# Patient Record
Sex: Female | Born: 1945 | Race: White | Hispanic: No | Smoking: Former smoker
Health system: Southern US, Community
[De-identification: ages and names within clinical notes are randomized; demographics above are authoritative.]

## PROBLEM LIST (undated history)

## (undated) DIAGNOSIS — M109 Gout, unspecified: Secondary | ICD-10-CM

## (undated) DIAGNOSIS — Z8489 Family history of other specified conditions: Secondary | ICD-10-CM

## (undated) DIAGNOSIS — K219 Gastro-esophageal reflux disease without esophagitis: Secondary | ICD-10-CM

## (undated) DIAGNOSIS — E119 Type 2 diabetes mellitus without complications: Secondary | ICD-10-CM

## (undated) DIAGNOSIS — C801 Malignant (primary) neoplasm, unspecified: Secondary | ICD-10-CM

## (undated) DIAGNOSIS — N2889 Other specified disorders of kidney and ureter: Secondary | ICD-10-CM

## (undated) DIAGNOSIS — I509 Heart failure, unspecified: Secondary | ICD-10-CM

## (undated) DIAGNOSIS — R1310 Dysphagia, unspecified: Secondary | ICD-10-CM

## (undated) DIAGNOSIS — K7460 Unspecified cirrhosis of liver: Secondary | ICD-10-CM

## (undated) DIAGNOSIS — M1990 Unspecified osteoarthritis, unspecified site: Secondary | ICD-10-CM

## (undated) DIAGNOSIS — I10 Essential (primary) hypertension: Secondary | ICD-10-CM

## (undated) HISTORY — PX: TUBAL LIGATION: SHX77

## (undated) HISTORY — DX: Gastro-esophageal reflux disease without esophagitis: K21.9

## (undated) HISTORY — PX: CARPAL TUNNEL RELEASE: SHX101

## (undated) HISTORY — PX: ABDOMINAL HYSTERECTOMY: SHX81

## (undated) HISTORY — DX: Heart failure, unspecified: I50.9

## (undated) HISTORY — DX: Dysphagia, unspecified: R13.10

## (undated) HISTORY — DX: Unspecified cirrhosis of liver: K74.60

## (undated) HISTORY — PX: WISDOM TOOTH EXTRACTION: SHX21

## (undated) HISTORY — PX: TRIGGER FINGER RELEASE: SHX641

---

## 2013-02-12 DIAGNOSIS — R7309 Other abnormal glucose: Secondary | ICD-10-CM | POA: Diagnosis not present

## 2013-02-12 DIAGNOSIS — K219 Gastro-esophageal reflux disease without esophagitis: Secondary | ICD-10-CM | POA: Diagnosis not present

## 2013-02-12 DIAGNOSIS — R072 Precordial pain: Secondary | ICD-10-CM | POA: Diagnosis not present

## 2013-02-12 DIAGNOSIS — Z833 Family history of diabetes mellitus: Secondary | ICD-10-CM | POA: Diagnosis not present

## 2013-02-12 DIAGNOSIS — E119 Type 2 diabetes mellitus without complications: Secondary | ICD-10-CM | POA: Insufficient documentation

## 2013-02-12 DIAGNOSIS — Z87891 Personal history of nicotine dependence: Secondary | ICD-10-CM | POA: Diagnosis not present

## 2013-02-12 DIAGNOSIS — Z79899 Other long term (current) drug therapy: Secondary | ICD-10-CM | POA: Diagnosis not present

## 2013-02-12 DIAGNOSIS — I77810 Thoracic aortic ectasia: Secondary | ICD-10-CM | POA: Diagnosis not present

## 2013-02-12 DIAGNOSIS — J9819 Other pulmonary collapse: Secondary | ICD-10-CM | POA: Diagnosis not present

## 2013-02-12 DIAGNOSIS — E785 Hyperlipidemia, unspecified: Secondary | ICD-10-CM | POA: Diagnosis present

## 2013-02-12 DIAGNOSIS — I517 Cardiomegaly: Secondary | ICD-10-CM | POA: Diagnosis not present

## 2013-02-12 DIAGNOSIS — R079 Chest pain, unspecified: Secondary | ICD-10-CM | POA: Diagnosis not present

## 2013-02-12 DIAGNOSIS — R109 Unspecified abdominal pain: Secondary | ICD-10-CM | POA: Diagnosis not present

## 2013-02-12 DIAGNOSIS — E876 Hypokalemia: Secondary | ICD-10-CM | POA: Diagnosis not present

## 2013-02-12 DIAGNOSIS — I200 Unstable angina: Secondary | ICD-10-CM | POA: Diagnosis not present

## 2013-02-12 DIAGNOSIS — R0789 Other chest pain: Secondary | ICD-10-CM | POA: Diagnosis not present

## 2013-02-12 DIAGNOSIS — Z888 Allergy status to other drugs, medicaments and biological substances status: Secondary | ICD-10-CM | POA: Diagnosis not present

## 2013-02-12 DIAGNOSIS — M109 Gout, unspecified: Secondary | ICD-10-CM | POA: Diagnosis not present

## 2013-02-12 DIAGNOSIS — I10 Essential (primary) hypertension: Secondary | ICD-10-CM | POA: Diagnosis not present

## 2013-02-12 DIAGNOSIS — Z8249 Family history of ischemic heart disease and other diseases of the circulatory system: Secondary | ICD-10-CM | POA: Diagnosis not present

## 2013-02-13 DIAGNOSIS — Z9889 Other specified postprocedural states: Secondary | ICD-10-CM | POA: Insufficient documentation

## 2013-02-21 DIAGNOSIS — IMO0002 Reserved for concepts with insufficient information to code with codable children: Secondary | ICD-10-CM | POA: Diagnosis not present

## 2013-02-21 DIAGNOSIS — Z79899 Other long term (current) drug therapy: Secondary | ICD-10-CM | POA: Diagnosis not present

## 2013-02-21 DIAGNOSIS — I10 Essential (primary) hypertension: Secondary | ICD-10-CM | POA: Diagnosis not present

## 2013-02-21 DIAGNOSIS — M109 Gout, unspecified: Secondary | ICD-10-CM | POA: Diagnosis not present

## 2013-02-21 DIAGNOSIS — E782 Mixed hyperlipidemia: Secondary | ICD-10-CM | POA: Diagnosis not present

## 2013-02-21 DIAGNOSIS — M129 Arthropathy, unspecified: Secondary | ICD-10-CM | POA: Diagnosis not present

## 2013-02-21 DIAGNOSIS — M79609 Pain in unspecified limb: Secondary | ICD-10-CM | POA: Diagnosis not present

## 2013-02-21 DIAGNOSIS — R7309 Other abnormal glucose: Secondary | ICD-10-CM | POA: Diagnosis not present

## 2013-03-07 ENCOUNTER — Encounter (HOSPITAL_COMMUNITY): Payer: Self-pay | Admitting: Dietician

## 2013-03-07 NOTE — Progress Notes (Signed)
[facility] Diabetes Class Completion  Date:March 07, 2013  Time: 5:30 PM  Pt attended [facility]'s Diabetes [facility] Education Class on March 07, 2013.   Patient was educated on the following topics: survival skills (signs and symptoms of hyperglycemia and hypoglycemia, treatment for hypoglycemia, ideal levels for fasting and postprandial blood sugars, goal Hgb A1c level, foot care basics), recommendations for physical activity, carbohydrate metabolism in relation to diabetes, and meal planning (sources of carbohydrate, carbohydrate counting, meal planning strategies, food label reading, and portion control).   Jenifer A. Kayan, RD, LDN   

## 2013-03-27 DIAGNOSIS — I10 Essential (primary) hypertension: Secondary | ICD-10-CM | POA: Diagnosis not present

## 2013-03-27 DIAGNOSIS — R002 Palpitations: Secondary | ICD-10-CM | POA: Diagnosis not present

## 2013-07-08 DIAGNOSIS — E782 Mixed hyperlipidemia: Secondary | ICD-10-CM | POA: Diagnosis not present

## 2013-07-08 DIAGNOSIS — I10 Essential (primary) hypertension: Secondary | ICD-10-CM | POA: Diagnosis not present

## 2013-07-11 DIAGNOSIS — M109 Gout, unspecified: Secondary | ICD-10-CM | POA: Diagnosis not present

## 2013-07-11 DIAGNOSIS — I10 Essential (primary) hypertension: Secondary | ICD-10-CM | POA: Diagnosis not present

## 2013-07-11 DIAGNOSIS — IMO0002 Reserved for concepts with insufficient information to code with codable children: Secondary | ICD-10-CM | POA: Diagnosis not present

## 2013-07-11 DIAGNOSIS — E782 Mixed hyperlipidemia: Secondary | ICD-10-CM | POA: Diagnosis not present

## 2013-07-11 DIAGNOSIS — M79609 Pain in unspecified limb: Secondary | ICD-10-CM | POA: Diagnosis not present

## 2013-07-11 DIAGNOSIS — E119 Type 2 diabetes mellitus without complications: Secondary | ICD-10-CM | POA: Diagnosis not present

## 2013-07-18 DIAGNOSIS — Z1231 Encounter for screening mammogram for malignant neoplasm of breast: Secondary | ICD-10-CM | POA: Diagnosis not present

## 2013-10-07 DIAGNOSIS — L280 Lichen simplex chronicus: Secondary | ICD-10-CM | POA: Diagnosis not present

## 2013-10-07 DIAGNOSIS — L821 Other seborrheic keratosis: Secondary | ICD-10-CM | POA: Diagnosis not present

## 2013-10-07 DIAGNOSIS — D485 Neoplasm of uncertain behavior of skin: Secondary | ICD-10-CM | POA: Diagnosis not present

## 2013-10-07 DIAGNOSIS — L259 Unspecified contact dermatitis, unspecified cause: Secondary | ICD-10-CM | POA: Diagnosis not present

## 2013-10-07 DIAGNOSIS — L570 Actinic keratosis: Secondary | ICD-10-CM | POA: Diagnosis not present

## 2013-10-18 DIAGNOSIS — Z23 Encounter for immunization: Secondary | ICD-10-CM | POA: Diagnosis not present

## 2013-11-08 DIAGNOSIS — E119 Type 2 diabetes mellitus without complications: Secondary | ICD-10-CM | POA: Diagnosis not present

## 2013-11-08 DIAGNOSIS — IMO0002 Reserved for concepts with insufficient information to code with codable children: Secondary | ICD-10-CM | POA: Diagnosis not present

## 2013-11-08 DIAGNOSIS — E782 Mixed hyperlipidemia: Secondary | ICD-10-CM | POA: Diagnosis not present

## 2013-11-08 DIAGNOSIS — I10 Essential (primary) hypertension: Secondary | ICD-10-CM | POA: Diagnosis not present

## 2013-11-19 DIAGNOSIS — Z23 Encounter for immunization: Secondary | ICD-10-CM | POA: Diagnosis not present

## 2013-11-19 DIAGNOSIS — M109 Gout, unspecified: Secondary | ICD-10-CM | POA: Diagnosis not present

## 2013-11-19 DIAGNOSIS — E782 Mixed hyperlipidemia: Secondary | ICD-10-CM | POA: Diagnosis not present

## 2013-11-19 DIAGNOSIS — I10 Essential (primary) hypertension: Secondary | ICD-10-CM | POA: Diagnosis not present

## 2013-11-19 DIAGNOSIS — IMO0002 Reserved for concepts with insufficient information to code with codable children: Secondary | ICD-10-CM | POA: Diagnosis not present

## 2013-11-19 DIAGNOSIS — E119 Type 2 diabetes mellitus without complications: Secondary | ICD-10-CM | POA: Diagnosis not present

## 2013-11-19 DIAGNOSIS — Z1331 Encounter for screening for depression: Secondary | ICD-10-CM | POA: Diagnosis not present

## 2013-11-19 DIAGNOSIS — M6530 Trigger finger, unspecified finger: Secondary | ICD-10-CM | POA: Diagnosis not present

## 2014-01-18 DIAGNOSIS — R300 Dysuria: Secondary | ICD-10-CM | POA: Diagnosis not present

## 2014-01-18 DIAGNOSIS — N3000 Acute cystitis without hematuria: Secondary | ICD-10-CM | POA: Diagnosis not present

## 2014-01-18 DIAGNOSIS — R109 Unspecified abdominal pain: Secondary | ICD-10-CM | POA: Diagnosis not present

## 2014-02-10 DIAGNOSIS — Z1211 Encounter for screening for malignant neoplasm of colon: Secondary | ICD-10-CM | POA: Diagnosis not present

## 2014-02-20 DIAGNOSIS — Z888 Allergy status to other drugs, medicaments and biological substances status: Secondary | ICD-10-CM | POA: Diagnosis not present

## 2014-02-20 DIAGNOSIS — Z885 Allergy status to narcotic agent status: Secondary | ICD-10-CM | POA: Diagnosis not present

## 2014-02-20 DIAGNOSIS — Z1211 Encounter for screening for malignant neoplasm of colon: Secondary | ICD-10-CM | POA: Diagnosis not present

## 2014-02-20 DIAGNOSIS — Z882 Allergy status to sulfonamides status: Secondary | ICD-10-CM | POA: Diagnosis not present

## 2014-02-20 DIAGNOSIS — K644 Residual hemorrhoidal skin tags: Secondary | ICD-10-CM | POA: Diagnosis not present

## 2014-02-20 DIAGNOSIS — Z79899 Other long term (current) drug therapy: Secondary | ICD-10-CM | POA: Diagnosis not present

## 2014-02-20 DIAGNOSIS — I10 Essential (primary) hypertension: Secondary | ICD-10-CM | POA: Diagnosis not present

## 2014-02-20 DIAGNOSIS — D126 Benign neoplasm of colon, unspecified: Secondary | ICD-10-CM | POA: Diagnosis not present

## 2014-02-20 DIAGNOSIS — Q438 Other specified congenital malformations of intestine: Secondary | ICD-10-CM | POA: Diagnosis not present

## 2014-02-20 DIAGNOSIS — E119 Type 2 diabetes mellitus without complications: Secondary | ICD-10-CM | POA: Diagnosis not present

## 2014-02-20 DIAGNOSIS — M109 Gout, unspecified: Secondary | ICD-10-CM | POA: Diagnosis not present

## 2014-02-20 DIAGNOSIS — K5730 Diverticulosis of large intestine without perforation or abscess without bleeding: Secondary | ICD-10-CM | POA: Diagnosis not present

## 2014-03-25 DIAGNOSIS — Z1331 Encounter for screening for depression: Secondary | ICD-10-CM | POA: Diagnosis not present

## 2014-03-25 DIAGNOSIS — E119 Type 2 diabetes mellitus without complications: Secondary | ICD-10-CM | POA: Diagnosis not present

## 2014-03-25 DIAGNOSIS — IMO0002 Reserved for concepts with insufficient information to code with codable children: Secondary | ICD-10-CM | POA: Diagnosis not present

## 2014-03-25 DIAGNOSIS — I10 Essential (primary) hypertension: Secondary | ICD-10-CM | POA: Diagnosis not present

## 2014-03-25 DIAGNOSIS — M109 Gout, unspecified: Secondary | ICD-10-CM | POA: Diagnosis not present

## 2014-03-25 DIAGNOSIS — Z23 Encounter for immunization: Secondary | ICD-10-CM | POA: Diagnosis not present

## 2014-03-25 DIAGNOSIS — E782 Mixed hyperlipidemia: Secondary | ICD-10-CM | POA: Diagnosis not present

## 2014-03-25 DIAGNOSIS — Z0000 Encounter for general adult medical examination without abnormal findings: Secondary | ICD-10-CM | POA: Diagnosis not present

## 2014-03-25 DIAGNOSIS — M6530 Trigger finger, unspecified finger: Secondary | ICD-10-CM | POA: Diagnosis not present

## 2014-05-31 DIAGNOSIS — R300 Dysuria: Secondary | ICD-10-CM | POA: Diagnosis not present

## 2014-08-01 DIAGNOSIS — Z1231 Encounter for screening mammogram for malignant neoplasm of breast: Secondary | ICD-10-CM | POA: Diagnosis not present

## 2014-08-07 DIAGNOSIS — L259 Unspecified contact dermatitis, unspecified cause: Secondary | ICD-10-CM | POA: Diagnosis not present

## 2014-08-07 DIAGNOSIS — I10 Essential (primary) hypertension: Secondary | ICD-10-CM | POA: Diagnosis not present

## 2014-08-11 DIAGNOSIS — E782 Mixed hyperlipidemia: Secondary | ICD-10-CM | POA: Diagnosis not present

## 2014-08-11 DIAGNOSIS — N3000 Acute cystitis without hematuria: Secondary | ICD-10-CM | POA: Diagnosis not present

## 2014-08-11 DIAGNOSIS — E119 Type 2 diabetes mellitus without complications: Secondary | ICD-10-CM | POA: Diagnosis not present

## 2014-08-11 DIAGNOSIS — I10 Essential (primary) hypertension: Secondary | ICD-10-CM | POA: Diagnosis not present

## 2014-08-19 DIAGNOSIS — E119 Type 2 diabetes mellitus without complications: Secondary | ICD-10-CM | POA: Diagnosis not present

## 2014-08-19 DIAGNOSIS — E782 Mixed hyperlipidemia: Secondary | ICD-10-CM | POA: Diagnosis not present

## 2014-08-19 DIAGNOSIS — Z0000 Encounter for general adult medical examination without abnormal findings: Secondary | ICD-10-CM | POA: Diagnosis not present

## 2014-08-19 DIAGNOSIS — I10 Essential (primary) hypertension: Secondary | ICD-10-CM | POA: Diagnosis not present

## 2014-08-19 DIAGNOSIS — Z1331 Encounter for screening for depression: Secondary | ICD-10-CM | POA: Diagnosis not present

## 2014-08-19 DIAGNOSIS — IMO0002 Reserved for concepts with insufficient information to code with codable children: Secondary | ICD-10-CM | POA: Diagnosis not present

## 2014-08-19 DIAGNOSIS — M109 Gout, unspecified: Secondary | ICD-10-CM | POA: Diagnosis not present

## 2014-09-11 DIAGNOSIS — M899 Disorder of bone, unspecified: Secondary | ICD-10-CM | POA: Diagnosis not present

## 2014-09-11 DIAGNOSIS — M949 Disorder of cartilage, unspecified: Secondary | ICD-10-CM | POA: Diagnosis not present

## 2014-09-11 DIAGNOSIS — Z780 Asymptomatic menopausal state: Secondary | ICD-10-CM | POA: Diagnosis not present

## 2014-10-07 DIAGNOSIS — L280 Lichen simplex chronicus: Secondary | ICD-10-CM | POA: Diagnosis not present

## 2014-10-07 DIAGNOSIS — L570 Actinic keratosis: Secondary | ICD-10-CM | POA: Diagnosis not present

## 2014-12-17 DIAGNOSIS — E782 Mixed hyperlipidemia: Secondary | ICD-10-CM | POA: Diagnosis not present

## 2014-12-17 DIAGNOSIS — I10 Essential (primary) hypertension: Secondary | ICD-10-CM | POA: Diagnosis not present

## 2014-12-17 DIAGNOSIS — N3090 Cystitis, unspecified without hematuria: Secondary | ICD-10-CM | POA: Diagnosis not present

## 2014-12-17 DIAGNOSIS — E119 Type 2 diabetes mellitus without complications: Secondary | ICD-10-CM | POA: Diagnosis not present

## 2014-12-24 DIAGNOSIS — E782 Mixed hyperlipidemia: Secondary | ICD-10-CM | POA: Diagnosis not present

## 2014-12-24 DIAGNOSIS — M1000 Idiopathic gout, unspecified site: Secondary | ICD-10-CM | POA: Diagnosis not present

## 2014-12-24 DIAGNOSIS — Z1389 Encounter for screening for other disorder: Secondary | ICD-10-CM | POA: Diagnosis not present

## 2014-12-24 DIAGNOSIS — G43909 Migraine, unspecified, not intractable, without status migrainosus: Secondary | ICD-10-CM | POA: Diagnosis not present

## 2014-12-24 DIAGNOSIS — Z23 Encounter for immunization: Secondary | ICD-10-CM | POA: Diagnosis not present

## 2014-12-24 DIAGNOSIS — F331 Major depressive disorder, recurrent, moderate: Secondary | ICD-10-CM | POA: Diagnosis not present

## 2014-12-24 DIAGNOSIS — I10 Essential (primary) hypertension: Secondary | ICD-10-CM | POA: Diagnosis not present

## 2014-12-24 DIAGNOSIS — E119 Type 2 diabetes mellitus without complications: Secondary | ICD-10-CM | POA: Diagnosis not present

## 2015-02-06 DIAGNOSIS — J019 Acute sinusitis, unspecified: Secondary | ICD-10-CM | POA: Diagnosis not present

## 2015-04-15 DIAGNOSIS — F331 Major depressive disorder, recurrent, moderate: Secondary | ICD-10-CM | POA: Diagnosis not present

## 2015-04-15 DIAGNOSIS — I10 Essential (primary) hypertension: Secondary | ICD-10-CM | POA: Diagnosis not present

## 2015-04-15 DIAGNOSIS — E782 Mixed hyperlipidemia: Secondary | ICD-10-CM | POA: Diagnosis not present

## 2015-04-15 DIAGNOSIS — E119 Type 2 diabetes mellitus without complications: Secondary | ICD-10-CM | POA: Diagnosis not present

## 2015-04-15 DIAGNOSIS — N3090 Cystitis, unspecified without hematuria: Secondary | ICD-10-CM | POA: Diagnosis not present

## 2015-04-22 DIAGNOSIS — F331 Major depressive disorder, recurrent, moderate: Secondary | ICD-10-CM | POA: Diagnosis not present

## 2015-04-22 DIAGNOSIS — R202 Paresthesia of skin: Secondary | ICD-10-CM | POA: Diagnosis not present

## 2015-04-22 DIAGNOSIS — M1000 Idiopathic gout, unspecified site: Secondary | ICD-10-CM | POA: Diagnosis not present

## 2015-04-22 DIAGNOSIS — E119 Type 2 diabetes mellitus without complications: Secondary | ICD-10-CM | POA: Diagnosis not present

## 2015-04-22 DIAGNOSIS — I10 Essential (primary) hypertension: Secondary | ICD-10-CM | POA: Diagnosis not present

## 2015-04-22 DIAGNOSIS — G43909 Migraine, unspecified, not intractable, without status migrainosus: Secondary | ICD-10-CM | POA: Diagnosis not present

## 2015-04-22 DIAGNOSIS — E782 Mixed hyperlipidemia: Secondary | ICD-10-CM | POA: Diagnosis not present

## 2015-04-28 DIAGNOSIS — E119 Type 2 diabetes mellitus without complications: Secondary | ICD-10-CM | POA: Diagnosis not present

## 2015-04-28 DIAGNOSIS — R202 Paresthesia of skin: Secondary | ICD-10-CM | POA: Diagnosis not present

## 2015-04-28 DIAGNOSIS — Z9851 Tubal ligation status: Secondary | ICD-10-CM | POA: Diagnosis not present

## 2015-04-28 DIAGNOSIS — Z90710 Acquired absence of both cervix and uterus: Secondary | ICD-10-CM | POA: Diagnosis not present

## 2015-04-28 DIAGNOSIS — G9389 Other specified disorders of brain: Secondary | ICD-10-CM | POA: Diagnosis not present

## 2015-04-28 DIAGNOSIS — R200 Anesthesia of skin: Secondary | ICD-10-CM | POA: Diagnosis not present

## 2015-04-28 DIAGNOSIS — J9811 Atelectasis: Secondary | ICD-10-CM | POA: Diagnosis not present

## 2015-04-28 DIAGNOSIS — M109 Gout, unspecified: Secondary | ICD-10-CM | POA: Diagnosis not present

## 2015-04-28 DIAGNOSIS — F419 Anxiety disorder, unspecified: Secondary | ICD-10-CM | POA: Diagnosis not present

## 2015-04-28 DIAGNOSIS — Z79899 Other long term (current) drug therapy: Secondary | ICD-10-CM | POA: Diagnosis not present

## 2015-04-28 DIAGNOSIS — R29810 Facial weakness: Secondary | ICD-10-CM | POA: Diagnosis not present

## 2015-04-28 DIAGNOSIS — I708 Atherosclerosis of other arteries: Secondary | ICD-10-CM | POA: Diagnosis not present

## 2015-04-28 DIAGNOSIS — J986 Disorders of diaphragm: Secondary | ICD-10-CM | POA: Diagnosis not present

## 2015-04-28 DIAGNOSIS — R1310 Dysphagia, unspecified: Secondary | ICD-10-CM | POA: Diagnosis not present

## 2015-04-28 DIAGNOSIS — I10 Essential (primary) hypertension: Secondary | ICD-10-CM | POA: Diagnosis not present

## 2015-04-30 DIAGNOSIS — F331 Major depressive disorder, recurrent, moderate: Secondary | ICD-10-CM | POA: Diagnosis not present

## 2015-05-05 DIAGNOSIS — I6522 Occlusion and stenosis of left carotid artery: Secondary | ICD-10-CM | POA: Diagnosis not present

## 2015-05-05 DIAGNOSIS — E119 Type 2 diabetes mellitus without complications: Secondary | ICD-10-CM | POA: Diagnosis not present

## 2015-05-05 DIAGNOSIS — I10 Essential (primary) hypertension: Secondary | ICD-10-CM | POA: Diagnosis not present

## 2015-05-05 DIAGNOSIS — R200 Anesthesia of skin: Secondary | ICD-10-CM | POA: Diagnosis not present

## 2015-05-13 DIAGNOSIS — I071 Rheumatic tricuspid insufficiency: Secondary | ICD-10-CM | POA: Diagnosis not present

## 2015-05-13 DIAGNOSIS — I361 Nonrheumatic tricuspid (valve) insufficiency: Secondary | ICD-10-CM | POA: Diagnosis not present

## 2015-05-13 DIAGNOSIS — I340 Nonrheumatic mitral (valve) insufficiency: Secondary | ICD-10-CM | POA: Diagnosis not present

## 2015-05-13 DIAGNOSIS — R200 Anesthesia of skin: Secondary | ICD-10-CM | POA: Diagnosis not present

## 2015-05-13 DIAGNOSIS — R931 Abnormal findings on diagnostic imaging of heart and coronary circulation: Secondary | ICD-10-CM | POA: Diagnosis not present

## 2015-05-13 DIAGNOSIS — R51 Headache: Secondary | ICD-10-CM | POA: Diagnosis not present

## 2015-05-13 DIAGNOSIS — I358 Other nonrheumatic aortic valve disorders: Secondary | ICD-10-CM | POA: Diagnosis not present

## 2015-05-20 DIAGNOSIS — I10 Essential (primary) hypertension: Secondary | ICD-10-CM | POA: Diagnosis not present

## 2015-05-20 DIAGNOSIS — G43909 Migraine, unspecified, not intractable, without status migrainosus: Secondary | ICD-10-CM | POA: Diagnosis not present

## 2015-05-20 DIAGNOSIS — F331 Major depressive disorder, recurrent, moderate: Secondary | ICD-10-CM | POA: Diagnosis not present

## 2015-05-20 DIAGNOSIS — E782 Mixed hyperlipidemia: Secondary | ICD-10-CM | POA: Diagnosis not present

## 2015-08-21 DIAGNOSIS — F331 Major depressive disorder, recurrent, moderate: Secondary | ICD-10-CM | POA: Diagnosis not present

## 2015-08-21 DIAGNOSIS — E782 Mixed hyperlipidemia: Secondary | ICD-10-CM | POA: Diagnosis not present

## 2015-08-21 DIAGNOSIS — N3090 Cystitis, unspecified without hematuria: Secondary | ICD-10-CM | POA: Diagnosis not present

## 2015-08-21 DIAGNOSIS — I10 Essential (primary) hypertension: Secondary | ICD-10-CM | POA: Diagnosis not present

## 2015-08-21 DIAGNOSIS — E119 Type 2 diabetes mellitus without complications: Secondary | ICD-10-CM | POA: Diagnosis not present

## 2015-08-26 DIAGNOSIS — Z1389 Encounter for screening for other disorder: Secondary | ICD-10-CM | POA: Diagnosis not present

## 2015-08-26 DIAGNOSIS — E119 Type 2 diabetes mellitus without complications: Secondary | ICD-10-CM | POA: Diagnosis not present

## 2015-08-26 DIAGNOSIS — I10 Essential (primary) hypertension: Secondary | ICD-10-CM | POA: Diagnosis not present

## 2015-08-26 DIAGNOSIS — Z23 Encounter for immunization: Secondary | ICD-10-CM | POA: Diagnosis not present

## 2015-08-26 DIAGNOSIS — Z0001 Encounter for general adult medical examination with abnormal findings: Secondary | ICD-10-CM | POA: Diagnosis not present

## 2015-08-26 DIAGNOSIS — G43909 Migraine, unspecified, not intractable, without status migrainosus: Secondary | ICD-10-CM | POA: Diagnosis not present

## 2015-08-26 DIAGNOSIS — E782 Mixed hyperlipidemia: Secondary | ICD-10-CM | POA: Diagnosis not present

## 2015-08-26 DIAGNOSIS — F331 Major depressive disorder, recurrent, moderate: Secondary | ICD-10-CM | POA: Diagnosis not present

## 2015-09-01 DIAGNOSIS — Z1231 Encounter for screening mammogram for malignant neoplasm of breast: Secondary | ICD-10-CM | POA: Diagnosis not present

## 2015-10-06 DIAGNOSIS — L280 Lichen simplex chronicus: Secondary | ICD-10-CM | POA: Diagnosis not present

## 2015-10-06 DIAGNOSIS — L570 Actinic keratosis: Secondary | ICD-10-CM | POA: Diagnosis not present

## 2015-12-31 DIAGNOSIS — E119 Type 2 diabetes mellitus without complications: Secondary | ICD-10-CM | POA: Diagnosis not present

## 2015-12-31 DIAGNOSIS — I10 Essential (primary) hypertension: Secondary | ICD-10-CM | POA: Diagnosis not present

## 2015-12-31 DIAGNOSIS — E782 Mixed hyperlipidemia: Secondary | ICD-10-CM | POA: Diagnosis not present

## 2016-01-04 DIAGNOSIS — Z9189 Other specified personal risk factors, not elsewhere classified: Secondary | ICD-10-CM | POA: Diagnosis not present

## 2016-01-04 DIAGNOSIS — M1000 Idiopathic gout, unspecified site: Secondary | ICD-10-CM | POA: Diagnosis not present

## 2016-01-04 DIAGNOSIS — E782 Mixed hyperlipidemia: Secondary | ICD-10-CM | POA: Diagnosis not present

## 2016-01-04 DIAGNOSIS — K7581 Nonalcoholic steatohepatitis (NASH): Secondary | ICD-10-CM | POA: Diagnosis not present

## 2016-01-04 DIAGNOSIS — G43909 Migraine, unspecified, not intractable, without status migrainosus: Secondary | ICD-10-CM | POA: Diagnosis not present

## 2016-01-04 DIAGNOSIS — F331 Major depressive disorder, recurrent, moderate: Secondary | ICD-10-CM | POA: Diagnosis not present

## 2016-01-04 DIAGNOSIS — E119 Type 2 diabetes mellitus without complications: Secondary | ICD-10-CM | POA: Diagnosis not present

## 2016-01-04 DIAGNOSIS — Z1389 Encounter for screening for other disorder: Secondary | ICD-10-CM | POA: Diagnosis not present

## 2016-01-04 DIAGNOSIS — I10 Essential (primary) hypertension: Secondary | ICD-10-CM | POA: Diagnosis not present

## 2016-04-25 DIAGNOSIS — R062 Wheezing: Secondary | ICD-10-CM | POA: Diagnosis not present

## 2016-04-25 DIAGNOSIS — R05 Cough: Secondary | ICD-10-CM | POA: Diagnosis not present

## 2016-09-02 DIAGNOSIS — F331 Major depressive disorder, recurrent, moderate: Secondary | ICD-10-CM | POA: Diagnosis not present

## 2016-09-02 DIAGNOSIS — K7581 Nonalcoholic steatohepatitis (NASH): Secondary | ICD-10-CM | POA: Diagnosis not present

## 2016-09-02 DIAGNOSIS — I10 Essential (primary) hypertension: Secondary | ICD-10-CM | POA: Diagnosis not present

## 2016-09-02 DIAGNOSIS — M1000 Idiopathic gout, unspecified site: Secondary | ICD-10-CM | POA: Diagnosis not present

## 2016-09-02 DIAGNOSIS — E119 Type 2 diabetes mellitus without complications: Secondary | ICD-10-CM | POA: Diagnosis not present

## 2016-09-02 DIAGNOSIS — E782 Mixed hyperlipidemia: Secondary | ICD-10-CM | POA: Diagnosis not present

## 2016-09-06 DIAGNOSIS — Z1212 Encounter for screening for malignant neoplasm of rectum: Secondary | ICD-10-CM | POA: Diagnosis not present

## 2016-09-06 DIAGNOSIS — M1000 Idiopathic gout, unspecified site: Secondary | ICD-10-CM | POA: Diagnosis not present

## 2016-09-06 DIAGNOSIS — E782 Mixed hyperlipidemia: Secondary | ICD-10-CM | POA: Diagnosis not present

## 2016-09-06 DIAGNOSIS — Z6828 Body mass index (BMI) 28.0-28.9, adult: Secondary | ICD-10-CM | POA: Diagnosis not present

## 2016-09-06 DIAGNOSIS — F331 Major depressive disorder, recurrent, moderate: Secondary | ICD-10-CM | POA: Diagnosis not present

## 2016-09-06 DIAGNOSIS — Z0001 Encounter for general adult medical examination with abnormal findings: Secondary | ICD-10-CM | POA: Diagnosis not present

## 2016-09-06 DIAGNOSIS — Z23 Encounter for immunization: Secondary | ICD-10-CM | POA: Diagnosis not present

## 2016-09-06 DIAGNOSIS — E119 Type 2 diabetes mellitus without complications: Secondary | ICD-10-CM | POA: Diagnosis not present

## 2016-09-08 DIAGNOSIS — R921 Mammographic calcification found on diagnostic imaging of breast: Secondary | ICD-10-CM | POA: Diagnosis not present

## 2016-09-08 DIAGNOSIS — Z1231 Encounter for screening mammogram for malignant neoplasm of breast: Secondary | ICD-10-CM | POA: Diagnosis not present

## 2016-09-28 DIAGNOSIS — R922 Inconclusive mammogram: Secondary | ICD-10-CM | POA: Diagnosis not present

## 2016-09-28 DIAGNOSIS — R921 Mammographic calcification found on diagnostic imaging of breast: Secondary | ICD-10-CM | POA: Diagnosis not present

## 2016-10-05 DIAGNOSIS — L570 Actinic keratosis: Secondary | ICD-10-CM | POA: Diagnosis not present

## 2016-10-05 DIAGNOSIS — L710 Perioral dermatitis: Secondary | ICD-10-CM | POA: Diagnosis not present

## 2016-10-24 DIAGNOSIS — Z6828 Body mass index (BMI) 28.0-28.9, adult: Secondary | ICD-10-CM | POA: Diagnosis not present

## 2016-10-24 DIAGNOSIS — M7552 Bursitis of left shoulder: Secondary | ICD-10-CM | POA: Diagnosis not present

## 2016-10-26 DIAGNOSIS — E119 Type 2 diabetes mellitus without complications: Secondary | ICD-10-CM | POA: Diagnosis not present

## 2016-10-26 DIAGNOSIS — E7800 Pure hypercholesterolemia, unspecified: Secondary | ICD-10-CM | POA: Diagnosis not present

## 2016-10-26 DIAGNOSIS — M85852 Other specified disorders of bone density and structure, left thigh: Secondary | ICD-10-CM | POA: Diagnosis not present

## 2016-10-26 DIAGNOSIS — Z780 Asymptomatic menopausal state: Secondary | ICD-10-CM | POA: Diagnosis not present

## 2016-10-26 DIAGNOSIS — I10 Essential (primary) hypertension: Secondary | ICD-10-CM | POA: Diagnosis not present

## 2016-10-26 DIAGNOSIS — M109 Gout, unspecified: Secondary | ICD-10-CM | POA: Diagnosis not present

## 2016-10-26 DIAGNOSIS — M85851 Other specified disorders of bone density and structure, right thigh: Secondary | ICD-10-CM | POA: Diagnosis not present

## 2016-10-26 DIAGNOSIS — Z79899 Other long term (current) drug therapy: Secondary | ICD-10-CM | POA: Diagnosis not present

## 2016-10-26 DIAGNOSIS — Z7984 Long term (current) use of oral hypoglycemic drugs: Secondary | ICD-10-CM | POA: Diagnosis not present

## 2016-11-03 DIAGNOSIS — M25512 Pain in left shoulder: Secondary | ICD-10-CM | POA: Diagnosis not present

## 2016-11-07 DIAGNOSIS — M25512 Pain in left shoulder: Secondary | ICD-10-CM | POA: Diagnosis not present

## 2016-11-09 DIAGNOSIS — M25512 Pain in left shoulder: Secondary | ICD-10-CM | POA: Diagnosis not present

## 2016-11-14 DIAGNOSIS — M25512 Pain in left shoulder: Secondary | ICD-10-CM | POA: Diagnosis not present

## 2016-11-16 DIAGNOSIS — M25512 Pain in left shoulder: Secondary | ICD-10-CM | POA: Diagnosis not present

## 2016-11-21 DIAGNOSIS — M25512 Pain in left shoulder: Secondary | ICD-10-CM | POA: Diagnosis not present

## 2016-11-23 DIAGNOSIS — M25512 Pain in left shoulder: Secondary | ICD-10-CM | POA: Diagnosis not present

## 2016-11-25 DIAGNOSIS — M25512 Pain in left shoulder: Secondary | ICD-10-CM | POA: Diagnosis not present

## 2016-11-28 DIAGNOSIS — M25512 Pain in left shoulder: Secondary | ICD-10-CM | POA: Diagnosis not present

## 2016-11-30 DIAGNOSIS — I10 Essential (primary) hypertension: Secondary | ICD-10-CM | POA: Diagnosis not present

## 2016-11-30 DIAGNOSIS — M7552 Bursitis of left shoulder: Secondary | ICD-10-CM | POA: Diagnosis not present

## 2016-11-30 DIAGNOSIS — M25512 Pain in left shoulder: Secondary | ICD-10-CM | POA: Diagnosis not present

## 2016-11-30 DIAGNOSIS — Z6828 Body mass index (BMI) 28.0-28.9, adult: Secondary | ICD-10-CM | POA: Diagnosis not present

## 2016-12-02 DIAGNOSIS — M25512 Pain in left shoulder: Secondary | ICD-10-CM | POA: Diagnosis not present

## 2016-12-07 DIAGNOSIS — M25512 Pain in left shoulder: Secondary | ICD-10-CM | POA: Diagnosis not present

## 2016-12-09 DIAGNOSIS — M25512 Pain in left shoulder: Secondary | ICD-10-CM | POA: Diagnosis not present

## 2016-12-13 DIAGNOSIS — M25512 Pain in left shoulder: Secondary | ICD-10-CM | POA: Diagnosis not present

## 2016-12-16 DIAGNOSIS — M25512 Pain in left shoulder: Secondary | ICD-10-CM | POA: Diagnosis not present

## 2016-12-22 DIAGNOSIS — M25512 Pain in left shoulder: Secondary | ICD-10-CM | POA: Diagnosis not present

## 2016-12-23 DIAGNOSIS — M25512 Pain in left shoulder: Secondary | ICD-10-CM | POA: Diagnosis not present

## 2017-01-03 DIAGNOSIS — E782 Mixed hyperlipidemia: Secondary | ICD-10-CM | POA: Diagnosis not present

## 2017-01-03 DIAGNOSIS — E119 Type 2 diabetes mellitus without complications: Secondary | ICD-10-CM | POA: Diagnosis not present

## 2017-01-03 DIAGNOSIS — I10 Essential (primary) hypertension: Secondary | ICD-10-CM | POA: Diagnosis not present

## 2017-01-06 DIAGNOSIS — E782 Mixed hyperlipidemia: Secondary | ICD-10-CM | POA: Diagnosis not present

## 2017-01-06 DIAGNOSIS — M1000 Idiopathic gout, unspecified site: Secondary | ICD-10-CM | POA: Diagnosis not present

## 2017-01-06 DIAGNOSIS — K7581 Nonalcoholic steatohepatitis (NASH): Secondary | ICD-10-CM | POA: Diagnosis not present

## 2017-01-06 DIAGNOSIS — I10 Essential (primary) hypertension: Secondary | ICD-10-CM | POA: Diagnosis not present

## 2017-01-06 DIAGNOSIS — F331 Major depressive disorder, recurrent, moderate: Secondary | ICD-10-CM | POA: Diagnosis not present

## 2017-01-06 DIAGNOSIS — G43909 Migraine, unspecified, not intractable, without status migrainosus: Secondary | ICD-10-CM | POA: Diagnosis not present

## 2017-01-06 DIAGNOSIS — Z6828 Body mass index (BMI) 28.0-28.9, adult: Secondary | ICD-10-CM | POA: Diagnosis not present

## 2017-01-06 DIAGNOSIS — E119 Type 2 diabetes mellitus without complications: Secondary | ICD-10-CM | POA: Diagnosis not present

## 2017-01-23 DIAGNOSIS — Z6828 Body mass index (BMI) 28.0-28.9, adult: Secondary | ICD-10-CM | POA: Diagnosis not present

## 2017-01-23 DIAGNOSIS — J0101 Acute recurrent maxillary sinusitis: Secondary | ICD-10-CM | POA: Diagnosis not present

## 2017-01-23 DIAGNOSIS — R05 Cough: Secondary | ICD-10-CM | POA: Diagnosis not present

## 2017-03-16 DIAGNOSIS — M7712 Lateral epicondylitis, left elbow: Secondary | ICD-10-CM | POA: Diagnosis not present

## 2017-03-16 DIAGNOSIS — I10 Essential (primary) hypertension: Secondary | ICD-10-CM | POA: Diagnosis not present

## 2017-03-16 DIAGNOSIS — Z6828 Body mass index (BMI) 28.0-28.9, adult: Secondary | ICD-10-CM | POA: Diagnosis not present

## 2017-03-16 DIAGNOSIS — G459 Transient cerebral ischemic attack, unspecified: Secondary | ICD-10-CM | POA: Diagnosis not present

## 2017-03-24 DIAGNOSIS — Z823 Family history of stroke: Secondary | ICD-10-CM | POA: Diagnosis not present

## 2017-03-24 DIAGNOSIS — R200 Anesthesia of skin: Secondary | ICD-10-CM | POA: Diagnosis not present

## 2017-03-24 DIAGNOSIS — R202 Paresthesia of skin: Secondary | ICD-10-CM | POA: Diagnosis not present

## 2017-03-24 DIAGNOSIS — I10 Essential (primary) hypertension: Secondary | ICD-10-CM | POA: Diagnosis not present

## 2017-03-24 DIAGNOSIS — Z7902 Long term (current) use of antithrombotics/antiplatelets: Secondary | ICD-10-CM | POA: Diagnosis not present

## 2017-03-24 DIAGNOSIS — E1165 Type 2 diabetes mellitus with hyperglycemia: Secondary | ICD-10-CM | POA: Diagnosis not present

## 2017-03-24 DIAGNOSIS — Z7984 Long term (current) use of oral hypoglycemic drugs: Secondary | ICD-10-CM | POA: Diagnosis not present

## 2017-03-24 DIAGNOSIS — R51 Headache: Secondary | ICD-10-CM | POA: Diagnosis not present

## 2017-03-24 DIAGNOSIS — Z87891 Personal history of nicotine dependence: Secondary | ICD-10-CM | POA: Diagnosis not present

## 2017-03-24 DIAGNOSIS — Z8249 Family history of ischemic heart disease and other diseases of the circulatory system: Secondary | ICD-10-CM | POA: Diagnosis not present

## 2017-03-24 DIAGNOSIS — Z79899 Other long term (current) drug therapy: Secondary | ICD-10-CM | POA: Diagnosis not present

## 2017-05-08 DIAGNOSIS — Z9189 Other specified personal risk factors, not elsewhere classified: Secondary | ICD-10-CM | POA: Diagnosis not present

## 2017-05-08 DIAGNOSIS — E119 Type 2 diabetes mellitus without complications: Secondary | ICD-10-CM | POA: Diagnosis not present

## 2017-05-08 DIAGNOSIS — R5383 Other fatigue: Secondary | ICD-10-CM | POA: Diagnosis not present

## 2017-05-08 DIAGNOSIS — I10 Essential (primary) hypertension: Secondary | ICD-10-CM | POA: Diagnosis not present

## 2017-05-08 DIAGNOSIS — E782 Mixed hyperlipidemia: Secondary | ICD-10-CM | POA: Diagnosis not present

## 2017-05-11 DIAGNOSIS — G43909 Migraine, unspecified, not intractable, without status migrainosus: Secondary | ICD-10-CM | POA: Diagnosis not present

## 2017-05-11 DIAGNOSIS — K7581 Nonalcoholic steatohepatitis (NASH): Secondary | ICD-10-CM | POA: Diagnosis not present

## 2017-05-11 DIAGNOSIS — M1000 Idiopathic gout, unspecified site: Secondary | ICD-10-CM | POA: Diagnosis not present

## 2017-05-11 DIAGNOSIS — F331 Major depressive disorder, recurrent, moderate: Secondary | ICD-10-CM | POA: Diagnosis not present

## 2017-05-11 DIAGNOSIS — E119 Type 2 diabetes mellitus without complications: Secondary | ICD-10-CM | POA: Diagnosis not present

## 2017-05-11 DIAGNOSIS — I10 Essential (primary) hypertension: Secondary | ICD-10-CM | POA: Diagnosis not present

## 2017-05-11 DIAGNOSIS — Z1389 Encounter for screening for other disorder: Secondary | ICD-10-CM | POA: Diagnosis not present

## 2017-05-11 DIAGNOSIS — E782 Mixed hyperlipidemia: Secondary | ICD-10-CM | POA: Diagnosis not present

## 2017-07-31 DIAGNOSIS — R072 Precordial pain: Secondary | ICD-10-CM | POA: Diagnosis not present

## 2017-07-31 DIAGNOSIS — R0602 Shortness of breath: Secondary | ICD-10-CM | POA: Diagnosis not present

## 2017-07-31 DIAGNOSIS — I10 Essential (primary) hypertension: Secondary | ICD-10-CM | POA: Diagnosis not present

## 2017-07-31 DIAGNOSIS — Z6828 Body mass index (BMI) 28.0-28.9, adult: Secondary | ICD-10-CM | POA: Diagnosis not present

## 2017-08-04 DIAGNOSIS — R079 Chest pain, unspecified: Secondary | ICD-10-CM | POA: Diagnosis not present

## 2017-08-04 DIAGNOSIS — R0602 Shortness of breath: Secondary | ICD-10-CM | POA: Diagnosis not present

## 2017-08-04 DIAGNOSIS — R072 Precordial pain: Secondary | ICD-10-CM | POA: Diagnosis not present

## 2017-09-01 DIAGNOSIS — Z6828 Body mass index (BMI) 28.0-28.9, adult: Secondary | ICD-10-CM | POA: Diagnosis not present

## 2017-09-01 DIAGNOSIS — L03311 Cellulitis of abdominal wall: Secondary | ICD-10-CM | POA: Diagnosis not present

## 2017-09-07 DIAGNOSIS — Z720 Tobacco use: Secondary | ICD-10-CM | POA: Diagnosis not present

## 2017-09-07 DIAGNOSIS — Z6828 Body mass index (BMI) 28.0-28.9, adult: Secondary | ICD-10-CM | POA: Diagnosis not present

## 2017-09-07 DIAGNOSIS — F331 Major depressive disorder, recurrent, moderate: Secondary | ICD-10-CM | POA: Diagnosis not present

## 2017-09-07 DIAGNOSIS — Z9189 Other specified personal risk factors, not elsewhere classified: Secondary | ICD-10-CM | POA: Diagnosis not present

## 2017-09-07 DIAGNOSIS — E119 Type 2 diabetes mellitus without complications: Secondary | ICD-10-CM | POA: Diagnosis not present

## 2017-09-07 DIAGNOSIS — I10 Essential (primary) hypertension: Secondary | ICD-10-CM | POA: Diagnosis not present

## 2017-09-07 DIAGNOSIS — E782 Mixed hyperlipidemia: Secondary | ICD-10-CM | POA: Diagnosis not present

## 2017-09-07 DIAGNOSIS — W57XXXA Bitten or stung by nonvenomous insect and other nonvenomous arthropods, initial encounter: Secondary | ICD-10-CM | POA: Diagnosis not present

## 2017-09-07 DIAGNOSIS — Z0001 Encounter for general adult medical examination with abnormal findings: Secondary | ICD-10-CM | POA: Diagnosis not present

## 2017-09-11 DIAGNOSIS — Z6828 Body mass index (BMI) 28.0-28.9, adult: Secondary | ICD-10-CM | POA: Diagnosis not present

## 2017-09-11 DIAGNOSIS — Z1212 Encounter for screening for malignant neoplasm of rectum: Secondary | ICD-10-CM | POA: Diagnosis not present

## 2017-09-11 DIAGNOSIS — Z23 Encounter for immunization: Secondary | ICD-10-CM | POA: Diagnosis not present

## 2017-09-11 DIAGNOSIS — M1000 Idiopathic gout, unspecified site: Secondary | ICD-10-CM | POA: Diagnosis not present

## 2017-09-11 DIAGNOSIS — E782 Mixed hyperlipidemia: Secondary | ICD-10-CM | POA: Diagnosis not present

## 2017-09-11 DIAGNOSIS — K7581 Nonalcoholic steatohepatitis (NASH): Secondary | ICD-10-CM | POA: Diagnosis not present

## 2017-09-11 DIAGNOSIS — E119 Type 2 diabetes mellitus without complications: Secondary | ICD-10-CM | POA: Diagnosis not present

## 2017-09-11 DIAGNOSIS — I10 Essential (primary) hypertension: Secondary | ICD-10-CM | POA: Diagnosis not present

## 2017-10-21 DIAGNOSIS — N3000 Acute cystitis without hematuria: Secondary | ICD-10-CM | POA: Diagnosis not present

## 2017-10-21 DIAGNOSIS — Z6828 Body mass index (BMI) 28.0-28.9, adult: Secondary | ICD-10-CM | POA: Diagnosis not present

## 2017-11-23 DIAGNOSIS — Z6828 Body mass index (BMI) 28.0-28.9, adult: Secondary | ICD-10-CM | POA: Diagnosis not present

## 2017-11-23 DIAGNOSIS — J069 Acute upper respiratory infection, unspecified: Secondary | ICD-10-CM | POA: Diagnosis not present

## 2017-12-07 DIAGNOSIS — R0602 Shortness of breath: Secondary | ICD-10-CM | POA: Diagnosis not present

## 2017-12-07 DIAGNOSIS — R05 Cough: Secondary | ICD-10-CM | POA: Diagnosis not present

## 2017-12-07 DIAGNOSIS — Z6829 Body mass index (BMI) 29.0-29.9, adult: Secondary | ICD-10-CM | POA: Diagnosis not present

## 2018-01-09 DIAGNOSIS — I10 Essential (primary) hypertension: Secondary | ICD-10-CM | POA: Diagnosis not present

## 2018-01-09 DIAGNOSIS — E119 Type 2 diabetes mellitus without complications: Secondary | ICD-10-CM | POA: Diagnosis not present

## 2018-01-09 DIAGNOSIS — E782 Mixed hyperlipidemia: Secondary | ICD-10-CM | POA: Diagnosis not present

## 2018-01-09 DIAGNOSIS — Z9189 Other specified personal risk factors, not elsewhere classified: Secondary | ICD-10-CM | POA: Diagnosis not present

## 2018-01-09 DIAGNOSIS — F331 Major depressive disorder, recurrent, moderate: Secondary | ICD-10-CM | POA: Diagnosis not present

## 2018-01-10 DIAGNOSIS — Z6829 Body mass index (BMI) 29.0-29.9, adult: Secondary | ICD-10-CM | POA: Diagnosis not present

## 2018-01-10 DIAGNOSIS — I10 Essential (primary) hypertension: Secondary | ICD-10-CM | POA: Diagnosis not present

## 2018-01-10 DIAGNOSIS — E119 Type 2 diabetes mellitus without complications: Secondary | ICD-10-CM | POA: Diagnosis not present

## 2018-01-10 DIAGNOSIS — F331 Major depressive disorder, recurrent, moderate: Secondary | ICD-10-CM | POA: Diagnosis not present

## 2018-01-10 DIAGNOSIS — M1000 Idiopathic gout, unspecified site: Secondary | ICD-10-CM | POA: Diagnosis not present

## 2018-01-10 DIAGNOSIS — K7581 Nonalcoholic steatohepatitis (NASH): Secondary | ICD-10-CM | POA: Diagnosis not present

## 2018-01-10 DIAGNOSIS — E782 Mixed hyperlipidemia: Secondary | ICD-10-CM | POA: Diagnosis not present

## 2018-01-10 DIAGNOSIS — G43909 Migraine, unspecified, not intractable, without status migrainosus: Secondary | ICD-10-CM | POA: Diagnosis not present

## 2018-02-14 DIAGNOSIS — Z6829 Body mass index (BMI) 29.0-29.9, adult: Secondary | ICD-10-CM | POA: Diagnosis not present

## 2018-02-14 DIAGNOSIS — M1711 Unilateral primary osteoarthritis, right knee: Secondary | ICD-10-CM | POA: Diagnosis not present

## 2018-02-19 DIAGNOSIS — M1711 Unilateral primary osteoarthritis, right knee: Secondary | ICD-10-CM | POA: Diagnosis not present

## 2018-02-19 DIAGNOSIS — Z6829 Body mass index (BMI) 29.0-29.9, adult: Secondary | ICD-10-CM | POA: Diagnosis not present

## 2018-03-18 DIAGNOSIS — K760 Fatty (change of) liver, not elsewhere classified: Secondary | ICD-10-CM | POA: Diagnosis not present

## 2018-03-18 DIAGNOSIS — E785 Hyperlipidemia, unspecified: Secondary | ICD-10-CM | POA: Diagnosis not present

## 2018-03-18 DIAGNOSIS — R51 Headache: Secondary | ICD-10-CM | POA: Diagnosis not present

## 2018-03-18 DIAGNOSIS — I160 Hypertensive urgency: Secondary | ICD-10-CM | POA: Diagnosis not present

## 2018-03-18 DIAGNOSIS — Z87891 Personal history of nicotine dependence: Secondary | ICD-10-CM | POA: Diagnosis not present

## 2018-03-18 DIAGNOSIS — R202 Paresthesia of skin: Secondary | ICD-10-CM | POA: Diagnosis not present

## 2018-03-18 DIAGNOSIS — I169 Hypertensive crisis, unspecified: Secondary | ICD-10-CM | POA: Diagnosis not present

## 2018-03-18 DIAGNOSIS — I10 Essential (primary) hypertension: Secondary | ICD-10-CM | POA: Diagnosis not present

## 2018-03-18 DIAGNOSIS — E119 Type 2 diabetes mellitus without complications: Secondary | ICD-10-CM | POA: Diagnosis not present

## 2018-03-19 DIAGNOSIS — Z885 Allergy status to narcotic agent status: Secondary | ICD-10-CM | POA: Diagnosis not present

## 2018-03-19 DIAGNOSIS — E119 Type 2 diabetes mellitus without complications: Secondary | ICD-10-CM | POA: Diagnosis present

## 2018-03-19 DIAGNOSIS — Z882 Allergy status to sulfonamides status: Secondary | ICD-10-CM | POA: Diagnosis not present

## 2018-03-19 DIAGNOSIS — M1A9XX0 Chronic gout, unspecified, without tophus (tophi): Secondary | ICD-10-CM | POA: Diagnosis present

## 2018-03-19 DIAGNOSIS — K760 Fatty (change of) liver, not elsewhere classified: Secondary | ICD-10-CM | POA: Diagnosis present

## 2018-03-19 DIAGNOSIS — Z7982 Long term (current) use of aspirin: Secondary | ICD-10-CM | POA: Diagnosis not present

## 2018-03-19 DIAGNOSIS — Z7984 Long term (current) use of oral hypoglycemic drugs: Secondary | ICD-10-CM | POA: Diagnosis not present

## 2018-03-19 DIAGNOSIS — Z888 Allergy status to other drugs, medicaments and biological substances status: Secondary | ICD-10-CM | POA: Diagnosis not present

## 2018-03-19 DIAGNOSIS — R51 Headache: Secondary | ICD-10-CM | POA: Diagnosis present

## 2018-03-19 DIAGNOSIS — Z886 Allergy status to analgesic agent status: Secondary | ICD-10-CM | POA: Diagnosis not present

## 2018-03-19 DIAGNOSIS — E785 Hyperlipidemia, unspecified: Secondary | ICD-10-CM | POA: Diagnosis present

## 2018-03-19 DIAGNOSIS — Z79899 Other long term (current) drug therapy: Secondary | ICD-10-CM | POA: Diagnosis not present

## 2018-03-19 DIAGNOSIS — I169 Hypertensive crisis, unspecified: Secondary | ICD-10-CM | POA: Diagnosis present

## 2018-03-19 DIAGNOSIS — I10 Essential (primary) hypertension: Secondary | ICD-10-CM | POA: Diagnosis present

## 2018-03-19 DIAGNOSIS — Z87891 Personal history of nicotine dependence: Secondary | ICD-10-CM | POA: Diagnosis not present

## 2018-03-27 DIAGNOSIS — I10 Essential (primary) hypertension: Secondary | ICD-10-CM | POA: Diagnosis not present

## 2018-03-27 DIAGNOSIS — Z6829 Body mass index (BMI) 29.0-29.9, adult: Secondary | ICD-10-CM | POA: Diagnosis not present

## 2018-03-28 DIAGNOSIS — M65331 Trigger finger, right middle finger: Secondary | ICD-10-CM | POA: Diagnosis not present

## 2018-03-28 DIAGNOSIS — Z6829 Body mass index (BMI) 29.0-29.9, adult: Secondary | ICD-10-CM | POA: Diagnosis not present

## 2018-04-19 DIAGNOSIS — I10 Essential (primary) hypertension: Secondary | ICD-10-CM | POA: Diagnosis not present

## 2018-04-29 DIAGNOSIS — Z8249 Family history of ischemic heart disease and other diseases of the circulatory system: Secondary | ICD-10-CM | POA: Diagnosis not present

## 2018-04-29 DIAGNOSIS — Z8639 Personal history of other endocrine, nutritional and metabolic disease: Secondary | ICD-10-CM | POA: Diagnosis not present

## 2018-04-29 DIAGNOSIS — Z833 Family history of diabetes mellitus: Secondary | ICD-10-CM | POA: Diagnosis not present

## 2018-04-29 DIAGNOSIS — F419 Anxiety disorder, unspecified: Secondary | ICD-10-CM | POA: Diagnosis not present

## 2018-04-29 DIAGNOSIS — Z79899 Other long term (current) drug therapy: Secondary | ICD-10-CM | POA: Diagnosis not present

## 2018-04-29 DIAGNOSIS — Z7984 Long term (current) use of oral hypoglycemic drugs: Secondary | ICD-10-CM | POA: Diagnosis not present

## 2018-04-29 DIAGNOSIS — Z87891 Personal history of nicotine dependence: Secondary | ICD-10-CM | POA: Diagnosis not present

## 2018-04-29 DIAGNOSIS — I160 Hypertensive urgency: Secondary | ICD-10-CM | POA: Diagnosis not present

## 2018-04-29 DIAGNOSIS — E119 Type 2 diabetes mellitus without complications: Secondary | ICD-10-CM | POA: Diagnosis not present

## 2018-04-30 DIAGNOSIS — I10 Essential (primary) hypertension: Secondary | ICD-10-CM | POA: Diagnosis not present

## 2018-04-30 DIAGNOSIS — Z6829 Body mass index (BMI) 29.0-29.9, adult: Secondary | ICD-10-CM | POA: Diagnosis not present

## 2018-05-15 DIAGNOSIS — Z1331 Encounter for screening for depression: Secondary | ICD-10-CM | POA: Diagnosis not present

## 2018-05-15 DIAGNOSIS — I10 Essential (primary) hypertension: Secondary | ICD-10-CM | POA: Diagnosis not present

## 2018-05-15 DIAGNOSIS — Z1389 Encounter for screening for other disorder: Secondary | ICD-10-CM | POA: Diagnosis not present

## 2018-05-15 DIAGNOSIS — G43909 Migraine, unspecified, not intractable, without status migrainosus: Secondary | ICD-10-CM | POA: Diagnosis not present

## 2018-05-15 DIAGNOSIS — E119 Type 2 diabetes mellitus without complications: Secondary | ICD-10-CM | POA: Diagnosis not present

## 2018-05-15 DIAGNOSIS — Z6828 Body mass index (BMI) 28.0-28.9, adult: Secondary | ICD-10-CM | POA: Diagnosis not present

## 2018-05-15 DIAGNOSIS — F331 Major depressive disorder, recurrent, moderate: Secondary | ICD-10-CM | POA: Diagnosis not present

## 2018-05-15 DIAGNOSIS — K7581 Nonalcoholic steatohepatitis (NASH): Secondary | ICD-10-CM | POA: Diagnosis not present

## 2018-05-15 DIAGNOSIS — E782 Mixed hyperlipidemia: Secondary | ICD-10-CM | POA: Diagnosis not present

## 2018-05-23 DIAGNOSIS — M6530 Trigger finger, unspecified finger: Secondary | ICD-10-CM | POA: Insufficient documentation

## 2018-07-30 DIAGNOSIS — Z6828 Body mass index (BMI) 28.0-28.9, adult: Secondary | ICD-10-CM | POA: Diagnosis not present

## 2018-07-30 DIAGNOSIS — M79604 Pain in right leg: Secondary | ICD-10-CM | POA: Diagnosis not present

## 2018-08-08 DIAGNOSIS — M79604 Pain in right leg: Secondary | ICD-10-CM | POA: Diagnosis not present

## 2018-08-08 DIAGNOSIS — Z6828 Body mass index (BMI) 28.0-28.9, adult: Secondary | ICD-10-CM | POA: Diagnosis not present

## 2018-08-24 DIAGNOSIS — M5136 Other intervertebral disc degeneration, lumbar region: Secondary | ICD-10-CM | POA: Diagnosis not present

## 2018-08-24 DIAGNOSIS — M79604 Pain in right leg: Secondary | ICD-10-CM | POA: Diagnosis not present

## 2018-08-24 DIAGNOSIS — M48061 Spinal stenosis, lumbar region without neurogenic claudication: Secondary | ICD-10-CM | POA: Diagnosis not present

## 2018-08-24 DIAGNOSIS — M5116 Intervertebral disc disorders with radiculopathy, lumbar region: Secondary | ICD-10-CM | POA: Diagnosis not present

## 2018-08-24 DIAGNOSIS — M47816 Spondylosis without myelopathy or radiculopathy, lumbar region: Secondary | ICD-10-CM | POA: Diagnosis not present

## 2018-09-03 DIAGNOSIS — M79671 Pain in right foot: Secondary | ICD-10-CM | POA: Diagnosis not present

## 2018-09-03 DIAGNOSIS — M79641 Pain in right hand: Secondary | ICD-10-CM | POA: Diagnosis not present

## 2018-09-03 DIAGNOSIS — M79642 Pain in left hand: Secondary | ICD-10-CM | POA: Diagnosis not present

## 2018-09-03 DIAGNOSIS — M5416 Radiculopathy, lumbar region: Secondary | ICD-10-CM | POA: Diagnosis not present

## 2018-09-03 DIAGNOSIS — M5126 Other intervertebral disc displacement, lumbar region: Secondary | ICD-10-CM | POA: Diagnosis not present

## 2018-09-03 DIAGNOSIS — M79672 Pain in left foot: Secondary | ICD-10-CM | POA: Diagnosis not present

## 2018-09-05 DIAGNOSIS — M5416 Radiculopathy, lumbar region: Secondary | ICD-10-CM | POA: Diagnosis not present

## 2018-09-05 DIAGNOSIS — M79672 Pain in left foot: Secondary | ICD-10-CM | POA: Diagnosis not present

## 2018-09-05 DIAGNOSIS — M5126 Other intervertebral disc displacement, lumbar region: Secondary | ICD-10-CM | POA: Diagnosis not present

## 2018-09-05 DIAGNOSIS — M79671 Pain in right foot: Secondary | ICD-10-CM | POA: Diagnosis not present

## 2018-09-05 DIAGNOSIS — M79641 Pain in right hand: Secondary | ICD-10-CM | POA: Diagnosis not present

## 2018-09-05 DIAGNOSIS — M79642 Pain in left hand: Secondary | ICD-10-CM | POA: Diagnosis not present

## 2018-09-11 DIAGNOSIS — M79641 Pain in right hand: Secondary | ICD-10-CM | POA: Diagnosis not present

## 2018-09-11 DIAGNOSIS — M5416 Radiculopathy, lumbar region: Secondary | ICD-10-CM | POA: Diagnosis not present

## 2018-09-11 DIAGNOSIS — M79642 Pain in left hand: Secondary | ICD-10-CM | POA: Diagnosis not present

## 2018-09-11 DIAGNOSIS — M79671 Pain in right foot: Secondary | ICD-10-CM | POA: Diagnosis not present

## 2018-09-11 DIAGNOSIS — M5126 Other intervertebral disc displacement, lumbar region: Secondary | ICD-10-CM | POA: Diagnosis not present

## 2018-09-11 DIAGNOSIS — M79672 Pain in left foot: Secondary | ICD-10-CM | POA: Diagnosis not present

## 2018-09-13 DIAGNOSIS — M79671 Pain in right foot: Secondary | ICD-10-CM | POA: Diagnosis not present

## 2018-09-13 DIAGNOSIS — M5416 Radiculopathy, lumbar region: Secondary | ICD-10-CM | POA: Diagnosis not present

## 2018-09-13 DIAGNOSIS — M79672 Pain in left foot: Secondary | ICD-10-CM | POA: Diagnosis not present

## 2018-09-13 DIAGNOSIS — M5126 Other intervertebral disc displacement, lumbar region: Secondary | ICD-10-CM | POA: Diagnosis not present

## 2018-09-13 DIAGNOSIS — M79641 Pain in right hand: Secondary | ICD-10-CM | POA: Diagnosis not present

## 2018-09-13 DIAGNOSIS — M79642 Pain in left hand: Secondary | ICD-10-CM | POA: Diagnosis not present

## 2018-09-14 DIAGNOSIS — E782 Mixed hyperlipidemia: Secondary | ICD-10-CM | POA: Diagnosis not present

## 2018-09-14 DIAGNOSIS — E119 Type 2 diabetes mellitus without complications: Secondary | ICD-10-CM | POA: Diagnosis not present

## 2018-09-14 DIAGNOSIS — I10 Essential (primary) hypertension: Secondary | ICD-10-CM | POA: Diagnosis not present

## 2018-09-14 DIAGNOSIS — Z9189 Other specified personal risk factors, not elsewhere classified: Secondary | ICD-10-CM | POA: Diagnosis not present

## 2018-09-14 DIAGNOSIS — R5383 Other fatigue: Secondary | ICD-10-CM | POA: Diagnosis not present

## 2018-09-17 DIAGNOSIS — M79642 Pain in left hand: Secondary | ICD-10-CM | POA: Diagnosis not present

## 2018-09-17 DIAGNOSIS — M5416 Radiculopathy, lumbar region: Secondary | ICD-10-CM | POA: Diagnosis not present

## 2018-09-17 DIAGNOSIS — M5126 Other intervertebral disc displacement, lumbar region: Secondary | ICD-10-CM | POA: Diagnosis not present

## 2018-09-17 DIAGNOSIS — M79641 Pain in right hand: Secondary | ICD-10-CM | POA: Diagnosis not present

## 2018-09-17 DIAGNOSIS — M79672 Pain in left foot: Secondary | ICD-10-CM | POA: Diagnosis not present

## 2018-09-17 DIAGNOSIS — M79671 Pain in right foot: Secondary | ICD-10-CM | POA: Diagnosis not present

## 2018-09-18 DIAGNOSIS — Z6828 Body mass index (BMI) 28.0-28.9, adult: Secondary | ICD-10-CM | POA: Diagnosis not present

## 2018-09-18 DIAGNOSIS — Z0001 Encounter for general adult medical examination with abnormal findings: Secondary | ICD-10-CM | POA: Diagnosis not present

## 2018-09-24 DIAGNOSIS — M79642 Pain in left hand: Secondary | ICD-10-CM | POA: Diagnosis not present

## 2018-09-24 DIAGNOSIS — M5126 Other intervertebral disc displacement, lumbar region: Secondary | ICD-10-CM | POA: Diagnosis not present

## 2018-09-24 DIAGNOSIS — M79672 Pain in left foot: Secondary | ICD-10-CM | POA: Diagnosis not present

## 2018-09-24 DIAGNOSIS — M79671 Pain in right foot: Secondary | ICD-10-CM | POA: Diagnosis not present

## 2018-09-24 DIAGNOSIS — M79641 Pain in right hand: Secondary | ICD-10-CM | POA: Diagnosis not present

## 2018-09-24 DIAGNOSIS — M5416 Radiculopathy, lumbar region: Secondary | ICD-10-CM | POA: Diagnosis not present

## 2018-09-27 DIAGNOSIS — M79641 Pain in right hand: Secondary | ICD-10-CM | POA: Diagnosis not present

## 2018-09-27 DIAGNOSIS — M5416 Radiculopathy, lumbar region: Secondary | ICD-10-CM | POA: Diagnosis not present

## 2018-09-27 DIAGNOSIS — M79671 Pain in right foot: Secondary | ICD-10-CM | POA: Diagnosis not present

## 2018-09-27 DIAGNOSIS — M545 Low back pain: Secondary | ICD-10-CM | POA: Diagnosis not present

## 2018-09-27 DIAGNOSIS — M79642 Pain in left hand: Secondary | ICD-10-CM | POA: Diagnosis not present

## 2018-09-27 DIAGNOSIS — M79672 Pain in left foot: Secondary | ICD-10-CM | POA: Diagnosis not present

## 2018-10-02 DIAGNOSIS — M545 Low back pain: Secondary | ICD-10-CM | POA: Diagnosis not present

## 2018-10-02 DIAGNOSIS — M79671 Pain in right foot: Secondary | ICD-10-CM | POA: Diagnosis not present

## 2018-10-02 DIAGNOSIS — M79642 Pain in left hand: Secondary | ICD-10-CM | POA: Diagnosis not present

## 2018-10-02 DIAGNOSIS — M79641 Pain in right hand: Secondary | ICD-10-CM | POA: Diagnosis not present

## 2018-10-02 DIAGNOSIS — M79672 Pain in left foot: Secondary | ICD-10-CM | POA: Diagnosis not present

## 2018-10-02 DIAGNOSIS — M5416 Radiculopathy, lumbar region: Secondary | ICD-10-CM | POA: Diagnosis not present

## 2018-10-04 DIAGNOSIS — M79642 Pain in left hand: Secondary | ICD-10-CM | POA: Diagnosis not present

## 2018-10-04 DIAGNOSIS — M79641 Pain in right hand: Secondary | ICD-10-CM | POA: Diagnosis not present

## 2018-10-04 DIAGNOSIS — M5416 Radiculopathy, lumbar region: Secondary | ICD-10-CM | POA: Diagnosis not present

## 2018-10-04 DIAGNOSIS — M545 Low back pain: Secondary | ICD-10-CM | POA: Diagnosis not present

## 2018-10-04 DIAGNOSIS — M79671 Pain in right foot: Secondary | ICD-10-CM | POA: Diagnosis not present

## 2018-10-04 DIAGNOSIS — M79672 Pain in left foot: Secondary | ICD-10-CM | POA: Diagnosis not present

## 2018-10-08 DIAGNOSIS — M5416 Radiculopathy, lumbar region: Secondary | ICD-10-CM | POA: Diagnosis not present

## 2018-10-08 DIAGNOSIS — Z23 Encounter for immunization: Secondary | ICD-10-CM | POA: Diagnosis not present

## 2018-10-08 DIAGNOSIS — M79672 Pain in left foot: Secondary | ICD-10-CM | POA: Diagnosis not present

## 2018-10-08 DIAGNOSIS — M79641 Pain in right hand: Secondary | ICD-10-CM | POA: Diagnosis not present

## 2018-10-08 DIAGNOSIS — M79671 Pain in right foot: Secondary | ICD-10-CM | POA: Diagnosis not present

## 2018-10-08 DIAGNOSIS — M545 Low back pain: Secondary | ICD-10-CM | POA: Diagnosis not present

## 2018-10-08 DIAGNOSIS — M79642 Pain in left hand: Secondary | ICD-10-CM | POA: Diagnosis not present

## 2018-10-10 DIAGNOSIS — M5416 Radiculopathy, lumbar region: Secondary | ICD-10-CM | POA: Diagnosis not present

## 2018-10-10 DIAGNOSIS — M79672 Pain in left foot: Secondary | ICD-10-CM | POA: Diagnosis not present

## 2018-10-10 DIAGNOSIS — M79642 Pain in left hand: Secondary | ICD-10-CM | POA: Diagnosis not present

## 2018-10-10 DIAGNOSIS — M545 Low back pain: Secondary | ICD-10-CM | POA: Diagnosis not present

## 2018-10-10 DIAGNOSIS — M79641 Pain in right hand: Secondary | ICD-10-CM | POA: Diagnosis not present

## 2018-10-10 DIAGNOSIS — M79671 Pain in right foot: Secondary | ICD-10-CM | POA: Diagnosis not present

## 2018-10-15 DIAGNOSIS — M545 Low back pain: Secondary | ICD-10-CM | POA: Diagnosis not present

## 2018-10-15 DIAGNOSIS — M5416 Radiculopathy, lumbar region: Secondary | ICD-10-CM | POA: Diagnosis not present

## 2018-10-15 DIAGNOSIS — M79641 Pain in right hand: Secondary | ICD-10-CM | POA: Diagnosis not present

## 2018-10-15 DIAGNOSIS — M79672 Pain in left foot: Secondary | ICD-10-CM | POA: Diagnosis not present

## 2018-10-15 DIAGNOSIS — M79671 Pain in right foot: Secondary | ICD-10-CM | POA: Diagnosis not present

## 2018-10-15 DIAGNOSIS — M79642 Pain in left hand: Secondary | ICD-10-CM | POA: Diagnosis not present

## 2018-10-17 DIAGNOSIS — M79671 Pain in right foot: Secondary | ICD-10-CM | POA: Diagnosis not present

## 2018-10-17 DIAGNOSIS — M79642 Pain in left hand: Secondary | ICD-10-CM | POA: Diagnosis not present

## 2018-10-17 DIAGNOSIS — M545 Low back pain: Secondary | ICD-10-CM | POA: Diagnosis not present

## 2018-10-17 DIAGNOSIS — M79672 Pain in left foot: Secondary | ICD-10-CM | POA: Diagnosis not present

## 2018-10-17 DIAGNOSIS — M5416 Radiculopathy, lumbar region: Secondary | ICD-10-CM | POA: Diagnosis not present

## 2018-10-17 DIAGNOSIS — M79641 Pain in right hand: Secondary | ICD-10-CM | POA: Diagnosis not present

## 2018-10-22 DIAGNOSIS — M545 Low back pain: Secondary | ICD-10-CM | POA: Diagnosis not present

## 2018-10-22 DIAGNOSIS — M79672 Pain in left foot: Secondary | ICD-10-CM | POA: Diagnosis not present

## 2018-10-22 DIAGNOSIS — M79642 Pain in left hand: Secondary | ICD-10-CM | POA: Diagnosis not present

## 2018-10-22 DIAGNOSIS — M79641 Pain in right hand: Secondary | ICD-10-CM | POA: Diagnosis not present

## 2018-10-22 DIAGNOSIS — M79671 Pain in right foot: Secondary | ICD-10-CM | POA: Diagnosis not present

## 2018-10-22 DIAGNOSIS — M5416 Radiculopathy, lumbar region: Secondary | ICD-10-CM | POA: Diagnosis not present

## 2018-10-24 DIAGNOSIS — M79641 Pain in right hand: Secondary | ICD-10-CM | POA: Diagnosis not present

## 2018-10-24 DIAGNOSIS — M79672 Pain in left foot: Secondary | ICD-10-CM | POA: Diagnosis not present

## 2018-10-24 DIAGNOSIS — M79642 Pain in left hand: Secondary | ICD-10-CM | POA: Diagnosis not present

## 2018-10-24 DIAGNOSIS — M545 Low back pain: Secondary | ICD-10-CM | POA: Diagnosis not present

## 2018-10-24 DIAGNOSIS — M79671 Pain in right foot: Secondary | ICD-10-CM | POA: Diagnosis not present

## 2018-10-24 DIAGNOSIS — M5416 Radiculopathy, lumbar region: Secondary | ICD-10-CM | POA: Diagnosis not present

## 2018-10-29 DIAGNOSIS — M79641 Pain in right hand: Secondary | ICD-10-CM | POA: Diagnosis not present

## 2018-10-29 DIAGNOSIS — M79672 Pain in left foot: Secondary | ICD-10-CM | POA: Diagnosis not present

## 2018-10-29 DIAGNOSIS — M79642 Pain in left hand: Secondary | ICD-10-CM | POA: Diagnosis not present

## 2018-10-29 DIAGNOSIS — M5416 Radiculopathy, lumbar region: Secondary | ICD-10-CM | POA: Diagnosis not present

## 2018-10-29 DIAGNOSIS — M5126 Other intervertebral disc displacement, lumbar region: Secondary | ICD-10-CM | POA: Diagnosis not present

## 2018-10-29 DIAGNOSIS — M79671 Pain in right foot: Secondary | ICD-10-CM | POA: Diagnosis not present

## 2018-10-31 DIAGNOSIS — M79671 Pain in right foot: Secondary | ICD-10-CM | POA: Diagnosis not present

## 2018-10-31 DIAGNOSIS — M79672 Pain in left foot: Secondary | ICD-10-CM | POA: Diagnosis not present

## 2018-10-31 DIAGNOSIS — M5416 Radiculopathy, lumbar region: Secondary | ICD-10-CM | POA: Diagnosis not present

## 2018-10-31 DIAGNOSIS — M47816 Spondylosis without myelopathy or radiculopathy, lumbar region: Secondary | ICD-10-CM | POA: Diagnosis not present

## 2018-10-31 DIAGNOSIS — M5126 Other intervertebral disc displacement, lumbar region: Secondary | ICD-10-CM | POA: Diagnosis not present

## 2018-10-31 DIAGNOSIS — M79642 Pain in left hand: Secondary | ICD-10-CM | POA: Diagnosis not present

## 2018-10-31 DIAGNOSIS — M79641 Pain in right hand: Secondary | ICD-10-CM | POA: Diagnosis not present

## 2018-11-05 DIAGNOSIS — M5126 Other intervertebral disc displacement, lumbar region: Secondary | ICD-10-CM | POA: Diagnosis not present

## 2018-11-05 DIAGNOSIS — M5416 Radiculopathy, lumbar region: Secondary | ICD-10-CM | POA: Diagnosis not present

## 2018-11-05 DIAGNOSIS — M79642 Pain in left hand: Secondary | ICD-10-CM | POA: Diagnosis not present

## 2018-11-05 DIAGNOSIS — M79671 Pain in right foot: Secondary | ICD-10-CM | POA: Diagnosis not present

## 2018-11-05 DIAGNOSIS — M79672 Pain in left foot: Secondary | ICD-10-CM | POA: Diagnosis not present

## 2018-11-05 DIAGNOSIS — M79641 Pain in right hand: Secondary | ICD-10-CM | POA: Diagnosis not present

## 2018-11-07 DIAGNOSIS — M5416 Radiculopathy, lumbar region: Secondary | ICD-10-CM | POA: Diagnosis not present

## 2018-11-07 DIAGNOSIS — M5126 Other intervertebral disc displacement, lumbar region: Secondary | ICD-10-CM | POA: Diagnosis not present

## 2018-11-07 DIAGNOSIS — M79672 Pain in left foot: Secondary | ICD-10-CM | POA: Diagnosis not present

## 2018-11-07 DIAGNOSIS — M79642 Pain in left hand: Secondary | ICD-10-CM | POA: Diagnosis not present

## 2018-11-07 DIAGNOSIS — M79671 Pain in right foot: Secondary | ICD-10-CM | POA: Diagnosis not present

## 2018-11-07 DIAGNOSIS — M79641 Pain in right hand: Secondary | ICD-10-CM | POA: Diagnosis not present

## 2018-11-13 DIAGNOSIS — M5416 Radiculopathy, lumbar region: Secondary | ICD-10-CM | POA: Diagnosis not present

## 2018-11-13 DIAGNOSIS — M4727 Other spondylosis with radiculopathy, lumbosacral region: Secondary | ICD-10-CM | POA: Diagnosis not present

## 2018-11-27 DIAGNOSIS — M5126 Other intervertebral disc displacement, lumbar region: Secondary | ICD-10-CM | POA: Insufficient documentation

## 2018-11-27 DIAGNOSIS — G5711 Meralgia paresthetica, right lower limb: Secondary | ICD-10-CM | POA: Diagnosis not present

## 2018-11-27 DIAGNOSIS — M47816 Spondylosis without myelopathy or radiculopathy, lumbar region: Secondary | ICD-10-CM | POA: Diagnosis not present

## 2018-12-24 DIAGNOSIS — E1169 Type 2 diabetes mellitus with other specified complication: Secondary | ICD-10-CM | POA: Diagnosis not present

## 2018-12-24 DIAGNOSIS — I10 Essential (primary) hypertension: Secondary | ICD-10-CM | POA: Diagnosis not present

## 2018-12-24 DIAGNOSIS — E782 Mixed hyperlipidemia: Secondary | ICD-10-CM | POA: Diagnosis not present

## 2018-12-25 DIAGNOSIS — M47816 Spondylosis without myelopathy or radiculopathy, lumbar region: Secondary | ICD-10-CM | POA: Diagnosis not present

## 2018-12-25 DIAGNOSIS — M4727 Other spondylosis with radiculopathy, lumbosacral region: Secondary | ICD-10-CM | POA: Diagnosis not present

## 2019-01-07 DIAGNOSIS — E1169 Type 2 diabetes mellitus with other specified complication: Secondary | ICD-10-CM | POA: Diagnosis not present

## 2019-01-07 DIAGNOSIS — I10 Essential (primary) hypertension: Secondary | ICD-10-CM | POA: Diagnosis not present

## 2019-01-07 DIAGNOSIS — E782 Mixed hyperlipidemia: Secondary | ICD-10-CM | POA: Diagnosis not present

## 2019-01-08 DIAGNOSIS — M5126 Other intervertebral disc displacement, lumbar region: Secondary | ICD-10-CM | POA: Diagnosis not present

## 2019-01-08 DIAGNOSIS — M47816 Spondylosis without myelopathy or radiculopathy, lumbar region: Secondary | ICD-10-CM | POA: Diagnosis not present

## 2019-01-10 DIAGNOSIS — E1169 Type 2 diabetes mellitus with other specified complication: Secondary | ICD-10-CM | POA: Diagnosis not present

## 2019-01-10 DIAGNOSIS — Z1331 Encounter for screening for depression: Secondary | ICD-10-CM | POA: Diagnosis not present

## 2019-01-10 DIAGNOSIS — F331 Major depressive disorder, recurrent, moderate: Secondary | ICD-10-CM | POA: Diagnosis not present

## 2019-01-10 DIAGNOSIS — E739 Lactose intolerance, unspecified: Secondary | ICD-10-CM | POA: Diagnosis not present

## 2019-01-10 DIAGNOSIS — K7581 Nonalcoholic steatohepatitis (NASH): Secondary | ICD-10-CM | POA: Diagnosis not present

## 2019-01-10 DIAGNOSIS — Z1389 Encounter for screening for other disorder: Secondary | ICD-10-CM | POA: Diagnosis not present

## 2019-01-10 DIAGNOSIS — E782 Mixed hyperlipidemia: Secondary | ICD-10-CM | POA: Diagnosis not present

## 2019-01-10 DIAGNOSIS — I10 Essential (primary) hypertension: Secondary | ICD-10-CM | POA: Diagnosis not present

## 2019-01-24 DIAGNOSIS — E782 Mixed hyperlipidemia: Secondary | ICD-10-CM | POA: Diagnosis not present

## 2019-01-24 DIAGNOSIS — E1169 Type 2 diabetes mellitus with other specified complication: Secondary | ICD-10-CM | POA: Diagnosis not present

## 2019-01-24 DIAGNOSIS — I10 Essential (primary) hypertension: Secondary | ICD-10-CM | POA: Diagnosis not present

## 2019-02-27 DIAGNOSIS — I10 Essential (primary) hypertension: Secondary | ICD-10-CM | POA: Diagnosis not present

## 2019-02-27 DIAGNOSIS — Z6827 Body mass index (BMI) 27.0-27.9, adult: Secondary | ICD-10-CM | POA: Diagnosis not present

## 2019-03-07 DIAGNOSIS — E119 Type 2 diabetes mellitus without complications: Secondary | ICD-10-CM | POA: Diagnosis not present

## 2019-03-07 DIAGNOSIS — E1165 Type 2 diabetes mellitus with hyperglycemia: Secondary | ICD-10-CM | POA: Diagnosis not present

## 2019-03-07 DIAGNOSIS — Z7984 Long term (current) use of oral hypoglycemic drugs: Secondary | ICD-10-CM | POA: Diagnosis not present

## 2019-03-07 DIAGNOSIS — H25813 Combined forms of age-related cataract, bilateral: Secondary | ICD-10-CM | POA: Diagnosis not present

## 2019-03-19 DIAGNOSIS — Z833 Family history of diabetes mellitus: Secondary | ICD-10-CM | POA: Diagnosis not present

## 2019-03-19 DIAGNOSIS — Z87891 Personal history of nicotine dependence: Secondary | ICD-10-CM | POA: Diagnosis not present

## 2019-03-19 DIAGNOSIS — I10 Essential (primary) hypertension: Secondary | ICD-10-CM | POA: Diagnosis not present

## 2019-03-19 DIAGNOSIS — R202 Paresthesia of skin: Secondary | ICD-10-CM | POA: Diagnosis not present

## 2019-03-19 DIAGNOSIS — Z8639 Personal history of other endocrine, nutritional and metabolic disease: Secondary | ICD-10-CM | POA: Diagnosis not present

## 2019-03-19 DIAGNOSIS — R200 Anesthesia of skin: Secondary | ICD-10-CM | POA: Diagnosis not present

## 2019-03-19 DIAGNOSIS — R531 Weakness: Secondary | ICD-10-CM | POA: Diagnosis not present

## 2019-03-19 DIAGNOSIS — E119 Type 2 diabetes mellitus without complications: Secondary | ICD-10-CM | POA: Diagnosis not present

## 2019-03-19 DIAGNOSIS — F419 Anxiety disorder, unspecified: Secondary | ICD-10-CM | POA: Diagnosis not present

## 2019-03-19 DIAGNOSIS — Z7984 Long term (current) use of oral hypoglycemic drugs: Secondary | ICD-10-CM | POA: Diagnosis not present

## 2019-03-19 DIAGNOSIS — Z8249 Family history of ischemic heart disease and other diseases of the circulatory system: Secondary | ICD-10-CM | POA: Diagnosis not present

## 2019-03-19 DIAGNOSIS — Z888 Allergy status to other drugs, medicaments and biological substances status: Secondary | ICD-10-CM | POA: Diagnosis not present

## 2019-03-19 DIAGNOSIS — Z882 Allergy status to sulfonamides status: Secondary | ICD-10-CM | POA: Diagnosis not present

## 2019-03-25 DIAGNOSIS — E782 Mixed hyperlipidemia: Secondary | ICD-10-CM | POA: Diagnosis not present

## 2019-03-25 DIAGNOSIS — F331 Major depressive disorder, recurrent, moderate: Secondary | ICD-10-CM | POA: Diagnosis not present

## 2019-03-25 DIAGNOSIS — E1169 Type 2 diabetes mellitus with other specified complication: Secondary | ICD-10-CM | POA: Diagnosis not present

## 2019-03-25 DIAGNOSIS — K7581 Nonalcoholic steatohepatitis (NASH): Secondary | ICD-10-CM | POA: Diagnosis not present

## 2019-03-25 DIAGNOSIS — E739 Lactose intolerance, unspecified: Secondary | ICD-10-CM | POA: Diagnosis not present

## 2019-03-25 DIAGNOSIS — G43909 Migraine, unspecified, not intractable, without status migrainosus: Secondary | ICD-10-CM | POA: Diagnosis not present

## 2019-03-25 DIAGNOSIS — I10 Essential (primary) hypertension: Secondary | ICD-10-CM | POA: Diagnosis not present

## 2019-03-25 DIAGNOSIS — M1000 Idiopathic gout, unspecified site: Secondary | ICD-10-CM | POA: Diagnosis not present

## 2019-03-28 DIAGNOSIS — Z6827 Body mass index (BMI) 27.0-27.9, adult: Secondary | ICD-10-CM | POA: Diagnosis not present

## 2019-03-28 DIAGNOSIS — I10 Essential (primary) hypertension: Secondary | ICD-10-CM | POA: Diagnosis not present

## 2019-04-04 DIAGNOSIS — I10 Essential (primary) hypertension: Secondary | ICD-10-CM | POA: Diagnosis not present

## 2019-04-04 DIAGNOSIS — K7581 Nonalcoholic steatohepatitis (NASH): Secondary | ICD-10-CM | POA: Diagnosis not present

## 2019-05-02 DIAGNOSIS — E782 Mixed hyperlipidemia: Secondary | ICD-10-CM | POA: Diagnosis not present

## 2019-05-02 DIAGNOSIS — I10 Essential (primary) hypertension: Secondary | ICD-10-CM | POA: Diagnosis not present

## 2019-05-02 DIAGNOSIS — R5383 Other fatigue: Secondary | ICD-10-CM | POA: Diagnosis not present

## 2019-05-02 DIAGNOSIS — E1169 Type 2 diabetes mellitus with other specified complication: Secondary | ICD-10-CM | POA: Diagnosis not present

## 2019-05-06 DIAGNOSIS — G43909 Migraine, unspecified, not intractable, without status migrainosus: Secondary | ICD-10-CM | POA: Diagnosis not present

## 2019-05-06 DIAGNOSIS — K7581 Nonalcoholic steatohepatitis (NASH): Secondary | ICD-10-CM | POA: Diagnosis not present

## 2019-05-06 DIAGNOSIS — I10 Essential (primary) hypertension: Secondary | ICD-10-CM | POA: Diagnosis not present

## 2019-05-06 DIAGNOSIS — M1000 Idiopathic gout, unspecified site: Secondary | ICD-10-CM | POA: Diagnosis not present

## 2019-05-06 DIAGNOSIS — E739 Lactose intolerance, unspecified: Secondary | ICD-10-CM | POA: Diagnosis not present

## 2019-05-06 DIAGNOSIS — E782 Mixed hyperlipidemia: Secondary | ICD-10-CM | POA: Diagnosis not present

## 2019-05-06 DIAGNOSIS — E1122 Type 2 diabetes mellitus with diabetic chronic kidney disease: Secondary | ICD-10-CM | POA: Diagnosis not present

## 2019-05-06 DIAGNOSIS — E1169 Type 2 diabetes mellitus with other specified complication: Secondary | ICD-10-CM | POA: Diagnosis not present

## 2019-06-11 DIAGNOSIS — R202 Paresthesia of skin: Secondary | ICD-10-CM | POA: Diagnosis not present

## 2019-06-11 DIAGNOSIS — E782 Mixed hyperlipidemia: Secondary | ICD-10-CM | POA: Diagnosis not present

## 2019-06-11 DIAGNOSIS — Z6828 Body mass index (BMI) 28.0-28.9, adult: Secondary | ICD-10-CM | POA: Diagnosis not present

## 2019-06-11 DIAGNOSIS — I10 Essential (primary) hypertension: Secondary | ICD-10-CM | POA: Diagnosis not present

## 2019-06-18 ENCOUNTER — Encounter: Payer: Self-pay | Admitting: Neurology

## 2019-07-04 DIAGNOSIS — E119 Type 2 diabetes mellitus without complications: Secondary | ICD-10-CM | POA: Diagnosis not present

## 2019-07-04 DIAGNOSIS — Z888 Allergy status to other drugs, medicaments and biological substances status: Secondary | ICD-10-CM | POA: Diagnosis not present

## 2019-07-04 DIAGNOSIS — R6884 Jaw pain: Secondary | ICD-10-CM | POA: Diagnosis not present

## 2019-07-04 DIAGNOSIS — Z87891 Personal history of nicotine dependence: Secondary | ICD-10-CM | POA: Diagnosis not present

## 2019-07-04 DIAGNOSIS — Z7982 Long term (current) use of aspirin: Secondary | ICD-10-CM | POA: Diagnosis not present

## 2019-07-04 DIAGNOSIS — R202 Paresthesia of skin: Secondary | ICD-10-CM | POA: Diagnosis not present

## 2019-07-04 DIAGNOSIS — Z9851 Tubal ligation status: Secondary | ICD-10-CM | POA: Diagnosis not present

## 2019-07-04 DIAGNOSIS — E785 Hyperlipidemia, unspecified: Secondary | ICD-10-CM | POA: Diagnosis not present

## 2019-07-04 DIAGNOSIS — I700 Atherosclerosis of aorta: Secondary | ICD-10-CM | POA: Diagnosis not present

## 2019-07-04 DIAGNOSIS — I10 Essential (primary) hypertension: Secondary | ICD-10-CM | POA: Diagnosis not present

## 2019-07-04 DIAGNOSIS — Z79899 Other long term (current) drug therapy: Secondary | ICD-10-CM | POA: Diagnosis not present

## 2019-07-04 DIAGNOSIS — R0789 Other chest pain: Secondary | ICD-10-CM | POA: Diagnosis not present

## 2019-07-04 DIAGNOSIS — Z882 Allergy status to sulfonamides status: Secondary | ICD-10-CM | POA: Diagnosis not present

## 2019-07-04 DIAGNOSIS — Z9049 Acquired absence of other specified parts of digestive tract: Secondary | ICD-10-CM | POA: Diagnosis not present

## 2019-07-04 DIAGNOSIS — R079 Chest pain, unspecified: Secondary | ICD-10-CM | POA: Diagnosis not present

## 2019-07-04 DIAGNOSIS — Z7984 Long term (current) use of oral hypoglycemic drugs: Secondary | ICD-10-CM | POA: Diagnosis not present

## 2019-07-04 DIAGNOSIS — M1A9XX0 Chronic gout, unspecified, without tophus (tophi): Secondary | ICD-10-CM | POA: Diagnosis not present

## 2019-07-04 DIAGNOSIS — Z886 Allergy status to analgesic agent status: Secondary | ICD-10-CM | POA: Diagnosis not present

## 2019-07-04 DIAGNOSIS — R200 Anesthesia of skin: Secondary | ICD-10-CM | POA: Diagnosis not present

## 2019-07-04 DIAGNOSIS — M542 Cervicalgia: Secondary | ICD-10-CM | POA: Diagnosis not present

## 2019-07-04 DIAGNOSIS — I25119 Atherosclerotic heart disease of native coronary artery with unspecified angina pectoris: Secondary | ICD-10-CM | POA: Diagnosis not present

## 2019-07-04 DIAGNOSIS — Z90710 Acquired absence of both cervix and uterus: Secondary | ICD-10-CM | POA: Diagnosis not present

## 2019-07-05 DIAGNOSIS — I10 Essential (primary) hypertension: Secondary | ICD-10-CM | POA: Diagnosis not present

## 2019-07-05 DIAGNOSIS — I348 Other nonrheumatic mitral valve disorders: Secondary | ICD-10-CM | POA: Diagnosis not present

## 2019-07-05 DIAGNOSIS — R079 Chest pain, unspecified: Secondary | ICD-10-CM | POA: Diagnosis not present

## 2019-07-05 DIAGNOSIS — I358 Other nonrheumatic aortic valve disorders: Secondary | ICD-10-CM | POA: Diagnosis not present

## 2019-07-05 DIAGNOSIS — Z87891 Personal history of nicotine dependence: Secondary | ICD-10-CM | POA: Diagnosis not present

## 2019-07-05 DIAGNOSIS — E785 Hyperlipidemia, unspecified: Secondary | ICD-10-CM | POA: Diagnosis not present

## 2019-07-05 DIAGNOSIS — E119 Type 2 diabetes mellitus without complications: Secondary | ICD-10-CM | POA: Diagnosis not present

## 2019-07-15 DIAGNOSIS — E119 Type 2 diabetes mellitus without complications: Secondary | ICD-10-CM | POA: Diagnosis not present

## 2019-07-15 DIAGNOSIS — H25813 Combined forms of age-related cataract, bilateral: Secondary | ICD-10-CM | POA: Diagnosis not present

## 2019-07-25 DIAGNOSIS — E782 Mixed hyperlipidemia: Secondary | ICD-10-CM | POA: Diagnosis not present

## 2019-07-25 DIAGNOSIS — R001 Bradycardia, unspecified: Secondary | ICD-10-CM | POA: Diagnosis not present

## 2019-07-25 DIAGNOSIS — E119 Type 2 diabetes mellitus without complications: Secondary | ICD-10-CM | POA: Diagnosis not present

## 2019-07-25 DIAGNOSIS — R1310 Dysphagia, unspecified: Secondary | ICD-10-CM | POA: Diagnosis not present

## 2019-07-25 DIAGNOSIS — R079 Chest pain, unspecified: Secondary | ICD-10-CM | POA: Diagnosis not present

## 2019-07-25 DIAGNOSIS — I10 Essential (primary) hypertension: Secondary | ICD-10-CM | POA: Diagnosis not present

## 2019-07-29 ENCOUNTER — Ambulatory Visit: Payer: Self-pay | Admitting: Neurology

## 2019-08-04 DIAGNOSIS — R079 Chest pain, unspecified: Secondary | ICD-10-CM | POA: Diagnosis not present

## 2019-08-04 DIAGNOSIS — I10 Essential (primary) hypertension: Secondary | ICD-10-CM | POA: Diagnosis not present

## 2019-08-29 NOTE — H&P (Signed)
Surgical History & Physical  Patient Name: Debra Vasquez DOB: 29-Feb-1946  Surgery: Cataract extraction with intraocular lens implant phacoemulsification; Right Eye  Surgeon: Baruch Goldmann MD Surgery Date:  09/13/2019 Pre-Op Date:  08/28/2019  HPI: A 56 Yr. old female patient 1. 1. The patient complains of difficulty when viewing TV, reading closed caption, news scrolls on TV, which began 6 months ago. Both eyes are affected. The episode is gradual. The condition's severity increased since last visit. Symptoms occur when the patient is driving, inside and reading. The complaint is associated with glare. This is negatively affecting the patient's quality of life. HPI was performed by Baruch Goldmann .  Medical History: Cataracts Diabetes High Blood Pressure  Review of Systems Negative Allergic/Immunologic Negative Cardiovascular Negative Constitutional Negative Ear, Nose, Mouth & Throat Negative Endocrine Negative Eyes Negative Gastrointestinal Negative Genitourinary Negative Hemotologic/Lymphatic Negative Integumentary Negative Musculoskeletal Negative Neurological Negative Psychiatry Negative Respiratory  Social   Former smoker   Medication Allopurinol, Spironolactone, Glipizide, Spironolactone, Metoprolol, Rosuvastatin, Metformin, Valsartan, Clonidine,   Sx/Procedures Carpal Tunnel, Hysterectomy, Tubal Ligation, Gallbladder Removal,   Drug Allergies  Lipitor, Gabapentin,   History & Physical: Heent:  Cataract, Right eye NECK: supple without bruits LUNGS: lungs clear to auscultation CV: regular rate and rhythm Abdomen: soft and non-tender  Impression & Plan: Assessment: 1.  COMBINED FORMS AGE RELATED CATARACT; Both Eyes (H25.813) 2.  Diabetes Type 2 No retinopathy (E11.9)  Plan: 1.  Cataract accounts for the patient's decreased vision. This visual impairment is not correctable with a tolerable change in glasses or contact lenses. Cataract surgery with an  implantation of a new lens should significantly improve the visual and functional status of the patient. Discussed all risks, benefits, alternatives, and potential complications. Discussed the procedures and recovery. Patient desires to have surgery. A-scan ordered and performed today for intra-ocular lens calculations. The surgery will be performed in order to improve vision for driving, reading, and for eye examinations. Recommend phacoemulsification with intra-ocular lens. Right Eye worse - first. Dilates well - shugarcaine by protocol. 2.  Stressed importance of blood sugar and blood pressure control, and also yearly eye examinations. Discussed the need for ongoing proactive ocular exams and treatment, hopefully before visual symptoms develop.

## 2019-09-06 DIAGNOSIS — H25811 Combined forms of age-related cataract, right eye: Secondary | ICD-10-CM | POA: Diagnosis not present

## 2019-09-09 ENCOUNTER — Encounter (HOSPITAL_COMMUNITY): Payer: Self-pay

## 2019-09-09 NOTE — Patient Instructions (Addendum)
Your procedure is scheduled on:  09/13/2019               Report to [facility] at   7:45  AM.  Call this number if you have problems the morning of surgery: [phone]   Remember:   Do not eat or drink :After Midnight.    Take these medicines the morning of surgery with A SIP OF WATER:  Metoprolol, allopurinol, clonidine.          Do not wear jewelry, make-up or nail polish.  Do not wear lotions, powders, or perfumes. You may wear deodorant and brush your teeth.  Do not bring valuables to the hospital.  Contacts, dentures or bridgework may not be worn into surgery.  Patients discharged the day of surgery will not be allowed to drive home.  Name and phone number of your driver.                                                                                                                                       Cataract Surgery  A cataract is a clouding of the lens of the eye. When a lens becomes cloudy, vision is reduced based on the degree and nature of the clouding. Surgery may be needed to improve vision. Surgery removes the cloudy lens and usually replaces it with a substitute lens (intraocular lens, IOL). LET YOUR EYE DOCTOR KNOW ABOUT:  Allergies to food or medicine.   Medicines taken including herbs, eyedrops, over-the-counter medicines, and creams.   Use of steroids (by mouth or creams).   Previous problems with anesthetics or numbing medicine.   History of bleeding problems or blood clots.   Previous surgery.   Other health problems, including diabetes and kidney problems.   Possibility of pregnancy, if this applies.  RISKS AND COMPLICATIONS  Infection.   Inflammation of the eyeball (endophthalmitis) that can spread to both eyes (sympathetic ophthalmia).   Poor wound healing.   If an IOL is inserted, it can later fall out of proper position. This is very uncommon.   Clouding of the part of your eye that holds an IOL in place. This is called an "after-cataract."  These are uncommon, but easily treated.  BEFORE THE PROCEDURE  Do not eat or drink anything except small amounts of water for 8 to 12 before your surgery, or as directed by your caregiver.    Unless you are told otherwise, continue any eyedrops you have been prescribed.   Talk to your primary caregiver about all other medicines that you take (both prescription and non-prescription). In some cases, you may need to stop or change medicines near the time of your surgery. This is most important if you are taking blood-thinning medicine. Do not stop medicines unless you are told to do so.   Arrange for someone to drive you to and from the procedure.   Do not put  contact lenses in either eye on the day of your surgery.  PROCEDURE There is more than one method for safely removing a cataract. Your doctor can explain the differences and help determine which is best for you. Phacoemulsification surgery is the most common form of cataract surgery.  An injection is given behind the eye or eyedrops are given to make this a painless procedure.   A small cut (incision) is made on the edge of the clear, dome-shaped surface that covers the front of the eye (cornea).   A tiny probe is painlessly inserted into the eye. This device gives off ultrasound waves that soften and break up the cloudy center of the lens. This makes it easier for the cloudy lens to be removed by suction.   An IOL may be implanted.   The normal lens of the eye is covered by a clear capsule. Part of that capsule is intentionally left in the eye to support the IOL.   Your surgeon may or may not use stitches to close the incision.  There are other forms of cataract surgery that require a larger incision and stiches to close the eye. This approach is taken in cases where the doctor feels that the cataract cannot be easily removed using phacoemulsification. AFTER THE PROCEDURE  When an IOL is implanted, it does not need care. It becomes  a permanent part of your eye and cannot be seen or felt.   Your doctor will schedule follow-up exams to check on your progress.   Review your other medicines with your doctor to see which can be resumed after surgery.   Use eyedrops or take medicine as prescribed by your doctor.  Document Released: 12/01/2011 Document Reviewed: 11/28/2011 [facility] Patient Information 2012 [place].  .Cataract Surgery Care After Refer to this sheet in the next few weeks. These instructions provide you with information on caring for yourself after your procedure. Your caregiver may also give you more specific instructions. Your treatment has been planned according to current medical practices, but problems sometimes occur. Call your caregiver if you have any problems or questions after your procedure.  HOME CARE INSTRUCTIONS   Avoid strenuous activities as directed by your caregiver.   Ask your caregiver when you can resume driving.   Use eyedrops or other medicines to help healing and control pressure inside your eye as directed by your caregiver.   Only take over-the-counter or prescription medicines for pain, discomfort, or fever as directed by your caregiver.   Do not to touch or rub your eyes.   You may be instructed to use a protective shield during the first few days and nights after surgery. If not, wear sunglasses to protect your eyes. This is to protect the eye from pressure or from being accidentally bumped.   Keep the area around your eye clean and dry. Avoid swimming or allowing water to hit you directly in the face while showering. Keep soap and shampoo out of your eyes.   Do not bend or lift heavy objects. Bending increases pressure in the eye. You can walk, climb stairs, and do light household chores.   Do not put a contact lens into the eye that had surgery until your caregiver says it is okay to do so.   Ask your doctor when you can return to work. This will depend on the kind  of work that you do. If you work in a dusty environment, you may be advised to wear protective eyewear for  a period of time.   Ask your caregiver when it will be safe to engage in sexual activity.   Continue with your regular eye exams as directed by your caregiver.  What to expect:  It is normal to feel itching and mild discomfort for a few days after cataract surgery. Some fluid discharge is also common, and your eye may be sensitive to light and touch.   After 1 to 2 days, even moderate discomfort should disappear. In most cases, healing will take about 6 weeks.   If you received an intraocular lens (IOL), you may notice that colors are very bright or have a blue tinge. Also, if you have been in bright sunlight, everything may appear reddish for a few hours. If you see these color tinges, it is because your lens is clear and no longer cloudy. Within a few months after receiving an IOL, these extra colors should go away. When you have healed, you will probably need new glasses.  SEEK MEDICAL CARE IF:   You have increased bruising around your eye.   You have discomfort not helped by medicine.  SEEK IMMEDIATE MEDICAL CARE IF:   You have a  fever.   You have a worsening or sudden vision loss.   You have redness, swelling, or increasing pain in the eye.   You have a thick discharge from the eye that had surgery.  MAKE SURE YOU:  Understand these instructions.   Will watch your condition.   Will get help right away if you are not doing well or get worse.  Document Released: 07/01/2005 Document Revised: 12/01/2011 Document Reviewed: 08/05/2011 [facility] Patient Information 2012 [place].    Monitored Anesthesia Care  Monitored anesthesia care is an anesthesia service for a medical procedure. Anesthesia is the loss of the ability to feel pain. It is produced by medications called anesthetics. It may affect a small area of your body (local anesthesia), a large area of your body  (regional anesthesia), or your entire body (general anesthesia). The need for monitored anesthesia care depends your procedure, your condition, and the potential need for regional or general anesthesia. It is often provided during procedures where:   General anesthesia may be needed if there are complications. This is because you need special care when you are under general anesthesia.    You will be under local or regional anesthesia. This is so that you are able to have higher levels of anesthesia if needed.    You will receive calming medications (sedatives). This is especially the case if sedatives are given to put you in a semi-conscious state of relaxation (deep sedation). This is because the amount of sedative needed to produce this state can be hard to predict. Too much of a sedative can produce general anesthesia. Monitored anesthesia care is performed by one or more caregivers who have special training in all types of anesthesia. You will need to meet with these caregivers before your procedure. During this meeting, they will ask you about your medical history. They will also give you instructions to follow. (For example, you will need to stop eating and drinking before your procedure. You may also need to stop or change medications you are taking.) During your procedure, your caregivers will stay with you. They will:   Watch your condition. This includes watching you blood pressure, breathing, and level of pain.    Diagnose and treat problems that occur.    Give medications if they are needed. These may  include calming medications (sedatives) and anesthetics.    Make sure you are comfortable.   Having monitored anesthesia care does not necessarily mean that you will be under anesthesia. It does mean that your caregivers will be able to manage anesthesia if you need it or if it occurs. It also means that you will be able to have a different type of anesthesia than you are having if you need  it. When your procedure is complete, your caregivers will continue to watch your condition. They will make sure any medications wear off before you are allowed to go home.  Document Released: 09/07/2005 Document Revised: 04/08/2013 Document Reviewed: 01/23/2013 [facility] Patient Information 2014 [place], [place].    Cataract Surgery, Care After This sheet gives you information about how to care for yourself after your procedure. Your health care provider may also give you more specific instructions. If you have problems or questions, contact your health care provider. What can I expect after the procedure? After the procedure, it is common to have:  Itching.  Discomfort.  Fluid discharge.  Sensitivity to light and to touch.  Bruising in or around the eye.  Mild blurred vision. Follow these instructions at home: Eye care   Do not touch or rub your eyes.  Protect your eyes as told by your health care provider. You may be told to wear a protective eye shield or sunglasses.  Do not put a contact lens into the affected eye or eyes until your health care provider approves.  Keep the area around your eye clean and dry: ? Avoid swimming. ? Do not allow water to hit you directly in the face while showering. ? Keep soap and shampoo out of your eyes.  Check your eye every day for signs of infection. Watch for: ? Redness, swelling, or pain. ? Fluid, blood, or pus. ? Warmth. ? A bad smell. ? Vision that is getting worse. ? Sensitivity that is getting worse. Activity  Do not drive for 24 hours if you were given a sedative during your procedure.  Avoid strenuous activities, such as playing contact sports, for as long as told by your health care provider.  Do not drive or use heavy machinery until your health care provider approves.  Do not bend or lift heavy objects. Bending increases pressure in the eye. You can walk, climb stairs, and do light household chores.  Ask your health  care provider when you can return to work. If you work in a dusty environment, you may be advised to wear protective eyewear for a period of time. General instructions  Take or apply over-the-counter and prescription medicines only as told by your health care provider. This includes eye drops.  Keep all follow-up visits as told by your health care provider. This is important. Contact a health care provider if:  You have increased bruising around your eye.  You have pain that is not helped with medicine.  You have a fever.  You have redness, swelling, or pain in your eye.  You have fluid, blood, or pus coming from your incision.  Your vision gets worse.  Your sensitivity to light gets worse. Get help right away if:  You have sudden loss of vision.  You see flashes of light or spots (floaters).  You have severe eye pain.  You develop nausea or vomiting. Summary  After your procedure, it is common to have itching, discomfort, bruising, fluid discharge, or sensitivity to light.  Follow instructions from your health care  provider about caring for your eye after the procedure.  Do not rub your eye after the procedure. You may need to wear eye protection or sunglasses. Do not wear contact lenses. Keep the area around your eye clean and dry.  Avoid activities that require a lot of effort. These include playing sports and lifting heavy objects.  Contact a health care provider if you have increased bruising, pain that does not go away, or a fever. Get help right away if you suddenly lose your vision, see flashes of light or spots, or have severe pain in the eye. This information is not intended to replace advice given to you by your health care provider. Make sure you discuss any questions you have with your health care provider. Document Released: 07/01/2005 Document Revised: 06/11/2018 Document Reviewed: 06/11/2018 Elsevier Patient Education  2020 [employer].

## 2019-09-10 ENCOUNTER — Encounter (HOSPITAL_COMMUNITY): Payer: Self-pay

## 2019-09-10 ENCOUNTER — Other Ambulatory Visit (HOSPITAL_COMMUNITY)
Admission: RE | Admit: 2019-09-10 | Discharge: 2019-09-10 | Disposition: A | Discharge status: Home / Self Care | Payer: Medicare Other | Source: Ambulatory Visit | Attending: Ophthalmology | Admitting: Ophthalmology

## 2019-09-10 ENCOUNTER — Encounter (HOSPITAL_COMMUNITY)
Admission: RE | Admit: 2019-09-10 | Discharge: 2019-09-10 | Disposition: A | Discharge status: Home / Self Care | Payer: Medicare Other | Source: Ambulatory Visit | Attending: Ophthalmology | Admitting: Ophthalmology

## 2019-09-10 ENCOUNTER — Other Ambulatory Visit: Payer: Self-pay

## 2019-09-10 DIAGNOSIS — Z01812 Encounter for preprocedural laboratory examination: Secondary | ICD-10-CM | POA: Diagnosis not present

## 2019-09-10 HISTORY — DX: Gout, unspecified: M10.9

## 2019-09-10 HISTORY — DX: Essential (primary) hypertension: I10

## 2019-09-10 HISTORY — DX: Type 2 diabetes mellitus without complications: E11.9

## 2019-09-10 LAB — GLUCOSE, CAPILLARY: Glucose-Capillary: 159 mg/dL — ABNORMAL HIGH (ref 70–99)

## 2019-09-10 LAB — HEMOGLOBIN A1C
Hgb A1c MFr Bld: 7.5 % — ABNORMAL HIGH (ref 4.8–5.6)
Mean Plasma Glucose: 169 mg/dL

## 2019-09-10 LAB — BASIC METABOLIC PANEL
Anion gap: 10 (ref 5–15)
BUN: 27 mg/dL — ABNORMAL HIGH (ref 8–23)
CO2: 23 mmol/L (ref 22–32)
Calcium: 9.4 mg/dL (ref 8.9–10.3)
Chloride: 104 mmol/L (ref 98–111)
Creatinine, Ser: 1.12 mg/dL — ABNORMAL HIGH (ref 0.44–1.00)
GFR calc Af Amer: 57 mL/min — ABNORMAL LOW (ref >60)
GFR calc non Af Amer: 49 mL/min — ABNORMAL LOW (ref >60)
Glucose, Bld: 147 mg/dL — ABNORMAL HIGH (ref 70–99)
Potassium: 4.3 mmol/L (ref 3.5–5.1)
Sodium: 137 mmol/L (ref 135–145)

## 2019-09-10 LAB — SARS CORONAVIRUS 2 (TAT 6-24 HRS): SARS Coronavirus 2: NEGATIVE

## 2019-09-11 NOTE — Pre-Procedure Instructions (Signed)
HgbA1C routed to PCP. 

## 2019-09-13 ENCOUNTER — Ambulatory Visit (HOSPITAL_COMMUNITY)
Admission: RE | Admit: 2019-09-13 | Discharge: 2019-09-13 | Disposition: A | Discharge status: Home / Self Care | Payer: Medicare Other | Attending: Ophthalmology | Admitting: Ophthalmology

## 2019-09-13 ENCOUNTER — Ambulatory Visit (HOSPITAL_COMMUNITY): Payer: Medicare Other | Admitting: Anesthesiology

## 2019-09-13 ENCOUNTER — Encounter (HOSPITAL_COMMUNITY): Payer: Self-pay | Admitting: *Deleted

## 2019-09-13 ENCOUNTER — Encounter (HOSPITAL_COMMUNITY)
Admission: RE | Disposition: A | Discharge status: Home / Self Care | Payer: Self-pay | Source: Home / Self Care | Attending: Ophthalmology

## 2019-09-13 DIAGNOSIS — I10 Essential (primary) hypertension: Secondary | ICD-10-CM | POA: Diagnosis not present

## 2019-09-13 DIAGNOSIS — E1136 Type 2 diabetes mellitus with diabetic cataract: Secondary | ICD-10-CM | POA: Insufficient documentation

## 2019-09-13 DIAGNOSIS — H25811 Combined forms of age-related cataract, right eye: Secondary | ICD-10-CM | POA: Diagnosis not present

## 2019-09-13 DIAGNOSIS — H25813 Combined forms of age-related cataract, bilateral: Secondary | ICD-10-CM | POA: Insufficient documentation

## 2019-09-13 DIAGNOSIS — Z87891 Personal history of nicotine dependence: Secondary | ICD-10-CM | POA: Diagnosis not present

## 2019-09-13 HISTORY — PX: CATARACT EXTRACTION W/PHACO: SHX586

## 2019-09-13 LAB — GLUCOSE, CAPILLARY: Glucose-Capillary: 163 mg/dL — ABNORMAL HIGH (ref 70–99)

## 2019-09-13 SURGERY — PHACOEMULSIFICATION, CATARACT, WITH IOL INSERTION
Anesthesia: Monitor Anesthesia Care | Site: Eye | Laterality: Right

## 2019-09-13 MED ORDER — CYCLOPENTOLATE-PHENYLEPHRINE 0.2-1 % OP SOLN
1.0000 [drp] | OPHTHALMIC | Status: AC | PRN
  Administered 2019-09-13 (×3): 1 [drp] via OPHTHALMIC

## 2019-09-13 MED ORDER — LIDOCAINE HCL (PF) 1 % IJ SOLN
INTRAOCULAR | Status: DC | PRN
  Administered 2019-09-13: 10:00:00 1 mL via OPHTHALMIC

## 2019-09-13 MED ORDER — PHENYLEPHRINE HCL 2.5 % OP SOLN
1.0000 [drp] | OPHTHALMIC | Status: AC | PRN
  Administered 2019-09-13 (×3): 1 [drp] via OPHTHALMIC

## 2019-09-13 MED ORDER — NEOMYCIN-POLYMYXIN-DEXAMETH 3.5-10000-0.1 OP SUSP
OPHTHALMIC | Status: DC | PRN
  Administered 2019-09-13: 1 [drp] via OPHTHALMIC

## 2019-09-13 MED ORDER — TETRACAINE HCL 0.5 % OP SOLN
1.0000 [drp] | OPHTHALMIC | Status: AC | PRN
  Administered 2019-09-13 (×3): 1 [drp] via OPHTHALMIC

## 2019-09-13 MED ORDER — LIDOCAINE HCL 3.5 % OP GEL
1.0000 "application " | Freq: Once | OPHTHALMIC | Status: AC
  Administered 2019-09-13: 1 via OPHTHALMIC

## 2019-09-13 MED ORDER — POVIDONE-IODINE 5 % OP SOLN
OPHTHALMIC | Status: DC | PRN
  Administered 2019-09-13: 1 via OPHTHALMIC

## 2019-09-13 MED ORDER — BSS IO SOLN
INTRAOCULAR | Status: DC | PRN
  Administered 2019-09-13: 15 mL

## 2019-09-13 MED ORDER — EPINEPHRINE PF 1 MG/ML IJ SOLN
INTRAOCULAR | Status: DC | PRN
  Administered 2019-09-13: 10:00:00 500 mL

## 2019-09-13 MED ORDER — SODIUM HYALURONATE 23 MG/ML IO SOLN
INTRAOCULAR | Status: DC | PRN
  Administered 2019-09-13: 0.6 mL via INTRAOCULAR

## 2019-09-13 MED ORDER — PROVISC 10 MG/ML IO SOLN
INTRAOCULAR | Status: DC | PRN
  Administered 2019-09-13: 0.85 mL via INTRAOCULAR

## 2019-09-13 SURGICAL SUPPLY — 12 items

## 2019-09-13 NOTE — Op Note (Signed)
Date of procedure: 09/13/19  Pre-operative diagnosis:  Visually significant combined form age-related cataract, Right Eye (H25.811)  Post-operative diagnosis:  Visually significant combined form age-related cataract, Right Eye (H25.811)  Procedure: Removal of cataract via phacoemulsification and insertion of intra-ocular lens [employer] PCB00  +20.0D into the capsular bag of the Right Eye  Attending surgeon: Gerda Diss. Tysheem Accardo, MD, MA  Anesthesia: MAC, Topical Akten  Complications: None  Estimated Blood Loss: <3m (minimal)  Specimens: None  Implants: As above  Indications:  Visually significant age-related cataract, Right Eye  Procedure:  The patient was seen and identified in the pre-operative area. The operative eye was identified and dilated.  The operative eye was marked.  Topical anesthesia was administered to the operative eye.     The patient was then to the operative suite and placed in the supine position.  A timeout was performed confirming the patient, procedure to be performed, and all other relevant information.   The patient's face was prepped and draped in the usual fashion for intra-ocular surgery.  A lid speculum was placed into the operative eye and the surgical microscope moved into place and focused.  A superotemporal paracentesis was created using a 20 gauge paracentesis blade.  Shugarcaine was injected into the anterior chamber.  Viscoelastic was injected into the anterior chamber.  A temporal clear-corneal main wound incision was created using a 2.490mmicrokeratome.  A continuous curvilinear capsulorrhexis was initiated using an irrigating cystitome and completed using capsulorrhexis forceps.  Hydrodissection and hydrodeliniation were performed.  Viscoelastic was injected into the anterior chamber.  A phacoemulsification handpiece and a chopper as a second instrument were used to remove the nucleus and epinucleus. The irrigation/aspiration handpiece was  used to remove any remaining cortical material.   The capsular bag was reinflated with viscoelastic, checked, and found to be intact.  The intraocular lens was inserted into the capsular bag.  The irrigation/aspiration handpiece was used to remove any remaining viscoelastic.  The clear corneal wound and paracentesis wounds were then hydrated and checked with Weck-Cels to be watertight.  The lid-speculum and drape was removed, and the patient's face was cleaned with a wet and dry 4x4.  Maxitrol was instilled in the eye before a clear shield was taped over the eye. The patient was taken to the post-operative care unit in good condition, having tolerated the procedure well.  Post-Op Instructions: [facility] and will receive all other orders and instructions.

## 2019-09-13 NOTE — Anesthesia Postprocedure Evaluation (Signed)
Anesthesia Post Note  Patient: Debra Vasquez  Procedure(s) Performed: CATARACT EXTRACTION PHACO AND INTRAOCULAR LENS PLACEMENT RIGHT EYE (Right Eye)  Patient location during evaluation: Short Stay Anesthesia Type: MAC Level of consciousness: awake and alert Pain management: pain level controlled Vital Signs Assessment: post-procedure vital signs reviewed and stable Respiratory status: spontaneous breathing Cardiovascular status: stable Postop Assessment: no apparent nausea or vomiting Anesthetic complications: no     Last Vitals:  Vitals:   09/13/19 0835  BP: (!) 165/86  Pulse: (!) 59  Resp: 20  Temp: 36.7 C  SpO2: 100%    Last Pain:  Vitals:   09/13/19 0835  TempSrc: Oral  PainSc: 0-No pain                 Shaylah Mcghie

## 2019-09-13 NOTE — Anesthesia Preprocedure Evaluation (Signed)
Anesthesia Evaluation  Patient identified by MRN, date of birth, ID band Patient awake    Reviewed: Allergy & Precautions, NPO status , Patient's Chart, lab work & pertinent test results, reviewed documented beta blocker date and time   Airway Mallampati: III  TM Distance: >3 FB Neck ROM: Full    Dental  (+) Upper Dentures, Partial Lower   Pulmonary former smoker,    Pulmonary exam normal breath sounds clear to auscultation       Cardiovascular Exercise Tolerance: Good METS: 3 - Mets hypertension (took metoprolol this morning), Pt. on medications and Pt. on home beta blockers Normal cardiovascular exam Rhythm:Regular Rate:Normal     Neuro/Psych negative psych ROS   GI/Hepatic negative GI ROS, Neg liver ROS,   Endo/Other  diabetes, Well Controlled, Type 2  Renal/GU negative Renal ROS     Musculoskeletal negative musculoskeletal ROS (+)   Abdominal   Peds  Hematology negative hematology ROS (+)   Anesthesia Other Findings   Reproductive/Obstetrics negative OB ROS                            Anesthesia Physical Anesthesia Plan  ASA: II  Anesthesia Plan: MAC   Post-op Pain Management:    Induction:   PONV Risk Score and Plan:   Airway Management Planned:   Additional Equipment:   Intra-op Plan:   Post-operative Plan:   Informed Consent: I have reviewed the patients History and Physical, chart, labs and discussed the procedure including the risks, benefits and alternatives for the proposed anesthesia with the patient or authorized representative who has indicated his/her understanding and acceptance.     Dental advisory given  Plan Discussed with: CRNA  Anesthesia Plan Comments:         Anesthesia Quick Evaluation

## 2019-09-13 NOTE — Discharge Instructions (Signed)
Please discharge patient when stable, will follow up today with Dr. Owyn Raulston at the [facility] office immediately following discharge.  Leave shield in place until visit.  All paperwork with discharge instructions will be given at the office. ° °

## 2019-09-13 NOTE — Transfer of Care (Signed)
Immediate Anesthesia Transfer of Care Note  Patient: Debra Vasquez  Procedure(s) Performed: CATARACT EXTRACTION PHACO AND INTRAOCULAR LENS PLACEMENT RIGHT EYE (Right Eye)  Patient Location: Short Stay  Anesthesia Type:MAC  Level of Consciousness: awake, alert  and patient cooperative  Airway & Oxygen Therapy: Patient Spontanous Breathing  Post-op Assessment: Report given to RN and Post -op Vital signs reviewed and stable  Post vital signs: Reviewed and stable  Last Vitals:  Vitals Value Taken Time  BP    Temp    Pulse    Resp    SpO2      Last Pain:  Vitals:   09/13/19 0835  TempSrc: Oral  PainSc: 0-No pain      Patients Stated Pain Goal: 7 (11/[id number]/56 [id number])  Complications: No apparent anesthesia complications

## 2019-09-13 NOTE — Anesthesia Procedure Notes (Signed)
Procedure Name: MAC Date/Time: 09/13/2019 9:25 AM Performed by: Vista Deck, CRNA Pre-anesthesia Checklist: Patient identified, Emergency Drugs available, Suction available, Timeout performed and Patient being monitored Patient Re-evaluated:Patient Re-evaluated prior to induction Oxygen Delivery Method: Nasal Cannula

## 2019-09-13 NOTE — Interval H&P Note (Signed)
History and Physical Interval Note: The H and P was reviewed and updated. The patient was examined.  No changes were found after exam.  The surgical eye was marked.  09/13/2019 9:22 AM  [place]  has presented today for surgery, with the diagnosis of nuclear cataract right eye.  The various methods of treatment have been discussed with the patient and family. After consideration of risks, benefits and other options for treatment, the patient has consented to  Procedure(s) with comments: CATARACT EXTRACTION PHACO AND INTRAOCULAR LENS PLACEMENT RIGHT EYE (Right) - right as a surgical intervention.  The patient's history has been reviewed, patient examined, no change in status, stable for surgery.  I have reviewed the patient's chart and labs.  Questions were answered to the patient's satisfaction.     Baruch Goldmann

## 2019-09-16 ENCOUNTER — Encounter (HOSPITAL_COMMUNITY): Payer: Self-pay | Admitting: Ophthalmology

## 2019-09-19 DIAGNOSIS — H25812 Combined forms of age-related cataract, left eye: Secondary | ICD-10-CM | POA: Diagnosis not present

## 2019-09-20 NOTE — H&P (Signed)
Surgical History & Physical  Patient Name: Debra Vasquez DOB: [birth year]  Surgery: Cataract extraction with intraocular lens implant phacoemulsification; Left Eye  Surgeon: Baruch Goldmann MD Surgery Date:  09/27/2019 Pre-Op Date:  09/19/2019  HPI: A 65 Yr. old female patient 1. 1. The patient is returning after cataract surgery. The right eye is affected. Status post cataract surgery, which began 1 week ago: The patient's vision is improved. Vision is very blurry OS. Pt states it would be hard to see the writing on TV with OS. This is negatively affecting the patient's quality of life. HPI was performed by Baruch Goldmann .  Medical History: Cataracts Diabetes High Blood Pressure  Review of Systems Negative Allergic/Immunologic Negative Cardiovascular Negative Constitutional Negative Ear, Nose, Mouth & Throat Negative Endocrine Negative Eyes Negative Gastrointestinal Negative Genitourinary Negative Hemotologic/Lymphatic Negative Integumentary Negative Musculoskeletal Negative Neurological Negative Psychiatry Negative Respiratory  Social   Former smoker   Medication Prednisolon-gatiflox-bromfenac,  Allopurinol, Spironolactone, Glipizide, Spironolactone, Metoprolol, Rosuvastatin, Metformin, Valsartan, Clonidine,   Sx/Procedures Phaco c IOL,  Carpal Tunnel, Hysterectomy, Tubal Ligation, Gallbladder Removal,   Drug Allergies  Lipitor, Gabapentin,   History & Physical: Heent:  Cataract, Left eye NECK: supple without bruits LUNGS: lungs clear to auscultation CV: regular rate and rhythm Abdomen: soft and non-tender  Impression & Plan: Assessment: 1.  CATARACT EXTRACTION STATUS; Right Eye (Z98.41) 2.  COMBINED FORMS AGE RELATED CATARACT; Left Eye (H25.812)  Plan: 1.  1 week after cataract surgery. Doing well with improved vision and normal eye pressure. Call with any problems or concerns. Continue Gati-Brom-Pred 2x/day for 3 more weeks. 2.  Cataract accounts for  the patient's decreased vision. This visual impairment is not correctable with a tolerable change in glasses or contact lenses. Cataract surgery with an implantation of a new lens should significantly improve the visual and functional status of the patient. Discussed all risks, benefits, alternatives, and potential complications. Discussed the procedures and recovery. Patient desires to have surgery. A-scan ordered and performed today for intra-ocular lens calculations. The surgery will be performed in order to improve vision for driving, reading, and for eye examinations. Recommend phacoemulsification with intra-ocular lens. Left Eye. Surgery required to correct imbalance of vision. Dilates well - shugarcaine by protocol.

## 2019-09-24 ENCOUNTER — Encounter (HOSPITAL_COMMUNITY): Payer: Self-pay

## 2019-09-24 ENCOUNTER — Other Ambulatory Visit: Payer: Self-pay

## 2019-09-25 ENCOUNTER — Encounter (HOSPITAL_COMMUNITY)
Admission: RE | Admit: 2019-09-25 | Discharge: 2019-09-25 | Disposition: A | Discharge status: Home / Self Care | Payer: Medicare Other | Source: Ambulatory Visit | Attending: Ophthalmology | Admitting: Ophthalmology

## 2019-09-25 ENCOUNTER — Other Ambulatory Visit (HOSPITAL_COMMUNITY)
Admission: RE | Admit: 2019-09-25 | Discharge: 2019-09-25 | Disposition: A | Discharge status: Home / Self Care | Payer: Medicare Other | Source: Ambulatory Visit | Attending: Ophthalmology | Admitting: Ophthalmology

## 2019-09-25 DIAGNOSIS — Z01812 Encounter for preprocedural laboratory examination: Secondary | ICD-10-CM | POA: Diagnosis not present

## 2019-09-25 DIAGNOSIS — Z20828 Contact with and (suspected) exposure to other viral communicable diseases: Secondary | ICD-10-CM | POA: Insufficient documentation

## 2019-09-26 LAB — SARS CORONAVIRUS 2 (TAT 6-24 HRS): SARS Coronavirus 2: NEGATIVE

## 2019-09-27 ENCOUNTER — Ambulatory Visit (HOSPITAL_COMMUNITY): Payer: Medicare Other | Admitting: Anesthesiology

## 2019-09-27 ENCOUNTER — Encounter (HOSPITAL_COMMUNITY)
Admission: RE | Disposition: A | Discharge status: Home / Self Care | Payer: Self-pay | Source: Home / Self Care | Attending: Ophthalmology

## 2019-09-27 ENCOUNTER — Encounter (HOSPITAL_COMMUNITY): Payer: Self-pay | Admitting: *Deleted

## 2019-09-27 ENCOUNTER — Ambulatory Visit (HOSPITAL_COMMUNITY)
Admission: RE | Admit: 2019-09-27 | Discharge: 2019-09-27 | Disposition: A | Discharge status: Home / Self Care | Payer: Medicare Other | Attending: Ophthalmology | Admitting: Ophthalmology

## 2019-09-27 DIAGNOSIS — Z888 Allergy status to other drugs, medicaments and biological substances status: Secondary | ICD-10-CM | POA: Insufficient documentation

## 2019-09-27 DIAGNOSIS — H25812 Combined forms of age-related cataract, left eye: Secondary | ICD-10-CM | POA: Diagnosis not present

## 2019-09-27 DIAGNOSIS — E1136 Type 2 diabetes mellitus with diabetic cataract: Secondary | ICD-10-CM | POA: Insufficient documentation

## 2019-09-27 DIAGNOSIS — Z87891 Personal history of nicotine dependence: Secondary | ICD-10-CM | POA: Insufficient documentation

## 2019-09-27 DIAGNOSIS — Z7984 Long term (current) use of oral hypoglycemic drugs: Secondary | ICD-10-CM | POA: Insufficient documentation

## 2019-09-27 DIAGNOSIS — Z79899 Other long term (current) drug therapy: Secondary | ICD-10-CM | POA: Insufficient documentation

## 2019-09-27 DIAGNOSIS — I10 Essential (primary) hypertension: Secondary | ICD-10-CM | POA: Insufficient documentation

## 2019-09-27 DIAGNOSIS — Z961 Presence of intraocular lens: Secondary | ICD-10-CM | POA: Diagnosis not present

## 2019-09-27 DIAGNOSIS — Z9841 Cataract extraction status, right eye: Secondary | ICD-10-CM | POA: Insufficient documentation

## 2019-09-27 HISTORY — PX: CATARACT EXTRACTION W/PHACO: SHX586

## 2019-09-27 LAB — GLUCOSE, CAPILLARY: Glucose-Capillary: 130 mg/dL — ABNORMAL HIGH (ref 70–99)

## 2019-09-27 SURGERY — PHACOEMULSIFICATION, CATARACT, WITH IOL INSERTION
Anesthesia: Monitor Anesthesia Care | Site: Eye | Laterality: Left

## 2019-09-27 MED ORDER — POVIDONE-IODINE 5 % OP SOLN
OPHTHALMIC | Status: DC | PRN
  Administered 2019-09-27: 1 via OPHTHALMIC

## 2019-09-27 MED ORDER — PHENYLEPHRINE HCL 2.5 % OP SOLN
1.0000 [drp] | OPHTHALMIC | Status: AC | PRN
  Administered 2019-09-27 (×3): 1 [drp] via OPHTHALMIC

## 2019-09-27 MED ORDER — SODIUM HYALURONATE 23 MG/ML IO SOLN
INTRAOCULAR | Status: DC | PRN
  Administered 2019-09-27: 0.6 mL via INTRAOCULAR

## 2019-09-27 MED ORDER — NEOMYCIN-POLYMYXIN-DEXAMETH 3.5-10000-0.1 OP SUSP
OPHTHALMIC | Status: DC | PRN
  Administered 2019-09-27: 1 [drp] via OPHTHALMIC

## 2019-09-27 MED ORDER — CYCLOPENTOLATE-PHENYLEPHRINE 0.2-1 % OP SOLN
1.0000 [drp] | OPHTHALMIC | Status: AC | PRN
  Administered 2019-09-27 (×3): 1 [drp] via OPHTHALMIC

## 2019-09-27 MED ORDER — PROVISC 10 MG/ML IO SOLN
INTRAOCULAR | Status: DC | PRN
  Administered 2019-09-27: 0.85 mL via INTRAOCULAR

## 2019-09-27 MED ORDER — BSS IO SOLN
INTRAOCULAR | Status: DC | PRN
  Administered 2019-09-27: 15 mL via INTRAOCULAR

## 2019-09-27 MED ORDER — LIDOCAINE HCL 3.5 % OP GEL
1.0000 "application " | Freq: Once | OPHTHALMIC | Status: AC
  Administered 2019-09-27: 1 via OPHTHALMIC

## 2019-09-27 MED ORDER — LIDOCAINE HCL (PF) 1 % IJ SOLN
INTRAOCULAR | Status: DC | PRN
  Administered 2019-09-27: 1 mL via OPHTHALMIC

## 2019-09-27 MED ORDER — EPINEPHRINE PF 1 MG/ML IJ SOLN
INTRAOCULAR | Status: DC | PRN
  Administered 2019-09-27: 13:00:00 500 mL

## 2019-09-27 MED ORDER — TETRACAINE HCL 0.5 % OP SOLN
1.0000 [drp] | OPHTHALMIC | Status: AC | PRN
  Administered 2019-09-27 (×3): 1 [drp] via OPHTHALMIC

## 2019-09-27 SURGICAL SUPPLY — 13 items

## 2019-09-27 NOTE — Anesthesia Preprocedure Evaluation (Signed)
Anesthesia Evaluation  Patient identified by MRN, date of birth, ID band Patient awake    Reviewed: Allergy & Precautions, NPO status , Patient's Chart, lab work & pertinent test results, reviewed documented beta blocker date and time   Airway Mallampati: II  TM Distance: >3 FB Neck ROM: Full    Dental no notable dental hx. (+) Teeth Intact   Pulmonary neg pulmonary ROS, former smoker,    Pulmonary exam normal breath sounds clear to auscultation       Cardiovascular Exercise Tolerance: Good hypertension, Pt. on medications and Pt. on home beta blockers negative cardio ROS Normal cardiovascular examI Rhythm:Regular Rate:Normal     Neuro/Psych negative neurological ROS  negative psych ROS   GI/Hepatic negative GI ROS, Neg liver ROS,   Endo/Other  negative endocrine ROSdiabetes, Type 2, Oral Hypoglycemic Agents  Renal/GU negative Renal ROS  negative genitourinary   Musculoskeletal negative musculoskeletal ROS (+)   Abdominal   Peds negative pediatric ROS (+)  Hematology negative hematology ROS (+)   Anesthesia Other Findings   Reproductive/Obstetrics negative OB ROS                             Anesthesia Physical Anesthesia Plan  ASA: II  Anesthesia Plan: MAC   Post-op Pain Management:    Induction: Intravenous  PONV Risk Score and Plan: 2 and TIVA, Treatment may vary due to age or medical condition and Ondansetron  Airway Management Planned:   Additional Equipment:   Intra-op Plan:   Post-operative Plan:   Informed Consent: I have reviewed the patients History and Physical, chart, labs and discussed the procedure including the risks, benefits and alternatives for the proposed anesthesia with the patient or authorized representative who has indicated his/her understanding and acceptance.     Dental advisory given  Plan Discussed with: CRNA  Anesthesia Plan Comments:  (Plan Full PPE use  Plan MAC d/w pt -WTP with same   Had other eye done 2 weeks ago without need for meds, wants to potentially take nothing again )        Anesthesia Quick Evaluation

## 2019-09-27 NOTE — Interval H&P Note (Signed)
History and Physical Interval Note: The H and P was reviewed and updated. The patient was examined.  No changes were found after exam.  The surgical eye was marked.  09/27/2019 12:57 PM  Debra Vasquez  has presented today for surgery, with the diagnosis of Nuclear sclerotic cataract - Left eye.  The various methods of treatment have been discussed with the patient and family. After consideration of risks, benefits and other options for treatment, the patient has consented to  Procedure(s) with comments: CATARACT EXTRACTION PHACO AND INTRAOCULAR LENS PLACEMENT LEFT EYE (Left) - left as a surgical intervention.  The patient's history has been reviewed, patient examined, no change in status, stable for surgery.  I have reviewed the patient's chart and labs.  Questions were answered to the patient's satisfaction.     Baruch Goldmann

## 2019-09-27 NOTE — Op Note (Signed)
Date of procedure: 09/27/19  Pre-operative diagnosis: Visually significant age-related combined cataract, Left Eye (H25.812)  Post-operative diagnosis: Visually significant age-related combined cataract, Left Eye (H25.812)  Procedure: Removal of cataract via phacoemulsification and insertion of intra-ocular lens [employer] PCB00  +21.0D into the capsular bag of the Left Eye  Attending surgeon: Gerda Diss. Hannan Hutmacher, MD, MA  Anesthesia: MAC, Topical Ak[place]  Complications: None  Estimated Blood Loss: <45m (minimal)  Specimens: None  Implants: As above  Indications:  Visually significant age-related cataract, Left Eye  Procedure:  The patient was seen and identified in the pre-operative area. The operative eye was identified and dilated.  The operative eye was marked.  Topical anesthesia was administered to the operative eye.     The patient was then to the operative suite and placed in the supine position.  A timeout was performed confirming the patient, procedure to be performed, and all other relevant information.   The patient's face was prepped and draped in the usual fashion for intra-ocular surgery.  A lid speculum was placed into the operative eye and the surgical microscope moved into place and focused.  An inferotemporal paracentesis was created using a 20 gauge paracentesis blade.  Shugarcaine was injected into the anterior chamber.  Viscoelastic was injected into the anterior chamber.  A temporal clear-corneal main wound incision was created using a 2.428mmicrokeratome.  A continuous curvilinear capsulorrhexis was initiated using an irrigating cystitome and completed using capsulorrhexis forceps.  Hydrodissection and hydrodeliniation were performed.  Viscoelastic was injected into the anterior chamber.  A phacoemulsification handpiece and a chopper as a second instrument were used to remove the nucleus and epinucleus. The irrigation/aspiration handpiece was used to remove  any remaining cortical material.   The capsular bag was reinflated with viscoelastic, checked, and found to be intact.  The intraocular lens was inserted into the capsular bag.  The irrigation/aspiration handpiece was used to remove any remaining viscoelastic.  The clear corneal wound and paracentesis wounds were then hydrated and checked with Weck-Cels to be watertight.  The lid-speculum and drape was removed, and the patient's face was cleaned with a wet and dry 4x4.  Maxitrol was instilled in the eye before a clear shield was taped over the eye. The patient was taken to the post-operative care unit in good condition, having tolerated the procedure well.  Post-Op Instructions: [facility] and will receive all other orders and instructions.

## 2019-09-27 NOTE — Discharge Instructions (Signed)
Please discharge patient when stable, will follow up today with Dr. Anarely Nicholls at the [facility] office immediately following discharge.  Leave shield in place until visit.  All paperwork with discharge instructions will be given at the office. ° °

## 2019-09-27 NOTE — Transfer of Care (Signed)
Immediate Anesthesia Transfer of Care Note  Patient: Debra Vasquez  Procedure(s) Performed: CATARACT EXTRACTION PHACO AND INTRAOCULAR LENS PLACEMENT LEFT EYE  (CDE: 8.04) (Left Eye)  Patient Location: Short Stay  Anesthesia Type:MAC  Level of Consciousness: awake, alert , oriented and patient cooperative  Airway & Oxygen Therapy: Patient Spontanous Breathing  Post-op Assessment: Report given to RN and Post -op Vital signs reviewed and stable  Post vital signs: Reviewed and stable  Last Vitals:  Vitals Value Taken Time  BP    Temp    Pulse    Resp    SpO2      Last Pain:  Vitals:   09/27/19 1140  TempSrc: Oral  PainSc: 0-No pain         Complications: No apparent anesthesia complications

## 2019-09-27 NOTE — Anesthesia Postprocedure Evaluation (Signed)
Anesthesia Post Note  Patient: Debra Vasquez  Procedure(s) Performed: CATARACT EXTRACTION PHACO AND INTRAOCULAR LENS PLACEMENT LEFT EYE  (CDE: 8.04) (Left Eye)  Patient location during evaluation: Short Stay Anesthesia Type: MAC Level of consciousness: awake and alert Pain management: pain level controlled Vital Signs Assessment: post-procedure vital signs reviewed and stable Respiratory status: spontaneous breathing Cardiovascular status: stable Postop Assessment: no apparent nausea or vomiting Anesthetic complications: no     Last Vitals:  Vitals:   09/27/19 1140  BP: 125/62  Pulse: (!) 55  Resp: (!) 21  Temp: 36.8 C  SpO2: 96%    Last Pain:  Vitals:   09/27/19 1140  TempSrc: Oral  PainSc: 0-No pain                 Everette Rank

## 2019-09-30 ENCOUNTER — Encounter (HOSPITAL_COMMUNITY): Payer: Self-pay | Admitting: Ophthalmology

## 2019-10-09 DIAGNOSIS — J209 Acute bronchitis, unspecified: Secondary | ICD-10-CM | POA: Diagnosis not present

## 2019-11-05 DIAGNOSIS — E782 Mixed hyperlipidemia: Secondary | ICD-10-CM | POA: Diagnosis not present

## 2019-11-05 DIAGNOSIS — K7581 Nonalcoholic steatohepatitis (NASH): Secondary | ICD-10-CM | POA: Diagnosis not present

## 2019-11-05 DIAGNOSIS — E1122 Type 2 diabetes mellitus with diabetic chronic kidney disease: Secondary | ICD-10-CM | POA: Diagnosis not present

## 2019-11-05 DIAGNOSIS — I10 Essential (primary) hypertension: Secondary | ICD-10-CM | POA: Diagnosis not present

## 2019-11-05 DIAGNOSIS — E1169 Type 2 diabetes mellitus with other specified complication: Secondary | ICD-10-CM | POA: Diagnosis not present

## 2019-11-07 DIAGNOSIS — Z0001 Encounter for general adult medical examination with abnormal findings: Secondary | ICD-10-CM | POA: Diagnosis not present

## 2019-11-07 DIAGNOSIS — M1000 Idiopathic gout, unspecified site: Secondary | ICD-10-CM | POA: Diagnosis not present

## 2019-11-07 DIAGNOSIS — Z23 Encounter for immunization: Secondary | ICD-10-CM | POA: Diagnosis not present

## 2019-11-07 DIAGNOSIS — E1169 Type 2 diabetes mellitus with other specified complication: Secondary | ICD-10-CM | POA: Diagnosis not present

## 2019-11-07 DIAGNOSIS — K7581 Nonalcoholic steatohepatitis (NASH): Secondary | ICD-10-CM | POA: Diagnosis not present

## 2019-11-07 DIAGNOSIS — E739 Lactose intolerance, unspecified: Secondary | ICD-10-CM | POA: Diagnosis not present

## 2019-11-07 DIAGNOSIS — E1122 Type 2 diabetes mellitus with diabetic chronic kidney disease: Secondary | ICD-10-CM | POA: Diagnosis not present

## 2019-11-07 DIAGNOSIS — I10 Essential (primary) hypertension: Secondary | ICD-10-CM | POA: Diagnosis not present

## 2019-12-06 DIAGNOSIS — R1310 Dysphagia, unspecified: Secondary | ICD-10-CM | POA: Diagnosis not present

## 2020-01-23 DIAGNOSIS — Z1231 Encounter for screening mammogram for malignant neoplasm of breast: Secondary | ICD-10-CM | POA: Diagnosis not present

## 2020-02-06 DIAGNOSIS — M8588 Other specified disorders of bone density and structure, other site: Secondary | ICD-10-CM | POA: Diagnosis not present

## 2020-02-06 DIAGNOSIS — M810 Age-related osteoporosis without current pathological fracture: Secondary | ICD-10-CM | POA: Diagnosis not present

## 2020-02-06 DIAGNOSIS — Z13820 Encounter for screening for osteoporosis: Secondary | ICD-10-CM | POA: Diagnosis not present

## 2020-03-09 DIAGNOSIS — K7581 Nonalcoholic steatohepatitis (NASH): Secondary | ICD-10-CM | POA: Diagnosis not present

## 2020-03-09 DIAGNOSIS — I10 Essential (primary) hypertension: Secondary | ICD-10-CM | POA: Diagnosis not present

## 2020-03-09 DIAGNOSIS — E1122 Type 2 diabetes mellitus with diabetic chronic kidney disease: Secondary | ICD-10-CM | POA: Diagnosis not present

## 2020-03-09 DIAGNOSIS — N183 Chronic kidney disease, stage 3 unspecified: Secondary | ICD-10-CM | POA: Diagnosis not present

## 2020-03-09 DIAGNOSIS — E782 Mixed hyperlipidemia: Secondary | ICD-10-CM | POA: Diagnosis not present

## 2020-03-13 DIAGNOSIS — G43909 Migraine, unspecified, not intractable, without status migrainosus: Secondary | ICD-10-CM | POA: Diagnosis not present

## 2020-03-13 DIAGNOSIS — E1169 Type 2 diabetes mellitus with other specified complication: Secondary | ICD-10-CM | POA: Diagnosis not present

## 2020-03-13 DIAGNOSIS — M1000 Idiopathic gout, unspecified site: Secondary | ICD-10-CM | POA: Diagnosis not present

## 2020-03-13 DIAGNOSIS — K7581 Nonalcoholic steatohepatitis (NASH): Secondary | ICD-10-CM | POA: Diagnosis not present

## 2020-03-13 DIAGNOSIS — E1122 Type 2 diabetes mellitus with diabetic chronic kidney disease: Secondary | ICD-10-CM | POA: Diagnosis not present

## 2020-03-13 DIAGNOSIS — Z6828 Body mass index (BMI) 28.0-28.9, adult: Secondary | ICD-10-CM | POA: Diagnosis not present

## 2020-03-13 DIAGNOSIS — I10 Essential (primary) hypertension: Secondary | ICD-10-CM | POA: Diagnosis not present

## 2020-03-13 DIAGNOSIS — E739 Lactose intolerance, unspecified: Secondary | ICD-10-CM | POA: Diagnosis not present

## 2020-03-25 DIAGNOSIS — E7849 Other hyperlipidemia: Secondary | ICD-10-CM | POA: Diagnosis not present

## 2020-03-25 DIAGNOSIS — E1165 Type 2 diabetes mellitus with hyperglycemia: Secondary | ICD-10-CM | POA: Diagnosis not present

## 2020-03-25 DIAGNOSIS — I10 Essential (primary) hypertension: Secondary | ICD-10-CM | POA: Diagnosis not present

## 2020-05-19 DIAGNOSIS — M6530 Trigger finger, unspecified finger: Secondary | ICD-10-CM | POA: Diagnosis not present

## 2020-05-19 DIAGNOSIS — Z6828 Body mass index (BMI) 28.0-28.9, adult: Secondary | ICD-10-CM | POA: Diagnosis not present

## 2020-05-19 DIAGNOSIS — R0602 Shortness of breath: Secondary | ICD-10-CM | POA: Diagnosis not present

## 2020-05-22 DIAGNOSIS — Z961 Presence of intraocular lens: Secondary | ICD-10-CM | POA: Diagnosis not present

## 2020-05-22 DIAGNOSIS — H524 Presbyopia: Secondary | ICD-10-CM | POA: Diagnosis not present

## 2020-05-22 DIAGNOSIS — E119 Type 2 diabetes mellitus without complications: Secondary | ICD-10-CM | POA: Diagnosis not present

## 2020-05-22 DIAGNOSIS — Z7984 Long term (current) use of oral hypoglycemic drugs: Secondary | ICD-10-CM | POA: Diagnosis not present

## 2020-05-25 DIAGNOSIS — N183 Chronic kidney disease, stage 3 unspecified: Secondary | ICD-10-CM | POA: Diagnosis not present

## 2020-05-25 DIAGNOSIS — I129 Hypertensive chronic kidney disease with stage 1 through stage 4 chronic kidney disease, or unspecified chronic kidney disease: Secondary | ICD-10-CM | POA: Diagnosis not present

## 2020-05-25 DIAGNOSIS — E1122 Type 2 diabetes mellitus with diabetic chronic kidney disease: Secondary | ICD-10-CM | POA: Diagnosis not present

## 2020-05-25 DIAGNOSIS — M1000 Idiopathic gout, unspecified site: Secondary | ICD-10-CM | POA: Diagnosis not present

## 2020-05-25 DIAGNOSIS — E7849 Other hyperlipidemia: Secondary | ICD-10-CM | POA: Diagnosis not present

## 2020-05-26 DIAGNOSIS — M65332 Trigger finger, left middle finger: Secondary | ICD-10-CM | POA: Diagnosis not present

## 2020-05-26 DIAGNOSIS — M65331 Trigger finger, right middle finger: Secondary | ICD-10-CM | POA: Diagnosis not present

## 2020-06-04 DIAGNOSIS — I080 Rheumatic disorders of both mitral and aortic valves: Secondary | ICD-10-CM | POA: Diagnosis not present

## 2020-06-04 DIAGNOSIS — R0602 Shortness of breath: Secondary | ICD-10-CM | POA: Diagnosis not present

## 2020-06-04 DIAGNOSIS — R600 Localized edema: Secondary | ICD-10-CM | POA: Diagnosis not present

## 2020-06-18 ENCOUNTER — Telehealth: Payer: Self-pay | Admitting: *Deleted

## 2020-06-18 NOTE — Telephone Encounter (Signed)
   [facility] Pre-operative Risk Assessment    HEARTCARE STAFF: - Please ensure there is not already an duplicate clearance open for this procedure. - Under Visit Info/Reason for Call, type in Other and utilize the format Clearance MM/DD/YY or Clearance TBD. Do not use dashes or single digits. - If request is for dental extraction, please clarify the # of teeth to be extracted.  Request for surgical clearance:  1. What type of surgery is being performed? TRIGGER FINGER RELEASE   2. When is this surgery scheduled? TBD   3. What type of clearance is required (medical clearance vs. Pharmacy clearance to hold med vs. Both)? MEDICAL  4. Are there any medications that need to be held prior to surgery and how long? NONE LISTED   5. Practice name and name of physician performing surgery? Debra Vasquez; DR. Elsie Vasquez   6. What is the office phone number? [phone]    7.   What is the office fax number? [place].   Anesthesia type (None, local, MAC, general) ? CHOICE   Debra Vasquez 06/18/2020, 5:17 PM  _________________________________________________________________   (provider comments below)

## 2020-06-18 NOTE — Telephone Encounter (Signed)
Patient is new to [facility], has upcoming visit with Dr. Harl Bowie for preop clearance. Previous had cath under [facility] in 2014 which showed normal coronary arteries. Seen by [facility] cardiology [facility] Dr. Mike Craze in 2020, advised to followup as needed. She had a recent echo on 06/04/2020 under [facility] which was normal. Will defer to Dr. Harl Bowie for evaluation of dyspnea and cardiac clearance.   Will remove from preop pool.

## 2020-07-06 ENCOUNTER — Ambulatory Visit: Payer: Medicare Other | Admitting: Cardiology

## 2020-07-07 DIAGNOSIS — E739 Lactose intolerance, unspecified: Secondary | ICD-10-CM | POA: Diagnosis not present

## 2020-07-07 DIAGNOSIS — Z6828 Body mass index (BMI) 28.0-28.9, adult: Secondary | ICD-10-CM | POA: Diagnosis not present

## 2020-07-07 DIAGNOSIS — E782 Mixed hyperlipidemia: Secondary | ICD-10-CM | POA: Diagnosis not present

## 2020-07-07 DIAGNOSIS — G43909 Migraine, unspecified, not intractable, without status migrainosus: Secondary | ICD-10-CM | POA: Diagnosis not present

## 2020-07-07 DIAGNOSIS — M1000 Idiopathic gout, unspecified site: Secondary | ICD-10-CM | POA: Diagnosis not present

## 2020-07-07 DIAGNOSIS — E1122 Type 2 diabetes mellitus with diabetic chronic kidney disease: Secondary | ICD-10-CM | POA: Diagnosis not present

## 2020-07-07 DIAGNOSIS — I10 Essential (primary) hypertension: Secondary | ICD-10-CM | POA: Diagnosis not present

## 2020-07-07 DIAGNOSIS — K7581 Nonalcoholic steatohepatitis (NASH): Secondary | ICD-10-CM | POA: Diagnosis not present

## 2020-07-08 ENCOUNTER — Encounter: Payer: Self-pay | Admitting: *Deleted

## 2020-07-09 ENCOUNTER — Ambulatory Visit (INDEPENDENT_AMBULATORY_CARE_PROVIDER_SITE_OTHER): Payer: Medicare Other | Admitting: Cardiology

## 2020-07-09 ENCOUNTER — Encounter: Payer: Self-pay | Admitting: *Deleted

## 2020-07-09 ENCOUNTER — Encounter: Payer: Self-pay | Admitting: Cardiology

## 2020-07-09 VITALS — BP 142/82 | HR 60 | Ht 65.0 in | Wt 175.6 lb

## 2020-07-09 DIAGNOSIS — R0789 Other chest pain: Secondary | ICD-10-CM

## 2020-07-09 DIAGNOSIS — R0602 Shortness of breath: Secondary | ICD-10-CM

## 2020-07-09 MED ORDER — CHLORTHALIDONE 25 MG PO TABS: 12.5000 mg | ORAL_TABLET | Freq: Every day | ORAL | 1 refills | Status: DC

## 2020-07-09 MED ORDER — VALSARTAN 320 MG PO TABS: 320.0000 mg | ORAL_TABLET | Freq: Every day | ORAL | 1 refills | Status: DC

## 2020-07-09 NOTE — Patient Instructions (Signed)
Your physician recommends that you schedule a follow-up appointment in: [place] has recommended you make the following change in your medication:   STOP VALSARTAN/HCTZ  START VALSARTAN 320 MG DAILY   START CHLORTHALIDONE 12.5 MG (1/2 TABLET) DAILY  Your physician recommends that you return for lab work in: 2 WEEKS BMP/MG  Thank you for choosing [facility]!!

## 2020-07-09 NOTE — Progress Notes (Signed)
Clinical Summary Debra Vasquez is a 74 y.o.female seen today as a new patient.   1. Chest pain/SOB - previously seen by [facility] cardiology - from there records admission in July 2020 ith chest pain. Echo and nuclear stress were benign, I see the echo report in Care everywhere but not the stress test -symptoms thought possibly due to dysphagia,  2014 cath at [facility] was benign - just started an antacid for possible reflux  - several months of DOE. DOE with sweeping at home, using her push mower.  - mild LE edema at times. Some orthopnea recently - occasional wheezing, no cough. Constantly clearing throat.  - former smoker <10 years, husband was former smoker    2. Preoperative evaluation - being considered for hand ortho surgery for trigger finger    Past Medical History:  Diagnosis Date  . Diabetes mellitus without complication ([place])   . Gout   . Hypertension      Allergies  Allergen Reactions  . Gabapentin Other (See Comments)    syncope  . Capoten [Captopril] Cough  . Celexa [Citalopram Hydrobromide]     Passed out  . Lexapro [Escitalopram Oxalate]     "see psychodelic lights"  . Lipitor [Atorvastatin Calcium] Other (See Comments)    Achy legs  . Tramadol     confusion     Current Outpatient Medications  Medication Sig Dispense Refill  . allopurinol (ZYLOPRIM) 300 MG tablet Take 300 mg by mouth daily.    . cloNIDine (CATAPRES) 0.3 MG tablet Take 1 tablet by mouth 2 (two) times daily.    Marland Kitchen glipiZIDE (GLUCOTROL XL) 5 MG 24 hr tablet Take 5 mg by mouth daily with breakfast.    . metFORMIN (GLUCOPHAGE-XR) 500 MG 24 hr tablet Take 500 mg by mouth daily.    . metoprolol tartrate (LOPRESSOR) 50 MG tablet Take 50 mg by mouth 2 (two) times daily.    . rosuvastatin (CRESTOR) 5 MG tablet Take 5 mg by mouth daily.    Marland Kitchen spironolactone (ALDACTONE) 25 MG tablet Take 25 mg by mouth daily.    . valsartan-hydrochlorothiazide (DIOVAN-HCT) 160-12.5 MG tablet Take 1 tablet by mouth  daily.     No current facility-administered medications for this visit.     Past Surgical History:  Procedure Laterality Date  . ABDOMINAL HYSTERECTOMY    . CARPAL TUNNEL RELEASE Left   . CATARACT EXTRACTION W/PHACO Right 09/13/2019   Procedure: CATARACT EXTRACTION PHACO AND INTRAOCULAR LENS PLACEMENT RIGHT EYE;  Surgeon: Baruch Goldmann, MD;  Location: [facility];  Service: Ophthalmology;  Laterality: Right;  right  . CATARACT EXTRACTION W/PHACO Left 09/27/2019   Procedure: CATARACT EXTRACTION PHACO AND INTRAOCULAR LENS PLACEMENT LEFT EYE  (CDE: 8.04);  Surgeon: Baruch Goldmann, MD;  Location: [facility];  Service: Ophthalmology;  Laterality: Left;  . TUBAL LIGATION       Allergies  Allergen Reactions  . Gabapentin Other (See Comments)    syncope  . Capoten [Captopril] Cough  . Celexa [Citalopram Hydrobromide]     Passed out  . Lexapro [Escitalopram Oxalate]     "see psychodelic lights"  . Lipitor [Atorvastatin Calcium] Other (See Comments)    Achy legs  . Tramadol     confusion      Family History  Problem Relation Age of Onset  . Hyperlipidemia Mother   . Parkinson's disease Father   . Hyperlipidemia Sister   . Alcoholism Brother   . Diabetes Brother      Social  History Debra Vasquez reports that she quit smoking about 34 years ago. Her smoking use included cigarettes. She has a 3.00 pack-year smoking history. She has never used smokeless tobacco. Debra Vasquez reports previous alcohol use.   Review of Systems CONSTITUTIONAL: No weight loss, fever, chills, weakness or fatigue.  HEENT: Eyes: No visual loss, blurred vision, double vision or yellow sclerae.No hearing loss, sneezing, congestion, runny nose or sore throat.  SKIN: No rash or itching.  CARDIOVASCULAR: per hpi RESPIRATORY: No shortness of breath, cough or sputum.  GASTROINTESTINAL: No anorexia, nausea, vomiting or diarrhea. No abdominal pain or blood.  GENITOURINARY: No burning on urination, no  polyuria NEUROLOGICAL: No headache, dizziness, syncope, paralysis, ataxia, numbness or tingling in the extremities. No change in bowel or bladder control.  MUSCULOSKELETAL: No muscle, back pain, joint pain or stiffness.  LYMPHATICS: No enlarged nodes. No history of splenectomy.  PSYCHIATRIC: No history of depression or anxiety.  ENDOCRINOLOGIC: No reports of sweating, cold or heat intolerance. No polyuria or polydipsia.  Marland Kitchen   Physical Examination Today's Vitals   07/09/20 0837  BP: (!) 142/82  Pulse: 60  SpO2: 98%  Weight: 175 lb 9.6 oz (79.7 kg)  Height: 5\' 5"  (1.651 m)   Body mass index is 29.22 kg/m.  Gen: resting comfortably, no acute distress HEENT: no scleral icterus, pupils equal round and reactive, no palptable cervical adenopathy,  CV: RRR, no m/r/g, no jvd Resp: Clear to auscultation bilaterally GI: abdomen is soft, non-tender, non-distended, normal bowel sounds, no hepatosplenomegaly MSK: extremities are warm, no edema.  Skin: warm, no rash Neuro:  no focal deficits Psych: appropriate affect   Diagnostic Studies  Exam Date:   06/04/2020 9:40 AM  Site Location:   [place]  Exam Location:   [place]  Admit Date:   06/04/2020    Exam Type:   ECHOCARDIOGRAM W COLORFLOW SPECTRAL DOPPLER    Study Info  Indications    - r06.02, r60.0    Complete two-dimensional, color flow and Doppler transthoracic echocardiogram  is performed.    Staff  Referring Physician:   Dionisio David ;  Sonographer:   [place]  Ordering Physician:   Dionisio David    Account #:   [id number]      Summary  1. The left ventricle is normal in size with upper normal wall thickness.  2. The left ventricular systolic function is normal, LVEF is visually  estimated at 60-65%.  3. There is grade II diastolic dysfunction (elevated filling pressure).  4. The mitral valve leaflets are mildly thickened with normal  leaflet  mobility.  5. The aortic valve is trileaflet with mildly thickened leaflets with normal  excursion.  6. The left atrium is moderately dilated in size.  7. The right ventricle is mildly dilated in size, with normal systolic  function.  8. The right atrium is mildly dilated in size.      Left Ventricle  The left ventricle is normal in size with upper normal wall thickness.  The left ventricular systolic function is normal, LVEF is visually estimated  at 60-65%.  There is grade II diastolic dysfunction (elevated filling pressure).    Right Ventricle  The right ventricle is mildly dilated in size, with normal systolic  function.      Left Atrium  The left atrium is moderately dilated in size.    Right Atrium  The right atrium is mildly dilated in size.      Aortic Valve  The aortic  valve is trileaflet with mildly thickened leaflets with normal  excursion.  There is no significant aortic regurgitation.  There is no evidence of a significant transvalvular gradient.  Aortic valve sclerosis without stenosis.    Pulmonic Valve  The pulmonic valve is poorly visualized, but probably normal.  There is trivial pulmonic regurgitation.  There is no evidence of a significant transvalvular gradient.    Mitral Valve  The mitral valve leaflets are mildly thickened with normal leaflet mobility.  There is trivial mitral valve regurgitation.    Tricuspid Valve  The tricuspid valve leaflets are normal, with normal leaflet mobility.  There is no significant tricuspid regurgitation.  Pulmonary systolic pressure cannot be estimated due to insufficient TR jet.      Other Findings  Rhythm: Sinus Rhythm.    Pericardium/Pleural  There is no pericardial effusion.    Inferior Vena Cava  The IVC is not well visualized precluding the ability to accurate assess  right atrial pressure.    Aorta   The aorta is normal in size in the visualized segments.     06/2019 echo  Normal left ventricular systolic function, ejection fraction 60 to 65%   Degenerative mitral valve disease   Aortic sclerosis without stenosis   Normal right ventricular systolic function   08/8118 cath CORONARY ANGIOGRAPHY:   Vessel Segment Vessel Type Stenosis (%) Description Comment   LMCA      Native    0                    Ramus Present:    No  Coronary Dominance: Right   Lesions/Grafts Comments: Widely patent, normal coronaries.        CONCLUSIONS:      OTHER:   Widely patent normal coronaries.  Normal left ventricular function.  rt radial       Assessment and Plan   1. Chest pain/SOB - long history of symptoms - cath 2014 benign. From [facility] notes stress and echo benign one year ago, we see the echo but are calling to get the stress report. More recent echo with just diastolic dysfunction. She does report some orthopnea at times - d/c valsartan/hctz combo, restart valsartan 320mg  and a more potent diuretic chlorthalidone 12.5mg  daily - unclear how much diastolic dysfunction may be playing a role in her SOB, if ongoing would consider PFTs. Fairly benign cardiac workup over the year - pcp trying antacids for her chest pains, from [facility]  Cards notes stress was negative, they though symptoms were likely GI at the time. FOllow with antacid  2. Preoperative evaluation - fairly low risk surgery. She has chronic symptoms - for completeness sake would hold on final clearance until we get the stress report but would not anticipate having to delay her surgery.   F/u 3 months     Arnoldo Lenis, M.D.

## 2020-07-13 ENCOUNTER — Telehealth: Payer: Self-pay | Admitting: Cardiology

## 2020-07-13 DIAGNOSIS — M7989 Other specified soft tissue disorders: Secondary | ICD-10-CM | POA: Diagnosis not present

## 2020-07-13 DIAGNOSIS — R21 Rash and other nonspecific skin eruption: Secondary | ICD-10-CM | POA: Diagnosis not present

## 2020-07-13 DIAGNOSIS — T502X5A Adverse effect of carbonic-anhydrase inhibitors, benzothiadiazides and other diuretics, initial encounter: Secondary | ICD-10-CM | POA: Diagnosis not present

## 2020-07-13 DIAGNOSIS — I10 Essential (primary) hypertension: Secondary | ICD-10-CM | POA: Diagnosis not present

## 2020-07-13 DIAGNOSIS — T465X5A Adverse effect of other antihypertensive drugs, initial encounter: Secondary | ICD-10-CM | POA: Diagnosis not present

## 2020-07-13 DIAGNOSIS — I509 Heart failure, unspecified: Secondary | ICD-10-CM | POA: Diagnosis not present

## 2020-07-13 DIAGNOSIS — Z79899 Other long term (current) drug therapy: Secondary | ICD-10-CM | POA: Diagnosis not present

## 2020-07-13 DIAGNOSIS — E119 Type 2 diabetes mellitus without complications: Secondary | ICD-10-CM | POA: Diagnosis not present

## 2020-07-13 DIAGNOSIS — K219 Gastro-esophageal reflux disease without esophagitis: Secondary | ICD-10-CM | POA: Diagnosis not present

## 2020-07-13 DIAGNOSIS — T50991A Poisoning by other drugs, medicaments and biological substances, accidental (unintentional), initial encounter: Secondary | ICD-10-CM | POA: Diagnosis not present

## 2020-07-13 DIAGNOSIS — R5383 Other fatigue: Secondary | ICD-10-CM | POA: Diagnosis not present

## 2020-07-13 DIAGNOSIS — Z889 Allergy status to unspecified drugs, medicaments and biological substances status: Secondary | ICD-10-CM | POA: Diagnosis not present

## 2020-07-13 NOTE — Telephone Encounter (Signed)
Unclear the cause of her symptoms. The valsartan is not new, the chlorthalidone is new but is in the same family as HCTZ that she had been on before. Its unclear if its related to the chlorthalidone, if recurrent issues let us know and we can change it.    J Rhyan Wolters MD

## 2020-07-13 NOTE — Telephone Encounter (Signed)
Pt called stating she's having swelling since she's had the medication changes

## 2020-07-13 NOTE — Telephone Encounter (Signed)
Pt c/o increased swelling in feet/legs/eyes and itching around the eyes since recent med change - says weight today is 174lbs with home scales - says SOB has remained the same as she decribed at [facility] - denies any dizziness/chest pain - says this happened around 12pm today and she feels much better now but was concerned that she may not be able to tolerate medication changes (verified she started valsartan 320 mg and chlorthalidone 12.5 mg daily) pt will continue to monitor symptoms and will forward to provider

## 2020-07-22 NOTE — Telephone Encounter (Signed)
Attempted to reach patient to give response but had to leave message on vm Review of chart shows patient seen at [facility] ED for this issue Note will be closed.

## 2020-07-23 DIAGNOSIS — Z6828 Body mass index (BMI) 28.0-28.9, adult: Secondary | ICD-10-CM | POA: Diagnosis not present

## 2020-07-23 DIAGNOSIS — R609 Edema, unspecified: Secondary | ICD-10-CM | POA: Diagnosis not present

## 2020-07-23 DIAGNOSIS — I10 Essential (primary) hypertension: Secondary | ICD-10-CM | POA: Diagnosis not present

## 2020-07-23 DIAGNOSIS — E782 Mixed hyperlipidemia: Secondary | ICD-10-CM | POA: Diagnosis not present

## 2020-07-24 DIAGNOSIS — N183 Chronic kidney disease, stage 3 unspecified: Secondary | ICD-10-CM | POA: Diagnosis not present

## 2020-07-24 DIAGNOSIS — E1122 Type 2 diabetes mellitus with diabetic chronic kidney disease: Secondary | ICD-10-CM | POA: Diagnosis not present

## 2020-07-24 DIAGNOSIS — E7849 Other hyperlipidemia: Secondary | ICD-10-CM | POA: Diagnosis not present

## 2020-07-24 DIAGNOSIS — I129 Hypertensive chronic kidney disease with stage 1 through stage 4 chronic kidney disease, or unspecified chronic kidney disease: Secondary | ICD-10-CM | POA: Diagnosis not present

## 2020-07-29 ENCOUNTER — Telehealth: Payer: Self-pay | Admitting: Cardiology

## 2020-07-29 NOTE — Telephone Encounter (Signed)
Stress test scanned into Epic

## 2020-07-29 NOTE — Telephone Encounter (Signed)
Surgical clearance was deferred until stress test report from [facility] from 2020 was reviewed - this was requested July 15th 2021 - spoke with Dawn at [facility] and will fax over stress test report

## 2020-07-29 NOTE — Telephone Encounter (Signed)
Pt called to check on surgical clearance   Please give pt a cll [phone]

## 2020-07-30 DIAGNOSIS — I10 Essential (primary) hypertension: Secondary | ICD-10-CM | POA: Diagnosis not present

## 2020-07-30 DIAGNOSIS — E1122 Type 2 diabetes mellitus with diabetic chronic kidney disease: Secondary | ICD-10-CM | POA: Diagnosis not present

## 2020-07-30 DIAGNOSIS — N183 Chronic kidney disease, stage 3 unspecified: Secondary | ICD-10-CM | POA: Diagnosis not present

## 2020-08-03 NOTE — Telephone Encounter (Signed)
  We only have the exercise portion, not the nuclaer portion results. Can we obtain those  J Avigail Pilling MD

## 2020-08-04 NOTE — Telephone Encounter (Signed)
Requested.

## 2020-08-04 NOTE — Telephone Encounter (Signed)
Received 2nd request from [facility] for request for trigger finger release. Pt saw Dr. Harl Bowie 7/12.  Will route back to preop to address.

## 2020-08-05 NOTE — Telephone Encounter (Signed)
Waiting on stress test results from [facility], requested 08/03/2020.  Clearance pending these results.  Kerin Ransom PA-C 08/05/2020 8:21 AM

## 2020-08-06 NOTE — Telephone Encounter (Addendum)
   Primary Cardiologist: Dr Harl Bowie  Chart reviewed as part of pre-operative protocol coverage. Stress test from [facility] reviewed-negative. Given past medical history and time since last visit, based on ACC/AHA guidelines, [place] would be at acceptable risk for the planned procedure without further cardiovascular testing.   I will route this recommendation to the requesting party via Epic fax function and remove from pre-op pool.  Please call with questions.  Kerin Ransom, PA-C 08/06/2020, 8:08 AM

## 2020-08-07 NOTE — Telephone Encounter (Signed)
Cleared by Kerin Ransom, PA-C on 08/06/20, left message for patient to call back.

## 2020-08-07 NOTE — Telephone Encounter (Signed)
Patient called wanting to check on the status of her clearance for surgery.

## 2020-08-10 NOTE — Telephone Encounter (Signed)
I spoke with patient.She was notified that Kerin Ransom, PA-C had cleared her for surgery on 08/06/20

## 2020-08-14 DIAGNOSIS — I10 Essential (primary) hypertension: Secondary | ICD-10-CM | POA: Diagnosis not present

## 2020-08-14 DIAGNOSIS — N183 Chronic kidney disease, stage 3 unspecified: Secondary | ICD-10-CM | POA: Diagnosis not present

## 2020-08-24 DIAGNOSIS — N183 Chronic kidney disease, stage 3 unspecified: Secondary | ICD-10-CM | POA: Diagnosis not present

## 2020-08-25 DIAGNOSIS — N183 Chronic kidney disease, stage 3 unspecified: Secondary | ICD-10-CM | POA: Diagnosis not present

## 2020-08-25 DIAGNOSIS — E1122 Type 2 diabetes mellitus with diabetic chronic kidney disease: Secondary | ICD-10-CM | POA: Diagnosis not present

## 2020-08-25 DIAGNOSIS — I129 Hypertensive chronic kidney disease with stage 1 through stage 4 chronic kidney disease, or unspecified chronic kidney disease: Secondary | ICD-10-CM | POA: Diagnosis not present

## 2020-08-25 DIAGNOSIS — M1000 Idiopathic gout, unspecified site: Secondary | ICD-10-CM | POA: Diagnosis not present

## 2020-08-25 DIAGNOSIS — E7849 Other hyperlipidemia: Secondary | ICD-10-CM | POA: Diagnosis not present

## 2020-09-09 DIAGNOSIS — M65331 Trigger finger, right middle finger: Secondary | ICD-10-CM | POA: Diagnosis not present

## 2020-09-15 DIAGNOSIS — M65332 Trigger finger, left middle finger: Secondary | ICD-10-CM | POA: Diagnosis not present

## 2020-09-15 DIAGNOSIS — M65331 Trigger finger, right middle finger: Secondary | ICD-10-CM | POA: Diagnosis not present

## 2020-09-22 DIAGNOSIS — M65332 Trigger finger, left middle finger: Secondary | ICD-10-CM | POA: Diagnosis not present

## 2020-09-22 DIAGNOSIS — M65331 Trigger finger, right middle finger: Secondary | ICD-10-CM | POA: Diagnosis not present

## 2020-09-23 DIAGNOSIS — Z6828 Body mass index (BMI) 28.0-28.9, adult: Secondary | ICD-10-CM | POA: Diagnosis not present

## 2020-09-23 DIAGNOSIS — Z23 Encounter for immunization: Secondary | ICD-10-CM | POA: Diagnosis not present

## 2020-09-23 DIAGNOSIS — R300 Dysuria: Secondary | ICD-10-CM | POA: Diagnosis not present

## 2020-09-23 DIAGNOSIS — I5032 Chronic diastolic (congestive) heart failure: Secondary | ICD-10-CM | POA: Diagnosis not present

## 2020-09-28 DIAGNOSIS — Z6827 Body mass index (BMI) 27.0-27.9, adult: Secondary | ICD-10-CM | POA: Diagnosis not present

## 2020-09-28 DIAGNOSIS — R946 Abnormal results of thyroid function studies: Secondary | ICD-10-CM | POA: Diagnosis not present

## 2020-09-28 DIAGNOSIS — R5382 Chronic fatigue, unspecified: Secondary | ICD-10-CM | POA: Diagnosis not present

## 2020-09-28 DIAGNOSIS — I5032 Chronic diastolic (congestive) heart failure: Secondary | ICD-10-CM | POA: Diagnosis not present

## 2020-10-07 DIAGNOSIS — I10 Essential (primary) hypertension: Secondary | ICD-10-CM | POA: Diagnosis not present

## 2020-10-07 DIAGNOSIS — E1122 Type 2 diabetes mellitus with diabetic chronic kidney disease: Secondary | ICD-10-CM | POA: Diagnosis not present

## 2020-10-07 DIAGNOSIS — N183 Chronic kidney disease, stage 3 unspecified: Secondary | ICD-10-CM | POA: Diagnosis not present

## 2020-10-13 DIAGNOSIS — M65331 Trigger finger, right middle finger: Secondary | ICD-10-CM | POA: Diagnosis not present

## 2020-10-14 ENCOUNTER — Ambulatory Visit: Payer: Medicare Other | Admitting: Cardiology

## 2020-10-14 DIAGNOSIS — E1122 Type 2 diabetes mellitus with diabetic chronic kidney disease: Secondary | ICD-10-CM | POA: Diagnosis not present

## 2020-10-14 DIAGNOSIS — N183 Chronic kidney disease, stage 3 unspecified: Secondary | ICD-10-CM | POA: Diagnosis not present

## 2020-10-16 DIAGNOSIS — Z885 Allergy status to narcotic agent status: Secondary | ICD-10-CM | POA: Diagnosis not present

## 2020-10-16 DIAGNOSIS — Z87891 Personal history of nicotine dependence: Secondary | ICD-10-CM | POA: Diagnosis not present

## 2020-10-16 DIAGNOSIS — R9431 Abnormal electrocardiogram [ECG] [EKG]: Secondary | ICD-10-CM | POA: Diagnosis not present

## 2020-10-16 DIAGNOSIS — R21 Rash and other nonspecific skin eruption: Secondary | ICD-10-CM | POA: Diagnosis not present

## 2020-10-16 DIAGNOSIS — I5032 Chronic diastolic (congestive) heart failure: Secondary | ICD-10-CM | POA: Diagnosis not present

## 2020-10-16 DIAGNOSIS — E785 Hyperlipidemia, unspecified: Secondary | ICD-10-CM | POA: Diagnosis not present

## 2020-10-16 DIAGNOSIS — Z886 Allergy status to analgesic agent status: Secondary | ICD-10-CM | POA: Diagnosis not present

## 2020-10-16 DIAGNOSIS — E119 Type 2 diabetes mellitus without complications: Secondary | ICD-10-CM | POA: Diagnosis not present

## 2020-10-16 DIAGNOSIS — K859 Acute pancreatitis without necrosis or infection, unspecified: Secondary | ICD-10-CM | POA: Diagnosis not present

## 2020-10-16 DIAGNOSIS — K219 Gastro-esophageal reflux disease without esophagitis: Secondary | ICD-10-CM | POA: Diagnosis not present

## 2020-10-16 DIAGNOSIS — E7800 Pure hypercholesterolemia, unspecified: Secondary | ICD-10-CM | POA: Diagnosis not present

## 2020-10-16 DIAGNOSIS — Z79899 Other long term (current) drug therapy: Secondary | ICD-10-CM | POA: Diagnosis not present

## 2020-10-16 DIAGNOSIS — R079 Chest pain, unspecified: Secondary | ICD-10-CM | POA: Diagnosis not present

## 2020-10-16 DIAGNOSIS — I110 Hypertensive heart disease with heart failure: Secondary | ICD-10-CM | POA: Diagnosis not present

## 2020-10-19 DIAGNOSIS — K859 Acute pancreatitis without necrosis or infection, unspecified: Secondary | ICD-10-CM | POA: Diagnosis not present

## 2020-10-19 DIAGNOSIS — Z6827 Body mass index (BMI) 27.0-27.9, adult: Secondary | ICD-10-CM | POA: Diagnosis not present

## 2020-10-19 DIAGNOSIS — I10 Essential (primary) hypertension: Secondary | ICD-10-CM | POA: Diagnosis not present

## 2020-10-24 DIAGNOSIS — N183 Chronic kidney disease, stage 3 unspecified: Secondary | ICD-10-CM | POA: Diagnosis not present

## 2020-10-24 DIAGNOSIS — E1122 Type 2 diabetes mellitus with diabetic chronic kidney disease: Secondary | ICD-10-CM | POA: Diagnosis not present

## 2020-10-24 DIAGNOSIS — E7849 Other hyperlipidemia: Secondary | ICD-10-CM | POA: Diagnosis not present

## 2020-10-24 DIAGNOSIS — I129 Hypertensive chronic kidney disease with stage 1 through stage 4 chronic kidney disease, or unspecified chronic kidney disease: Secondary | ICD-10-CM | POA: Diagnosis not present

## 2020-10-27 DIAGNOSIS — N183 Chronic kidney disease, stage 3 unspecified: Secondary | ICD-10-CM | POA: Diagnosis not present

## 2020-10-27 DIAGNOSIS — E1122 Type 2 diabetes mellitus with diabetic chronic kidney disease: Secondary | ICD-10-CM | POA: Diagnosis not present

## 2020-10-29 ENCOUNTER — Encounter: Payer: Self-pay | Admitting: Internal Medicine

## 2020-11-09 DIAGNOSIS — R9431 Abnormal electrocardiogram [ECG] [EKG]: Secondary | ICD-10-CM | POA: Diagnosis not present

## 2020-11-09 DIAGNOSIS — R059 Cough, unspecified: Secondary | ICD-10-CM | POA: Diagnosis not present

## 2020-11-09 DIAGNOSIS — I110 Hypertensive heart disease with heart failure: Secondary | ICD-10-CM | POA: Diagnosis not present

## 2020-11-09 DIAGNOSIS — R079 Chest pain, unspecified: Secondary | ICD-10-CM | POA: Diagnosis not present

## 2020-11-09 DIAGNOSIS — K219 Gastro-esophageal reflux disease without esophagitis: Secondary | ICD-10-CM | POA: Diagnosis not present

## 2020-11-09 DIAGNOSIS — F458 Other somatoform disorders: Secondary | ICD-10-CM | POA: Diagnosis not present

## 2020-11-09 DIAGNOSIS — Z79899 Other long term (current) drug therapy: Secondary | ICD-10-CM | POA: Diagnosis not present

## 2020-11-09 DIAGNOSIS — I509 Heart failure, unspecified: Secondary | ICD-10-CM | POA: Diagnosis not present

## 2020-11-09 DIAGNOSIS — Z9851 Tubal ligation status: Secondary | ICD-10-CM | POA: Diagnosis not present

## 2020-11-09 DIAGNOSIS — E119 Type 2 diabetes mellitus without complications: Secondary | ICD-10-CM | POA: Diagnosis not present

## 2020-11-09 DIAGNOSIS — Z7984 Long term (current) use of oral hypoglycemic drugs: Secondary | ICD-10-CM | POA: Diagnosis not present

## 2020-11-09 DIAGNOSIS — R0989 Other specified symptoms and signs involving the circulatory and respiratory systems: Secondary | ICD-10-CM | POA: Diagnosis not present

## 2020-11-09 DIAGNOSIS — Z886 Allergy status to analgesic agent status: Secondary | ICD-10-CM | POA: Diagnosis not present

## 2020-11-09 DIAGNOSIS — Z7982 Long term (current) use of aspirin: Secondary | ICD-10-CM | POA: Diagnosis not present

## 2020-11-09 DIAGNOSIS — E7800 Pure hypercholesterolemia, unspecified: Secondary | ICD-10-CM | POA: Diagnosis not present

## 2020-11-11 ENCOUNTER — Other Ambulatory Visit: Payer: Self-pay

## 2020-11-11 ENCOUNTER — Encounter: Payer: Self-pay | Admitting: *Deleted

## 2020-11-11 ENCOUNTER — Ambulatory Visit (INDEPENDENT_AMBULATORY_CARE_PROVIDER_SITE_OTHER): Payer: Medicare Other | Admitting: Nurse Practitioner

## 2020-11-11 ENCOUNTER — Encounter: Payer: Self-pay | Admitting: Nurse Practitioner

## 2020-11-11 DIAGNOSIS — K219 Gastro-esophageal reflux disease without esophagitis: Secondary | ICD-10-CM | POA: Diagnosis not present

## 2020-11-11 DIAGNOSIS — R1319 Other dysphagia: Secondary | ICD-10-CM | POA: Diagnosis not present

## 2020-11-11 DIAGNOSIS — R1310 Dysphagia, unspecified: Secondary | ICD-10-CM | POA: Insufficient documentation

## 2020-11-11 MED ORDER — PANTOPRAZOLE SODIUM 40 MG PO TBEC: 40.0000 mg | DELAYED_RELEASE_TABLET | Freq: Two times a day (BID) | ORAL | 3 refills | Status: DC

## 2020-11-11 NOTE — H&P (View-Only) (Signed)
Primary Care Physician:  Caryl Bis, MD Primary Gastroenterologist:  Dr. Gala Romney  Chief Complaint  Patient presents with  . Dysphagia    trouble food going down and gets strangled. reports going on for years and has not had EGD done prior, feels like "lump" is in throat at times    HPI:   Debra Vasquez is a 74 y.o. female who presents on referral from primary [place] emergency department for GERD and globus sensation/dysphagia likely needing EGD.  She was seen in ED 11/07/2020 and has a known history of CHF, GERD, hypertension, hypercholesterolemia, type 2 diabetes.  She is complaining about fullness sensation in the throat present for many months that is getting worse and sometimes with difficulty swallowing.  She has previously been told is likely due to her GERD.  It is becoming limiting to her quality of life.  Cardiac evaluation with negative troponins.  BMP very mildly elevated at 143.  EKG with normal sinus rhythm and nonspecific ST abnormality.  No active cardiopulmonary disease on chest x-ray.  Today states doing okay overall. She has had GERD and dysphagia "for years" and never had an EGD. Currently on Protonix 40 mg daily. She has even had issues with chewed up banana. Has regurgitation at time. Dysphagia occurs about every day. GERD symptoms doesn't help much at all, daily symptoms include belching. No esophageal burning. Denies N/V, hematochezia, melena, fever, chills, unintentional weight loss. Denies URI or flu-like symptoms. Denies loss of sense of taste or smell. The patient has not received COVID-19 vaccination(s). They are not interested in vaccine scheduling information. Denies chest pain, dyspnea, dizziness, lightheadedness, syncope, near syncope. Denies any other upper or lower GI symptoms.  Had a colonoscopy "years ago" with Dr. Ladona Horns (at [facility]) and was told no need for another colonoscopy.  States last hgb a1c was 7.0 or less. She has CHF but EF  on echo 06/04/20 was 60-65%  Past Medical History:  Diagnosis Date  . Diabetes mellitus without complication ([place])   . Dysphagia   . GERD (gastroesophageal reflux disease)   . Gout   . Hypertension     Past Surgical History:  Procedure Laterality Date  . ABDOMINAL HYSTERECTOMY    . CARPAL TUNNEL RELEASE Left   . CATARACT EXTRACTION W/PHACO Right 09/13/2019   Procedure: CATARACT EXTRACTION PHACO AND INTRAOCULAR LENS PLACEMENT RIGHT EYE;  Surgeon: Baruch Goldmann, MD;  Location: [facility];  Service: Ophthalmology;  Laterality: Right;  right  . CATARACT EXTRACTION W/PHACO Left 09/27/2019   Procedure: CATARACT EXTRACTION PHACO AND INTRAOCULAR LENS PLACEMENT LEFT EYE  (CDE: 8.04);  Surgeon: Baruch Goldmann, MD;  Location: [facility];  Service: Ophthalmology;  Laterality: Left;  . TUBAL LIGATION      Current Outpatient Medications  Medication Sig Dispense Refill  . allopurinol (ZYLOPRIM) 300 MG tablet Take 300 mg by mouth in the morning.     Marland Kitchen aspirin EC 81 MG tablet Take 81 mg by mouth daily. Swallow whole.    . Calcium Carbonate-Vitamin D (CALCARB 600/D PO) Take by mouth daily.    . cloNIDine (CATAPRES) 0.3 MG tablet Take 1 tablet by mouth 2 (two) times daily.    . furosemide (LASIX) 20 MG tablet Take 20 mg by mouth as needed.    Marland Kitchen glipiZIDE (GLUCOTROL XL) 5 MG 24 hr tablet Take 5 mg by mouth in the morning.     . magnesium oxide (MAG-OX) 400 MG tablet Take 400 mg by mouth daily.    Marland Kitchen  metFORMIN (GLUCOPHAGE-XR) 500 MG 24 hr tablet Take 500 mg by mouth at bedtime.     . metoprolol tartrate (LOPRESSOR) 50 MG tablet Take 50 mg by mouth 2 (two) times daily.    . pantoprazole (PROTONIX) 40 MG tablet Take 40 mg by mouth in the morning.     . rosuvastatin (CRESTOR) 5 MG tablet Take 5 mg by mouth daily.    . valsartan-hydrochlorothiazide (DIOVAN-HCT) 320-12.5 MG tablet Take 1 tablet by mouth daily.    . pantoprazole (PROTONIX) 40 MG tablet Take 1 tablet (40 mg total) by mouth 2 (two) times daily before a  meal. 60 tablet 3   No current facility-administered medications for this visit.    Allergies as of 11/11/2020 - Review Complete 11/11/2020  Allergen Reaction Noted  . Gabapentin Other (See Comments) 12/17/2018  . Capoten [captopril] Cough 09/10/2019  . Celexa [citalopram hydrobromide]  09/10/2019  . Hydrocodone  11/11/2020  . Lexapro [escitalopram oxalate]  09/10/2019  . Lipitor [atorvastatin calcium] Other (See Comments) 09/13/2019  . Tramadol  09/10/2019    Family History  Problem Relation Age of Onset  . Hyperlipidemia Mother   . Parkinson's disease Father   . Hyperlipidemia Sister   . Alcoholism Brother   . Diabetes Brother   . Colon cancer Neg Hx   . Gastric cancer Neg Hx   . Esophageal cancer Neg Hx     Social History   Socioeconomic History  . Marital status: Married    Spouse name: Not on file  . Number of children: Not on file  . Years of education: Not on file  . Highest education level: Not on file  Occupational History  . Not on file  Tobacco Use  . Smoking status: Former Smoker    Packs/day: 0.50    Years: 6.00    Pack years: 3.00    Types: Cigarettes    Quit date: 09/09/1985    Years since quitting: 35.1  . Smokeless tobacco: Never Used  Vaping Use  . Vaping Use: Never used  Substance and Sexual Activity  . Alcohol use: Not Currently  . Drug use: Never  . Sexual activity: Not Currently  Other Topics Concern  . Not on file  Social History Narrative  . Not on file   Social Determinants of Health   Financial Resource Strain:   . Difficulty of Paying Living Expenses: Not on file  Food Insecurity:   . Worried About Charity fundraiser in the Last Year: Not on file  . Ran Out of Food in the Last Year: Not on file  Transportation Needs:   . Lack of Transportation (Medical): Not on file  . Lack of Transportation (Non-Medical): Not on file  Physical Activity:   . Days of Exercise per Week: Not on file  . Minutes of Exercise per Session: Not  on file  Stress:   . Feeling of Stress : Not on file  Social Connections:   . Frequency of Communication with Friends and Family: Not on file  . Frequency of Social Gatherings with Friends and Family: Not on file  . Attends Religious Services: Not on file  . Active Member of Clubs or Organizations: Not on file  . Attends Archivist Meetings: Not on file  . Marital Status: Not on file  Intimate Partner Violence:   . Fear of Current or Ex-Partner: Not on file  . Emotionally Abused: Not on file  . Physically Abused: Not on file  . Sexually  Abused: Not on file    Subjective: Review of Systems  Constitutional: Negative for chills, fever, malaise/fatigue and weight loss.  HENT: Negative for congestion and sore throat.   Respiratory: Negative for cough and shortness of breath.   Cardiovascular: Negative for chest pain and palpitations.  Gastrointestinal: Positive for heartburn. Negative for abdominal pain, blood in stool, constipation, diarrhea, melena, nausea and vomiting.       Dysphagia  Musculoskeletal: Negative for joint pain and myalgias.  Skin: Negative for rash.  Neurological: Negative for dizziness and weakness.  Endo/Heme/Allergies: Does not bruise/bleed easily.  Psychiatric/Behavioral: Negative for depression. The patient is not nervous/anxious.   All other systems reviewed and are negative.      Objective: BP 121/69   Pulse (!) 57   Temp (!) 97.1 F (36.2 C)   Ht 5\' 5"  (1.651 m)   Wt 167 lb 12.8 oz (76.1 kg)   BMI 27.92 kg/m  Physical Exam Vitals and nursing note reviewed.  Constitutional:      General: She is not in acute distress.    Appearance: Normal appearance. She is well-developed and normal weight. She is not ill-appearing, toxic-appearing or diaphoretic.  HENT:     Head: Normocephalic and atraumatic.     Nose: No congestion or rhinorrhea.  Eyes:     General: No scleral icterus. Cardiovascular:     Rate and Rhythm: Normal rate and regular  rhythm.     Heart sounds: Normal heart sounds.  Pulmonary:     Effort: Pulmonary effort is normal. No respiratory distress.     Breath sounds: Normal breath sounds.  Abdominal:     General: Bowel sounds are normal.     Palpations: Abdomen is soft. There is no hepatomegaly, splenomegaly or mass.     Tenderness: There is no abdominal tenderness. There is no guarding or rebound.     Hernia: No hernia is present.  Skin:    General: Skin is warm and dry.     Coloration: Skin is not jaundiced.     Findings: No rash.  Neurological:     General: No focal deficit present.     Mental Status: She is alert and oriented to person, place, and time.  Psychiatric:        Attention and Perception: Attention normal.        Mood and Affect: Mood normal.        Speech: Speech normal.        Behavior: Behavior normal.        Thought Content: Thought content normal.        Cognition and Memory: Cognition and memory normal.      Assessment:  74 year old female who presents for chronic GERD and dysphagia.  Is been ongoing for couple years.  She states she is asked repeatedly for an EGD but no one has completed anything.  She was seen in the ED for worsening and progressive symptoms and they recommended GI evaluation.  She is not sure when her colonoscopy was exactly, but it was completed at [facility].  We will request results.  GERD: I am not sure she is having true GERD symptoms.  She seems to, defer back to her swallowing difficulties.  Denies esophageal burning.  However, she is on Protonix 40 mg daily and I will increase this to twice a day to see if it results in any improvement  Solid food dysphagia: We will plan for an upper endoscopy with possible dilation.  Differentials include  esophagitis, longstanding GERD, benign stricture, cannot rule out web, ring, malignant process.  Further recommendations will follow.  We will give her dysphagia three diet recommendations in the interim.  If  persistent symptoms despite EGD and dilation we can schedule barium pill esophagram possible speech therapy evaluation   Proceed with EGD with Dr. Gala Romney in near future: the risks, benefits, and alternatives have been discussed with the patient in detail. The patient states understanding and desires to proceed.  Patient is currently on Metformin and glipizide. The patient is not on any other anticoagulants, anxiolytics, chronic pain medications, antidepressants, antidiabetics, or iron supplements.  We will make appropriate adjustments to her diabetic medications prior to procedure.   Plan: 1. Request previous colonoscopy from [facility] 2. Increased x40 mg twice daily 3. EGD with possible dilation 4. Dysphagia three diet in the interim 5. Follow-up in 3 months 6. Call us for any worsening or severe symptoms    Thank you for allowing [place] to participate in the care of [place], DNP, AGNP-C Adult & Gerontological Nurse Practitioner [facility]   11/11/2020 2:09 PM   Disclaimer: This note was dictated with voice recognition software. Similar sounding words can inadvertently be transcribed and may not be corrected upon review.

## 2020-11-11 NOTE — Progress Notes (Signed)
Primary Care Physician:  Caryl Bis, MD Primary Gastroenterologist:  Dr. Gala Romney  Chief Complaint  Patient presents with  . Dysphagia    trouble food going down and gets strangled. reports going on for years and has not had EGD done prior, feels like "lump" is in throat at times    HPI:   Debra Vasquez is a 74 y.o. female who presents on referral from primary [place] emergency department for GERD and globus sensation/dysphagia likely needing EGD.  She was seen in ED 11/07/2020 and has a known history of CHF, GERD, hypertension, hypercholesterolemia, type 2 diabetes.  She is complaining about fullness sensation in the throat present for many months that is getting worse and sometimes with difficulty swallowing.  She has previously been told is likely due to her GERD.  It is becoming limiting to her quality of life.  Cardiac evaluation with negative troponins.  BMP very mildly elevated at 143.  EKG with normal sinus rhythm and nonspecific ST abnormality.  No active cardiopulmonary disease on chest x-ray.  Today states doing okay overall. She has had GERD and dysphagia "for years" and never had an EGD. Currently on Protonix 40 mg daily. She has even had issues with chewed up banana. Has regurgitation at time. Dysphagia occurs about every day. GERD symptoms doesn't help much at all, daily symptoms include belching. No esophageal burning. Denies N/V, hematochezia, melena, fever, chills, unintentional weight loss. Denies URI or flu-like symptoms. Denies loss of sense of taste or smell. The patient has not received COVID-19 vaccination(s). They are not interested in vaccine scheduling information. Denies chest pain, dyspnea, dizziness, lightheadedness, syncope, near syncope. Denies any other upper or lower GI symptoms.  Had a colonoscopy "years ago" with Dr. Ladona Horns (at [facility]) and was told no need for another colonoscopy.  States last hgb a1c was 7.0 or less. She has CHF but EF  on echo 06/04/20 was 60-65%  Past Medical History:  Diagnosis Date  . Diabetes mellitus without complication ([place])   . Dysphagia   . GERD (gastroesophageal reflux disease)   . Gout   . Hypertension     Past Surgical History:  Procedure Laterality Date  . ABDOMINAL HYSTERECTOMY    . CARPAL TUNNEL RELEASE Left   . CATARACT EXTRACTION W/PHACO Right 09/13/2019   Procedure: CATARACT EXTRACTION PHACO AND INTRAOCULAR LENS PLACEMENT RIGHT EYE;  Surgeon: Baruch Goldmann, MD;  Location: [facility];  Service: Ophthalmology;  Laterality: Right;  right  . CATARACT EXTRACTION W/PHACO Left 09/27/2019   Procedure: CATARACT EXTRACTION PHACO AND INTRAOCULAR LENS PLACEMENT LEFT EYE  (CDE: 8.04);  Surgeon: Baruch Goldmann, MD;  Location: [facility];  Service: Ophthalmology;  Laterality: Left;  . TUBAL LIGATION      Current Outpatient Medications  Medication Sig Dispense Refill  . allopurinol (ZYLOPRIM) 300 MG tablet Take 300 mg by mouth in the morning.     Marland Kitchen aspirin EC 81 MG tablet Take 81 mg by mouth daily. Swallow whole.    . Calcium Carbonate-Vitamin D (CALCARB 600/D PO) Take by mouth daily.    . cloNIDine (CATAPRES) 0.3 MG tablet Take 1 tablet by mouth 2 (two) times daily.    . furosemide (LASIX) 20 MG tablet Take 20 mg by mouth as needed.    Marland Kitchen glipiZIDE (GLUCOTROL XL) 5 MG 24 hr tablet Take 5 mg by mouth in the morning.     . magnesium oxide (MAG-OX) 400 MG tablet Take 400 mg by mouth daily.    Marland Kitchen  metFORMIN (GLUCOPHAGE-XR) 500 MG 24 hr tablet Take 500 mg by mouth at bedtime.     . metoprolol tartrate (LOPRESSOR) 50 MG tablet Take 50 mg by mouth 2 (two) times daily.    . pantoprazole (PROTONIX) 40 MG tablet Take 40 mg by mouth in the morning.     . rosuvastatin (CRESTOR) 5 MG tablet Take 5 mg by mouth daily.    . valsartan-hydrochlorothiazide (DIOVAN-HCT) 320-12.5 MG tablet Take 1 tablet by mouth daily.    . pantoprazole (PROTONIX) 40 MG tablet Take 1 tablet (40 mg total) by mouth 2 (two) times daily before a  meal. 60 tablet 3   No current facility-administered medications for this visit.    Allergies as of 11/11/2020 - Review Complete 11/11/2020  Allergen Reaction Noted  . Gabapentin Other (See Comments) 12/17/2018  . Capoten [captopril] Cough 09/10/2019  . Celexa [citalopram hydrobromide]  09/10/2019  . Hydrocodone  11/11/2020  . Lexapro [escitalopram oxalate]  09/10/2019  . Lipitor [atorvastatin calcium] Other (See Comments) 09/13/2019  . Tramadol  09/10/2019    Family History  Problem Relation Age of Onset  . Hyperlipidemia Mother   . Parkinson's disease Father   . Hyperlipidemia Sister   . Alcoholism Brother   . Diabetes Brother   . Colon cancer Neg Hx   . Gastric cancer Neg Hx   . Esophageal cancer Neg Hx     Social History   Socioeconomic History  . Marital status: Married    Spouse name: Not on file  . Number of children: Not on file  . Years of education: Not on file  . Highest education level: Not on file  Occupational History  . Not on file  Tobacco Use  . Smoking status: Former Smoker    Packs/day: 0.50    Years: 6.00    Pack years: 3.00    Types: Cigarettes    Quit date: 09/09/1985    Years since quitting: 35.1  . Smokeless tobacco: Never Used  Vaping Use  . Vaping Use: Never used  Substance and Sexual Activity  . Alcohol use: Not Currently  . Drug use: Never  . Sexual activity: Not Currently  Other Topics Concern  . Not on file  Social History Narrative  . Not on file   Social Determinants of Health   Financial Resource Strain:   . Difficulty of Paying Living Expenses: Not on file  Food Insecurity:   . Worried About Charity fundraiser in the Last Year: Not on file  . Ran Out of Food in the Last Year: Not on file  Transportation Needs:   . Lack of Transportation (Medical): Not on file  . Lack of Transportation (Non-Medical): Not on file  Physical Activity:   . Days of Exercise per Week: Not on file  . Minutes of Exercise per Session: Not  on file  Stress:   . Feeling of Stress : Not on file  Social Connections:   . Frequency of Communication with Friends and Family: Not on file  . Frequency of Social Gatherings with Friends and Family: Not on file  . Attends Religious Services: Not on file  . Active Member of Clubs or Organizations: Not on file  . Attends Archivist Meetings: Not on file  . Marital Status: Not on file  Intimate Partner Violence:   . Fear of Current or Ex-Partner: Not on file  . Emotionally Abused: Not on file  . Physically Abused: Not on file  . Sexually  Abused: Not on file    Subjective: Review of Systems  Constitutional: Negative for chills, fever, malaise/fatigue and weight loss.  HENT: Negative for congestion and sore throat.   Respiratory: Negative for cough and shortness of breath.   Cardiovascular: Negative for chest pain and palpitations.  Gastrointestinal: Positive for heartburn. Negative for abdominal pain, blood in stool, constipation, diarrhea, melena, nausea and vomiting.       Dysphagia  Musculoskeletal: Negative for joint pain and myalgias.  Skin: Negative for rash.  Neurological: Negative for dizziness and weakness.  Endo/Heme/Allergies: Does not bruise/bleed easily.  Psychiatric/Behavioral: Negative for depression. The patient is not nervous/anxious.   All other systems reviewed and are negative.      Objective: BP 121/69   Pulse (!) 57   Temp (!) 97.1 F (36.2 C)   Ht 5\' 5"  (1.651 m)   Wt 167 lb 12.8 oz (76.1 kg)   BMI 27.92 kg/m  Physical Exam Vitals and nursing note reviewed.  Constitutional:      General: She is not in acute distress.    Appearance: Normal appearance. She is well-developed and normal weight. She is not ill-appearing, toxic-appearing or diaphoretic.  HENT:     Head: Normocephalic and atraumatic.     Nose: No congestion or rhinorrhea.  Eyes:     General: No scleral icterus. Cardiovascular:     Rate and Rhythm: Normal rate and regular  rhythm.     Heart sounds: Normal heart sounds.  Pulmonary:     Effort: Pulmonary effort is normal. No respiratory distress.     Breath sounds: Normal breath sounds.  Abdominal:     General: Bowel sounds are normal.     Palpations: Abdomen is soft. There is no hepatomegaly, splenomegaly or mass.     Tenderness: There is no abdominal tenderness. There is no guarding or rebound.     Hernia: No hernia is present.  Skin:    General: Skin is warm and dry.     Coloration: Skin is not jaundiced.     Findings: No rash.  Neurological:     General: No focal deficit present.     Mental Status: She is alert and oriented to person, place, and time.  Psychiatric:        Attention and Perception: Attention normal.        Mood and Affect: Mood normal.        Speech: Speech normal.        Behavior: Behavior normal.        Thought Content: Thought content normal.        Cognition and Memory: Cognition and memory normal.      Assessment:  74 year old female who presents for chronic GERD and dysphagia.  Is been ongoing for couple years.  She states she is asked repeatedly for an EGD but no one has completed anything.  She was seen in the ED for worsening and progressive symptoms and they recommended GI evaluation.  She is not sure when her colonoscopy was exactly, but it was completed at [facility].  We will request results.  GERD: I am not sure she is having true GERD symptoms.  She seems to, defer back to her swallowing difficulties.  Denies esophageal burning.  However, she is on Protonix 40 mg daily and I will increase this to twice a day to see if it results in any improvement  Solid food dysphagia: We will plan for an upper endoscopy with possible dilation.  Differentials include  esophagitis, longstanding GERD, benign stricture, cannot rule out web, ring, malignant process.  Further recommendations will follow.  We will give her dysphagia three diet recommendations in the interim.  If  persistent symptoms despite EGD and dilation we can schedule barium pill esophagram possible speech therapy evaluation   Proceed with EGD with Dr. Gala Romney in near future: the risks, benefits, and alternatives have been discussed with the patient in detail. The patient states understanding and desires to proceed.  Patient is currently on Metformin and glipizide. The patient is not on any other anticoagulants, anxiolytics, chronic pain medications, antidepressants, antidiabetics, or iron supplements.  We will make appropriate adjustments to her diabetic medications prior to procedure.   Plan: 1. Request previous colonoscopy from [facility] 2. Increased x40 mg twice daily 3. EGD with possible dilation 4. Dysphagia three diet in the interim 5. Follow-up in 3 months 6. Call us for any worsening or severe symptoms    Thank you for allowing [place] to participate in the care of [place], DNP, AGNP-C Adult & Gerontological Nurse Practitioner [facility]   11/11/2020 2:09 PM   Disclaimer: This note was dictated with voice recognition software. Similar sounding words can inadvertently be transcribed and may not be corrected upon review.

## 2020-11-11 NOTE — Patient Instructions (Addendum)
Your health issues we discussed today were:   GERD (reflux/heartburn): 1. I sent a new prescription to your pharmacy to increase your Protonix to 40 mg twice daily 2. Take this pursing in the morning and 30 minutes for your last meal the day 3. You can continue to use the pills you have on hand at a twice daily dosing, but she will run out sooner at which point you can refill with a new prescription 4. Let us know if you have any worsening or severe symptoms  Dysphagia (swallowing difficulties): 1. I am printing information below about swallowing precautions to help prevent worsening symptoms 2. If food gets stuck in your esophagus and will not go forward and will not regurgitate back and last for longer than 2 hours then proceed to the emergency room 3. We will schedule an upper endoscopy with possible dilation for you to help evaluate and treat your symptoms 4. We will also request your previous colonoscopy report from [facility] to determine when you will be due for another colonoscopy, if ever  Overall I recommend:  1. Continue your other current medications 2. Return for follow-up in 3 months 3. Call us for any questions or concerns   At [facility] we value your feedback. You may receive a survey about your visit today. Please share your experience as we strive to create trusting relationships with our patients to provide genuine, compassionate, quality care.  We appreciate your understanding and patience as we review any laboratory studies, imaging, and other diagnostic tests that are ordered as we care for you. Our office policy is 5 business days for review of these results, and any emergent or urgent results are addressed in a timely manner for your best interest. If you do not hear from our office in 1 week, please contact us.   We also encourage the use of MyChart, which contains your medical information for your review as well. If you are not enrolled in this  feature, an access code is on this after visit summary for your convenience. Thank you for allowing [place] to be involved in your care.  It was great to see you today!  I hope you have a Happy Thanksgiving!!       Dysphagia Eating Plan, Bite Size Food This diet plan is for people with moderate swallowing problems who have transitioned from pureed and minced foods. Bite size foods are soft and cut into small chunks so that they can be swallowed safely. On this eating plan, you may be instructed to drink liquids that are thickened. Work with your health care provider and your diet and nutrition specialist (dietitian) to make sure that you are following the diet safely and getting all the nutrients you need. What are tips for following this plan? General guidelines for foods   You may eat foods that are tender, soft, and moist.  Always test food texture before taking a bite. Poke food with a fork or spoon to make sure it is tender.  Food should be easy to cut and shew. Avoid large pieces of food that require a lot of chewing.  Take small bites. Each bite should be smaller than your thumb nail (about 43mm by 15 mm).  If you were on pureed and minced food diet plans, you may eat any of the foods included in those diets.  Avoid foods that are very dry, hard, sticky, chewy, coarse, or crunchy.  If instructed by your health care provider, thicken liquids.  Follow your health care provider's instructions for what products to use, how to do this, and to what thickness. ? Your health care provider may recommend using a commercial thickener, rice cereal, or potato flakes. Ask your health care provider to recommend thickeners. ? Thickened liquids are usually a "pudding-like" consistency, or they may be as thick as honey or thick enough to eat with a spoon. Cooking  To moisten foods, you may add liquids while you are blending, mashing, or grinding your foods to the right consistency. These liquids  include gravies, sauces, vegetable or fruit juice, milk, half and half, or water.  Strain extra liquid from foods before eating.  Reheat foods slowly to prevent a tough crust from forming.  Prepare foods in advance. Meal planning  Eat a variety of foods to get all the nutrients you need.  Some foods may be tolerated better than others. Work with your dietitian to identify which foods are safest for you to eat.  Follow your meal plan as told by your dietitian. What foods are allowed? Grains Moist breads without nuts or seeds. Biscuits, muffins, pancakes, and waffles that are well-moistened with syrup, jelly, margarine, or butter. Cooked cereals. Moist bread stuffing. Moist rice. Well-moistened cold cereal with small chunks. Well-cooked pasta, noodles, rice, and bread dressing in small pieces and thick sauce. Soft dumplings or spaetzle in small pieces and butter or gravy. Vegetables Soft, well-cooked vegetables in small pieces. Soft-cooked, mashed potatoes. Thickened vegetable juice. Fruits Canned or cooked fruits that are soft or moist and do not have skin or seeds. Fresh, soft bananas. Thickened fruit juices. Meat and other protein foods Tender, moist meats or poultry in small pieces. Moist meatballs or meatloaf. Fish without bones. Eggs or egg substitutes in small pieces. Tofu. [place] and meat alternatives in small pieces. Well-cooked, tender beans, peas, baked beans, and other legumes. Dairy Thickened milk. Cream cheese. Yogurt. Cottage cheese. Sour cream. Small pieces of soft cheese. Fats and oils Butter. Oils. Margarine. Mayonnaise. Gravy. Spreads. Sweets and desserts Soft, smooth, moist desserts. Pudding. Custard. Moist cakes. Jam. Jelly. Honey. Preserves. Ask your health care provider whether you can have frozen desserts. Seasoning and other foods All seasonings and sweeteners. All sauces with small chunks. Prepared tuna, egg, or chicken salad without raw fruits or vegetables.  Moist casseroles with small, tender pieces of meat. Soups with tender meat. What foods are not allowed? Grains Coarse or dry cereals. Dry breads. Toast. Crackers. Tough, crusty breads, such as [place] bread and baguettes. Dry pancakes, waffles, and muffins. Sticky rice. Dry bread stuffing. Granola. Popcorn. Chips. Vegetables All raw vegetables. Cooked corn. Rubbery or stiff cooked vegetables. Stringy vegetables, such as celery. Tough, crisp fried potatoes. Potato skins. Fruits Hard, crunchy, stringy, high-pulp, and juicy raw fruits such as apples, pineapple, papaya, and watermelon. Small, round fruits, such as grapes. Dried fruit and fruit leather. Meat and other protein foods Large pieces of meat. Dry, tough meats, such as bacon, sausage, and hot dogs. Chicken, [place], or fish with skin and bones. Crunchy peanut butter. Nuts. Seeds. Nut and seed butters. Dairy Yogurt with nuts, seeds, or large chunks. Large chunks of cheese. Frozen desserts and milk consistency not allowed by your dietitian. Sweets and desserts Dry cakes. Chewy or dry cookies. Any desserts with nuts, seeds, dry fruits, coconut, pineapple, or anything dry, sticky, or hard. Chewy caramel. Licorice. Taffy-type candies. Ask your health care provider whether you can have frozen desserts. Seasoning and other foods Soups with tough or large chunks of  meats, poultry, or vegetables. Corn or clam chowder. Smoothies with large chunks of fruit. Summary  Bite size foods can be helpful for people with moderate swallowing problems.  On the dysphagia eating plan, you may eat foods that are soft, moist, and cut into pieces smaller than 27mm by 34mm.  You may be instructed to thicken liquids. Follow your health care provider's instructions about how to do this and to what consistency. This information is not intended to replace advice given to you by your health care provider. Make sure you discuss any questions you have with your health care  provider. Document Revised: 04/04/2019 Document Reviewed: 03/24/2017 Elsevier Patient Education  [place].     Gastroesophageal Reflux Disease, Adult Gastroesophageal reflux (GER) happens when acid from the stomach flows up into the tube that connects the mouth and the stomach (esophagus). Normally, food travels down the esophagus and stays in the stomach to be digested. However, when a person has GER, food and stomach acid sometimes move back up into the esophagus. If this becomes a more serious problem, the person may be diagnosed with a disease called gastroesophageal reflux disease (GERD). GERD occurs when the reflux:  Happens often.  Causes frequent or severe symptoms.  Causes problems such as damage to the esophagus. When stomach acid comes in contact with the esophagus, the acid may cause soreness (inflammation) in the esophagus. Over time, GERD may create small holes (ulcers) in the lining of the esophagus. What are the causes? This condition is caused by a problem with the muscle between the esophagus and the stomach (lower esophageal sphincter, or LES). Normally, the LES muscle closes after food passes through the esophagus to the stomach. When the LES is weakened or abnormal, it does not close properly, and that allows food and stomach acid to go back up into the esophagus. The LES can be weakened by certain dietary substances, medicines, and medical conditions, including:  Tobacco use.  Pregnancy.  Having a hiatal hernia.  Alcohol use.  Certain foods and beverages, such as coffee, chocolate, onions, and peppermint. What increases the risk? You are more likely to develop this condition if you:  Have an increased body weight.  Have a connective tissue disorder.  Use NSAID medicines. What are the signs or symptoms? Symptoms of this condition include:  Heartburn.  Difficult or painful swallowing.  The feeling of having a lump in the throat.  Abitter  taste in the mouth.  Bad breath.  Having a large amount of saliva.  Having an upset or bloated stomach.  Belching.  Chest pain. Different conditions can cause chest pain. Make sure you see your health care provider if you experience chest pain.  Shortness of breath or wheezing.  Ongoing (chronic) cough or a night-time cough.  Wearing away of tooth enamel.  Weight loss. How is this diagnosed? Your health care provider will take a medical history and perform a physical exam. To determine if you have mild or severe GERD, your health care provider may also monitor how you respond to treatment. You may also have tests, including:  A test to examine your stomach and esophagus with a small camera (endoscopy).  A test thatmeasures the acidity level in your esophagus.  A test thatmeasures how much pressure is on your esophagus.  A barium swallow or modified barium swallow test to show the shape, size, and functioning of your esophagus. How is this treated? The goal of treatment is to help relieve your symptoms and  to prevent complications. Treatment for this condition may vary depending on how severe your symptoms are. Your health care provider may recommend:  Changes to your diet.  Medicine.  Surgery. Follow these instructions at home: Eating and drinking   Follow a diet as recommended by your health care provider. This may involve avoiding foods and drinks such as: ? Coffee and tea (with or without caffeine). ? Drinks that containalcohol. ? Energy drinks and sports drinks. ? Carbonated drinks or sodas. ? Chocolate and cocoa. ? Peppermint and mint flavorings. ? Garlic and onions. ? Horseradish. ? Spicy and acidic foods, including peppers, chili powder, curry powder, vinegar, hot sauces, and barbecue sauce. ? Citrus fruit juices and citrus fruits, such as oranges, lemons, and limes. ? Tomato-based foods, such as red sauce, chili, salsa, and pizza with red sauce. ? Fried  and fatty foods, such as donuts, french fries, potato chips, and high-fat dressings. ? High-fat meats, such as hot dogs and fatty cuts of red and white meats, such as rib eye steak, sausage, ham, and bacon. ? High-fat dairy items, such as whole milk, butter, and cream cheese.  Eat small, frequent meals instead of large meals.  Avoid drinking large amounts of liquid with your meals.  Avoid eating meals during the 2-3 hours before bedtime.  Avoid lying down right after you eat.  Do not exercise right after you eat. Lifestyle   Do not use any products that contain nicotine or tobacco, such as cigarettes, e-cigarettes, and chewing tobacco. If you need help quitting, ask your health care provider.  Try to reduce your stress by using methods such as yoga or meditation. If you need help reducing stress, ask your health care provider.  If you are overweight, reduce your weight to an amount that is healthy for you. Ask your health care provider for guidance about a safe weight loss goal. General instructions  Pay attention to any changes in your symptoms.  Take over-the-counter and prescription medicines only as told by your health care provider. Do not take aspirin, ibuprofen, or other NSAIDs unless your health care provider told you to do so.  Wear loose-fitting clothing. Do not wear anything tight around your waist that causes pressure on your abdomen.  Raise (elevate) the head of your bed about 6 inches (15 cm).  Avoid bending over if this makes your symptoms worse.  Keep all follow-up visits as told by your health care provider. This is important. Contact a health care provider if:  You have: ? New symptoms. ? Unexplained weight loss. ? Difficulty swallowing or it hurts to swallow. ? Wheezing or a persistent cough. ? A hoarse voice.  Your symptoms do not improve with treatment. Get help right away if you:  Have pain in your arms, neck, jaw, teeth, or back.  Feel sweaty,  dizzy, or light-headed.  Have chest pain or shortness of breath.  Vomit and your vomit looks like blood or coffee grounds.  Faint.  Have stool that is bloody or black.  Cannot swallow, drink, or eat. Summary  Gastroesophageal reflux happens when acid from the stomach flows up into the esophagus. GERD is a disease in which the reflux happens often, causes frequent or severe symptoms, or causes problems such as damage to the esophagus.  Treatment for this condition may vary depending on how severe your symptoms are. Your health care provider may recommend diet and lifestyle changes, medicine, or surgery.  Contact a health care provider if you have new or  worsening symptoms.  Take over-the-counter and prescription medicines only as told by your health care provider. Do not take aspirin, ibuprofen, or other NSAIDs unless your health care provider told you to do so.  Keep all follow-up visits as told by your health care provider. This is important. This information is not intended to replace advice given to you by your health care provider. Make sure you discuss any questions you have with your health care provider. Document Revised: 06/20/2018 Document Reviewed: 06/20/2018 Elsevier Patient Education  2020 [place] for Gastroesophageal Reflux Disease, Adult When you have gastroesophageal reflux disease (GERD), the foods you eat and your eating habits are very important. Choosing the right foods can help ease the discomfort of GERD. Consider working with a diet and nutrition specialist (dietitian) to help you make healthy food choices. What general guidelines should I follow?  Eating plan  Choose healthy foods low in fat, such as fruits, vegetables, whole grains, low-fat dairy products, and lean meat, fish, and poultry.  Eat frequent, small meals instead of three large meals each day. Eat your meals slowly, in a relaxed setting. Avoid bending over or lying down  until 2-3 hours after eating.  Limit high-fat foods such as fatty meats or fried foods.  Limit your intake of oils, butter, and shortening to less than 8 teaspoons each day.  Avoid the following: ? Foods that cause symptoms. These may be different for different people. Keep a food diary to keep track of foods that cause symptoms. ? Alcohol. ? Drinking large amounts of liquid with meals. ? Eating meals during the 2-3 hours before bed.  Cook foods using methods other than frying. This may include baking, grilling, or broiling. Lifestyle  Maintain a healthy weight. Ask your health care provider what weight is healthy for you. If you need to lose weight, work with your health care provider to do so safely.  Exercise for at least 30 minutes on 5 or more days each week, or as told by your health care provider.  Avoid wearing clothes that fit tightly around your waist and chest.  Do not use any products that contain nicotine or tobacco, such as cigarettes and e-cigarettes. If you need help quitting, ask your health care provider.  Sleep with the head of your bed raised. Use a wedge under the mattress or blocks under the bed frame to raise the head of the bed. What foods are not recommended? The items listed may not be a complete list. Talk with your dietitian about what dietary choices are best for you. Grains Pastries or quick breads with added fat. [place] toast. Vegetables Deep fried vegetables. [place] fries. Any vegetables prepared with added fat. Any vegetables that cause symptoms. For some people this may include tomatoes and tomato products, chili peppers, onions and garlic, and horseradish. Fruits Any fruits prepared with added fat. Any fruits that cause symptoms. For some people this may include citrus fruits, such as oranges, grapefruit, pineapple, and lemons. Meats and other protein foods High-fat meats, such as fatty beef or pork, hot dogs, ribs, ham, sausage, salami and bacon.  Fried meat or protein, including fried fish and fried chicken. Nuts and nut butters. Dairy Whole milk and chocolate milk. Sour cream. Cream. Ice cream. Cream cheese. Milk shakes. Beverages Coffee and tea, with or without caffeine. Carbonated beverages. Sodas. Energy drinks. Fruit juice made with acidic fruits (such as orange or grapefruit). Tomato juice. Alcoholic drinks. Fats and oils Butter.  Margarine. Shortening. Ghee. Sweets and desserts Chocolate and cocoa. Donuts. Seasoning and other foods Pepper. Peppermint and spearmint. Any condiments, herbs, or seasonings that cause symptoms. For some people, this may include curry, hot sauce, or vinegar-based salad dressings. Summary  When you have gastroesophageal reflux disease (GERD), food and lifestyle choices are very important to help ease the discomfort of GERD.  Eat frequent, small meals instead of three large meals each day. Eat your meals slowly, in a relaxed setting. Avoid bending over or lying down until 2-3 hours after eating.  Limit high-fat foods such as fatty meat or fried foods. This information is not intended to replace advice given to you by your health care provider. Make sure you discuss any questions you have with your health care provider. Document Revised: 04/04/2019 Document Reviewed: 12/13/2016 Elsevier Patient Education  [place].

## 2020-11-12 DIAGNOSIS — E1122 Type 2 diabetes mellitus with diabetic chronic kidney disease: Secondary | ICD-10-CM | POA: Diagnosis not present

## 2020-11-12 DIAGNOSIS — M109 Gout, unspecified: Secondary | ICD-10-CM | POA: Diagnosis not present

## 2020-11-12 DIAGNOSIS — I10 Essential (primary) hypertension: Secondary | ICD-10-CM | POA: Diagnosis not present

## 2020-11-12 DIAGNOSIS — E782 Mixed hyperlipidemia: Secondary | ICD-10-CM | POA: Diagnosis not present

## 2020-11-12 DIAGNOSIS — K219 Gastro-esophageal reflux disease without esophagitis: Secondary | ICD-10-CM | POA: Diagnosis not present

## 2020-11-12 DIAGNOSIS — Z1329 Encounter for screening for other suspected endocrine disorder: Secondary | ICD-10-CM | POA: Diagnosis not present

## 2020-11-12 DIAGNOSIS — N183 Chronic kidney disease, stage 3 unspecified: Secondary | ICD-10-CM | POA: Diagnosis not present

## 2020-11-16 DIAGNOSIS — I10 Essential (primary) hypertension: Secondary | ICD-10-CM | POA: Diagnosis not present

## 2020-11-16 DIAGNOSIS — E782 Mixed hyperlipidemia: Secondary | ICD-10-CM | POA: Diagnosis not present

## 2020-11-16 DIAGNOSIS — E739 Lactose intolerance, unspecified: Secondary | ICD-10-CM | POA: Diagnosis not present

## 2020-11-16 DIAGNOSIS — M1000 Idiopathic gout, unspecified site: Secondary | ICD-10-CM | POA: Diagnosis not present

## 2020-11-16 DIAGNOSIS — E1122 Type 2 diabetes mellitus with diabetic chronic kidney disease: Secondary | ICD-10-CM | POA: Diagnosis not present

## 2020-11-16 DIAGNOSIS — Z9189 Other specified personal risk factors, not elsewhere classified: Secondary | ICD-10-CM | POA: Diagnosis not present

## 2020-11-16 DIAGNOSIS — M8580 Other specified disorders of bone density and structure, unspecified site: Secondary | ICD-10-CM | POA: Diagnosis not present

## 2020-11-16 DIAGNOSIS — Z1212 Encounter for screening for malignant neoplasm of rectum: Secondary | ICD-10-CM | POA: Diagnosis not present

## 2020-11-16 DIAGNOSIS — E1169 Type 2 diabetes mellitus with other specified complication: Secondary | ICD-10-CM | POA: Diagnosis not present

## 2020-11-16 DIAGNOSIS — F331 Major depressive disorder, recurrent, moderate: Secondary | ICD-10-CM | POA: Diagnosis not present

## 2020-11-16 DIAGNOSIS — K7581 Nonalcoholic steatohepatitis (NASH): Secondary | ICD-10-CM | POA: Diagnosis not present

## 2020-11-16 DIAGNOSIS — G43909 Migraine, unspecified, not intractable, without status migrainosus: Secondary | ICD-10-CM | POA: Diagnosis not present

## 2020-11-16 DIAGNOSIS — N183 Chronic kidney disease, stage 3 unspecified: Secondary | ICD-10-CM | POA: Diagnosis not present

## 2020-11-17 ENCOUNTER — Other Ambulatory Visit (HOSPITAL_COMMUNITY)
Admission: RE | Admit: 2020-11-17 | Discharge: 2020-11-17 | Disposition: A | Discharge status: Home / Self Care | Payer: Medicare Other | Source: Ambulatory Visit | Attending: Internal Medicine | Admitting: Internal Medicine

## 2020-11-17 ENCOUNTER — Other Ambulatory Visit: Payer: Self-pay

## 2020-11-17 DIAGNOSIS — Z20822 Contact with and (suspected) exposure to covid-19: Secondary | ICD-10-CM | POA: Insufficient documentation

## 2020-11-17 DIAGNOSIS — Z01812 Encounter for preprocedural laboratory examination: Secondary | ICD-10-CM | POA: Diagnosis not present

## 2020-11-17 LAB — SARS CORONAVIRUS 2 (TAT 6-24 HRS): SARS Coronavirus 2: NEGATIVE

## 2020-11-18 ENCOUNTER — Encounter (HOSPITAL_COMMUNITY)
Admission: RE | Disposition: A | Discharge status: Home / Self Care | Payer: Self-pay | Source: Home / Self Care | Attending: Internal Medicine

## 2020-11-18 ENCOUNTER — Encounter (HOSPITAL_COMMUNITY): Payer: Self-pay | Admitting: Internal Medicine

## 2020-11-18 ENCOUNTER — Ambulatory Visit (HOSPITAL_COMMUNITY)
Admission: RE | Admit: 2020-11-18 | Discharge: 2020-11-18 | Disposition: A | Discharge status: Home / Self Care | Payer: Medicare Other | Attending: Internal Medicine | Admitting: Internal Medicine

## 2020-11-18 ENCOUNTER — Other Ambulatory Visit: Payer: Self-pay

## 2020-11-18 DIAGNOSIS — Z87891 Personal history of nicotine dependence: Secondary | ICD-10-CM | POA: Diagnosis not present

## 2020-11-18 DIAGNOSIS — Z7982 Long term (current) use of aspirin: Secondary | ICD-10-CM | POA: Diagnosis not present

## 2020-11-18 DIAGNOSIS — Z888 Allergy status to other drugs, medicaments and biological substances status: Secondary | ICD-10-CM | POA: Diagnosis not present

## 2020-11-18 DIAGNOSIS — Z79899 Other long term (current) drug therapy: Secondary | ICD-10-CM | POA: Insufficient documentation

## 2020-11-18 DIAGNOSIS — Z885 Allergy status to narcotic agent status: Secondary | ICD-10-CM | POA: Diagnosis not present

## 2020-11-18 DIAGNOSIS — K219 Gastro-esophageal reflux disease without esophagitis: Secondary | ICD-10-CM | POA: Diagnosis not present

## 2020-11-18 DIAGNOSIS — K449 Diaphragmatic hernia without obstruction or gangrene: Secondary | ICD-10-CM | POA: Insufficient documentation

## 2020-11-18 DIAGNOSIS — R1310 Dysphagia, unspecified: Secondary | ICD-10-CM | POA: Insufficient documentation

## 2020-11-18 DIAGNOSIS — Z7984 Long term (current) use of oral hypoglycemic drugs: Secondary | ICD-10-CM | POA: Insufficient documentation

## 2020-11-18 HISTORY — PX: MALONEY DILATION: SHX5535

## 2020-11-18 HISTORY — PX: ESOPHAGOGASTRODUODENOSCOPY: SHX5428

## 2020-11-18 LAB — GLUCOSE, CAPILLARY: Glucose-Capillary: 149 mg/dL — ABNORMAL HIGH (ref 70–99)

## 2020-11-18 SURGERY — EGD (ESOPHAGOGASTRODUODENOSCOPY)
Anesthesia: Moderate Sedation

## 2020-11-18 MED ORDER — MIDAZOLAM HCL 5 MG/5ML IJ SOLN
INTRAMUSCULAR | Status: DC | PRN
  Administered 2020-11-18: 1 mg via INTRAVENOUS
  Administered 2020-11-18: 2 mg via INTRAVENOUS
  Administered 2020-11-18: 1 mg via INTRAVENOUS

## 2020-11-18 MED ORDER — STERILE WATER FOR IRRIGATION IR SOLN
Status: DC | PRN
  Administered 2020-11-18: 1.5 mL

## 2020-11-18 MED ORDER — MEPERIDINE HCL 50 MG/ML IJ SOLN
INTRAMUSCULAR | Status: AC
  Filled 2020-11-18: qty 1

## 2020-11-18 MED ORDER — ONDANSETRON HCL 4 MG/2ML IJ SOLN
INTRAMUSCULAR | Status: DC | PRN
  Administered 2020-11-18: 4 mg via INTRAVENOUS

## 2020-11-18 MED ORDER — MIDAZOLAM HCL 5 MG/5ML IJ SOLN
INTRAMUSCULAR | Status: AC
  Filled 2020-11-18: qty 10

## 2020-11-18 MED ORDER — LIDOCAINE VISCOUS HCL 2 % MT SOLN
OROMUCOSAL | Status: DC | PRN
  Administered 2020-11-18: 5 mL via OROMUCOSAL

## 2020-11-18 MED ORDER — ONDANSETRON HCL 4 MG/2ML IJ SOLN
INTRAMUSCULAR | Status: AC
  Filled 2020-11-18: qty 2

## 2020-11-18 MED ORDER — LIDOCAINE VISCOUS HCL 2 % MT SOLN
OROMUCOSAL | Status: AC
  Filled 2020-11-18: qty 15

## 2020-11-18 MED ORDER — MEPERIDINE HCL 100 MG/ML IJ SOLN
INTRAMUSCULAR | Status: DC | PRN
  Administered 2020-11-18: 15 mg via INTRAVENOUS
  Administered 2020-11-18: 25 mg via INTRAVENOUS

## 2020-11-18 MED ORDER — SODIUM CHLORIDE 0.9 % IV SOLN: INTRAVENOUS | Status: DC

## 2020-11-18 NOTE — Interval H&P Note (Signed)
History and Physical Interval Note:  11/18/2020 7:30 AM  Debra Vasquez  has presented today for surgery, with the diagnosis of dysphagia.  The various methods of treatment have been discussed with the patient and family. After consideration of risks, benefits and other options for treatment, the patient has consented to  Procedure(s) with comments: ESOPHAGOGASTRODUODENOSCOPY (EGD) (N/A) - 7:30am MALONEY DILATION (N/A) as a surgical intervention.  The patient's history has been reviewed, patient examined, no change in status, stable for surgery.  I have reviewed the patient's chart and labs.  Questions were answered to the patient's satisfaction.     Debra Vasquez  Patient seen and examined.  No change.  EGD with esophageal dilation as feasible/appropriate today per plan. The risks, benefits, limitations, alternatives and imponderables have been reviewed with the patient. Potential for esophageal dilation, biopsy, etc. have also been reviewed.  Questions have been answered. All parties agreeable.

## 2020-11-18 NOTE — Op Note (Signed)
[facility] Patient Name: [place] Procedure Date: 11/18/2020 7:10 AM MRN: [id number] Date of Birth: [birth year] Attending MD: Norvel Richards , MD CSN: [id number] Age: 74 Admit Type: Outpatient Procedure:                Upper GI endoscopy Indications:              Dysphagia Providers:                Norvel Richards, MD, Charlsie Quest. Insurance claims handler, Therapist, sports,                            Suzan Garibaldi. Risa Grill, Technician, Nelma Rothman,                            Merchant [employer] officer Referring MD:              Medicines:                Midazolam 4 mg IV, Meperidine 40 mg IV Complications:            No immediate complications. Estimated Blood Loss:     Estimated blood loss was minimal. Procedure:                Pre-Anesthesia Assessment:                           - Prior to the procedure, a History and Physical                            was performed, and patient medications and                            allergies were reviewed. The patient's tolerance of                            previous anesthesia was also reviewed. The risks                            and benefits of the procedure and the sedation                            options and risks were discussed with the patient.                            All questions were answered, and informed consent                            was obtained. Prior Anticoagulants: The patient has                            taken no previous anticoagulant or antiplatelet                            agents. ASA Grade Assessment: III - A patient with  severe systemic disease. After reviewing the risks                            and benefits, the patient was deemed in                            satisfactory condition to undergo the procedure.                           After obtaining informed consent, the endoscope was                            passed under direct vision. Throughout the                            procedure, the  patient's blood pressure, pulse, and                            oxygen saturations were monitored continuously. The                            GIF-H190 ([id number]) scope was introduced through the                            mouth, and advanced to the second part of duodenum. Scope In: 7:45:23 AM Scope Out: 7:51:49 AM Total Procedure Duration: 0 hours 6 minutes 26 seconds  Findings:      The examined esophagus was normal.      A small hiatal hernia was present.      Otherwise, gastric mucosa appeared normal except for slightly polypoid       appearing antral mucosa.      The duodenal bulb and second portion of the duodenum were normal. The       scope was withdrawn. Dilation was performed with a Maloney dilator with       mild resistance at 45 Fr. The dilation site was examined following       endoscope reinsertion and showed moderate mucosal disruption. Estimated       blood loss was minimal. There was a question of an occult submucosal       ring at the GE junction that was disrupted after passage of the dilator. Impression:               - Normal esophagus. Dilated. Query occult mucosal                            ring at the GE junction?disrupted with dilation                           - Small hiatal hernia.                           - Normal duodenal bulb and second portion of the                            duodenum.                           -  No specimens collected. Moderate Sedation:      Moderate (conscious) sedation was administered by the endoscopy nurse       and supervised by the endoscopist. The following parameters were       monitored: oxygen saturation, heart rate, blood pressure, respiratory       rate, EKG, adequacy of pulmonary ventilation, and response to care.       Total physician intraservice time was 11 minutes. Recommendation:           - Patient has a contact number available for                            emergencies. The signs and symptoms of potential                             delayed complications were discussed with the                            patient. Return to normal activities tomorrow.                            Written discharge instructions were provided to the                            patient.                           - Resume previous diet.                           - Continue present medications. Continue Protonix                            40 mg twice daily. Office aloe up in several weeks                            as scheduled.                           - Return to my office (date not yet determined). Procedure Code(s):        --- Professional ---                           [phone], Esophagogastroduodenoscopy, flexible,                            transoral; diagnostic, including collection of                            specimen(s) by brushing or washing, when performed                            (separate procedure)                           84696, Dilation of esophagus, by unguided sound or  bougie, single or multiple passes                           G0500, Moderate sedation services provided by the                            same physician or other qualified health care                            professional performing a gastrointestinal                            endoscopic service that sedation supports,                            requiring the presence of an independent trained                            observer to assist in the monitoring of the                            patient's level of consciousness and physiological                            status; initial 15 minutes of intra-service time;                            patient age 41 years or older (additional time may                            be reported with [phone], as appropriate) Diagnosis Code(s):        --- Professional ---                           K44.9, Diaphragmatic hernia without obstruction or                             gangrene                           R13.10, Dysphagia, unspecified CPT copyright 2019 American Medical Association. All rights reserved. The codes documented in this report are preliminary and upon coder review may  be revised to meet current compliance requirements. Cristopher Estimable. Argusta Mcgann, MD Norvel Richards, MD 11/18/2020 8:07:26 AM This report has been signed electronically. Number of Addenda: 0

## 2020-11-18 NOTE — Discharge Instructions (Signed)
EGD Discharge instructions Please read the instructions outlined below and refer to this sheet in the next few weeks. These discharge instructions provide you with general information on caring for yourself after you leave the hospital. Your doctor may also give you specific instructions. While your treatment has been planned according to the most current medical practices available, unavoidable complications occasionally occur. If you have any problems or questions after discharge, please call your doctor. ACTIVITY  You may resume your regular activity but move at a slower pace for the next 24 hours.   Take frequent rest periods for the next 24 hours.   Walking will help expel (get rid of) the air and reduce the bloated feeling in your abdomen.   No driving for 24 hours (because of the anesthesia (medicine) used during the test).   You may shower.   Do not sign any important legal documents or operate any machinery for 24 hours (because of the anesthesia used during the test).  NUTRITION  Drink plenty of fluids.   You may resume your normal diet.   Begin with a light meal and progress to your normal diet.   Avoid alcoholic beverages for 24 hours or as instructed by your caregiver.  MEDICATIONS  You may resume your normal medications unless your caregiver tells you otherwise.  WHAT YOU CAN EXPECT TODAY  You may experience abdominal discomfort such as a feeling of fullness or "gas" pains.  FOLLOW-UP  Your doctor will discuss the results of your test with you.  SEEK IMMEDIATE MEDICAL ATTENTION IF ANY OF THE FOLLOWING OCCUR:  Excessive nausea (feeling sick to your stomach) and/or vomiting.   Severe abdominal pain and distention (swelling).   Trouble swallowing.   Temperature over 101 F (37.8 C).   Rectal bleeding or vomiting of blood.    Esophageal Dilatation Esophageal dilatation, also called esophageal dilation, is a procedure to widen or open (dilate) a blocked or  narrowed part of the esophagus. The esophagus is the part of the body that moves food and liquid from the mouth to the stomach. You may need this procedure if:  You have a buildup of scar tissue in your esophagus that makes it difficult, painful, or impossible to swallow. This can be caused by gastroesophageal reflux disease (GERD).  You have cancer of the esophagus.  There is a problem with how food moves through your esophagus. In some cases, you may need this procedure repeated at a later time to dilate the esophagus gradually. Tell a health care provider about:  Any allergies you have.  All medicines you are taking, including vitamins, herbs, eye drops, creams, and over-the-counter medicines.  Any problems you or family members have had with anesthetic medicines.  Any blood disorders you have.  Any surgeries you have had.  Any medical conditions you have.  Any antibiotic medicines you are required to take before dental procedures.  Whether you are pregnant or may be pregnant. What are the risks? Generally, this is a safe procedure. However, problems may occur, including:  Bleeding due to a tear in the lining of the esophagus.  A hole (perforation) in the esophagus. What happens before the procedure?  Follow instructions from your health care provider about eating or drinking restrictions.  Ask your health care provider about changing or stopping your regular medicines. This is especially important if you are taking diabetes medicines or blood thinners.  Plan to have someone take you home from the hospital or clinic.  Plan to  have a responsible adult care for you for at least 24 hours after you leave the hospital or clinic. This is important. What happens during the procedure?  You may be given a medicine to help you relax (sedative).  A numbing medicine may be sprayed into the back of your throat, or you may gargle the medicine.  Your health care provider may perform  the dilatation using various surgical instruments, such as: ? Simple dilators. This instrument is carefully placed in the esophagus to stretch it. ? Guided wire bougies. This involves using an endoscope to insert a wire into the esophagus. A dilator is passed over this wire to enlarge the esophagus. Then the wire is removed. ? Balloon dilators. An endoscope with a small balloon at the end is inserted into the esophagus. The balloon is inflated to stretch the esophagus and open it up. The procedure may vary among health care providers and hospitals. What happens after the procedure?  Your blood pressure, heart rate, breathing rate, and blood oxygen level will be monitored until the medicines you were given have worn off.  Your throat may feel slightly sore and numb. This will improve slowly over time.  You will not be allowed to eat or drink until your throat is no longer numb.  When you are able to drink, urinate, and sit on the edge of the bed without nausea or dizziness, you may be able to return home. Follow these instructions at home:  Take over-the-counter and prescription medicines only as told by your health care provider.  Do not drive for 24 hours if you were given a sedative during your procedure.  You should have a responsible adult with you for 24 hours after the procedure.  Follow instructions from your health care provider about any eating or drinking restrictions.  Do not use any products that contain nicotine or tobacco, such as cigarettes and e-cigarettes. If you need help quitting, ask your health care provider.  Keep all follow-up visits as told by your health care provider. This is important. Get help right away if you:  Have a fever.  Have chest pain.  Have pain that is not relieved by medication.  Have trouble breathing.  Have trouble swallowing.  Vomit blood. Summary  Esophageal dilatation, also called esophageal dilation, is a procedure to widen or  open (dilate) a blocked or narrowed part of the esophagus.  Plan to have someone take you home from the hospital or clinic.  For this procedure, a numbing medicine may be sprayed into the back of your throat, or you may gargle the medicine.  Do not drive for 24 hours if you were given a sedative during your procedure. This information is not intended to replace advice given to you by your health care provider. Make sure you discuss any questions you have with your health care provider. Document Revised: 10/09/2019 Document Reviewed: 10/17/2017 Elsevier Patient Education  [place].    Your esophagus was stretched today  Continue Protonix 40 mg twice daily  Keep your upcoming office visit with Walden Field  At patient request, I called Scarlette Slice at [phone].  Got voicemail.  Left a message.

## 2020-11-24 ENCOUNTER — Encounter (HOSPITAL_COMMUNITY): Payer: Self-pay | Admitting: Internal Medicine

## 2020-11-24 ENCOUNTER — Telehealth: Payer: Self-pay | Admitting: Internal Medicine

## 2020-11-24 DIAGNOSIS — I129 Hypertensive chronic kidney disease with stage 1 through stage 4 chronic kidney disease, or unspecified chronic kidney disease: Secondary | ICD-10-CM | POA: Diagnosis not present

## 2020-11-24 DIAGNOSIS — N183 Chronic kidney disease, stage 3 unspecified: Secondary | ICD-10-CM | POA: Diagnosis not present

## 2020-11-24 DIAGNOSIS — E1122 Type 2 diabetes mellitus with diabetic chronic kidney disease: Secondary | ICD-10-CM | POA: Diagnosis not present

## 2020-11-24 NOTE — Telephone Encounter (Signed)
Pt is having problems with reflux again. She said she had procedure last week. Please advise. [phone]

## 2020-11-24 NOTE — Telephone Encounter (Signed)
FYI Spoke with pt. Pt is taking Pantoprazole 40 mg bid. Pt states her Jerrye Bushy has been doing well except for last night. Pt ate some [place], dressing and veggies. Pt was asked if she is taking the Pantoprazole 40 mg 30 mins prior to breakfast and 30 mins prior to dinner, pt said she has been taking the meds and eating. Pt is going to start taking the Pantoprazole 40 mg 30 mins before breakfast and dinner with water. Pt will call back if she continues to have problems with her Jerrye Bushy.

## 2020-11-25 NOTE — Telephone Encounter (Signed)
Spoke with pt. Pt was notified of Walden Field, NP recommendations. Pt will call back if needed.

## 2020-11-25 NOTE — Telephone Encounter (Signed)
Also, for occasional flares due to dietary indiscretions or at random she can also use over-the-counter Tums, Rolaids, or the like to help for breakthrough.  Her Protonix should control her symptoms the majority of time but there will be intermittent "bad days"

## 2020-12-02 DIAGNOSIS — J019 Acute sinusitis, unspecified: Secondary | ICD-10-CM | POA: Diagnosis not present

## 2020-12-03 ENCOUNTER — Telehealth: Payer: Self-pay

## 2020-12-03 NOTE — Telephone Encounter (Signed)
Refill request received from [facility], per pt, she uses [facility] only.

## 2020-12-04 DIAGNOSIS — Z20828 Contact with and (suspected) exposure to other viral communicable diseases: Secondary | ICD-10-CM | POA: Diagnosis not present

## 2020-12-04 DIAGNOSIS — J329 Chronic sinusitis, unspecified: Secondary | ICD-10-CM | POA: Diagnosis not present

## 2020-12-04 DIAGNOSIS — R059 Cough, unspecified: Secondary | ICD-10-CM | POA: Diagnosis not present

## 2020-12-10 ENCOUNTER — Ambulatory Visit: Payer: Medicare Other | Admitting: Nurse Practitioner

## 2020-12-14 DIAGNOSIS — R197 Diarrhea, unspecified: Secondary | ICD-10-CM | POA: Diagnosis not present

## 2020-12-14 DIAGNOSIS — R109 Unspecified abdominal pain: Secondary | ICD-10-CM | POA: Diagnosis not present

## 2021-01-23 DIAGNOSIS — M1000 Idiopathic gout, unspecified site: Secondary | ICD-10-CM | POA: Diagnosis not present

## 2021-01-23 DIAGNOSIS — E7849 Other hyperlipidemia: Secondary | ICD-10-CM | POA: Diagnosis not present

## 2021-01-23 DIAGNOSIS — N183 Chronic kidney disease, stage 3 unspecified: Secondary | ICD-10-CM | POA: Diagnosis not present

## 2021-01-23 DIAGNOSIS — E1122 Type 2 diabetes mellitus with diabetic chronic kidney disease: Secondary | ICD-10-CM | POA: Diagnosis not present

## 2021-01-23 DIAGNOSIS — I129 Hypertensive chronic kidney disease with stage 1 through stage 4 chronic kidney disease, or unspecified chronic kidney disease: Secondary | ICD-10-CM | POA: Diagnosis not present

## 2021-02-10 DIAGNOSIS — E1122 Type 2 diabetes mellitus with diabetic chronic kidney disease: Secondary | ICD-10-CM | POA: Diagnosis not present

## 2021-02-10 DIAGNOSIS — N183 Chronic kidney disease, stage 3 unspecified: Secondary | ICD-10-CM | POA: Diagnosis not present

## 2021-02-10 DIAGNOSIS — E7849 Other hyperlipidemia: Secondary | ICD-10-CM | POA: Diagnosis not present

## 2021-02-10 DIAGNOSIS — I10 Essential (primary) hypertension: Secondary | ICD-10-CM | POA: Diagnosis not present

## 2021-02-10 DIAGNOSIS — E782 Mixed hyperlipidemia: Secondary | ICD-10-CM | POA: Diagnosis not present

## 2021-02-11 ENCOUNTER — Other Ambulatory Visit: Payer: Self-pay

## 2021-02-11 ENCOUNTER — Ambulatory Visit (INDEPENDENT_AMBULATORY_CARE_PROVIDER_SITE_OTHER): Payer: Medicare Other | Admitting: Nurse Practitioner

## 2021-02-11 ENCOUNTER — Encounter: Payer: Self-pay | Admitting: Nurse Practitioner

## 2021-02-11 VITALS — BP 110/68 | HR 54 | Temp 97.2°F | Ht 65.0 in | Wt 169.4 lb

## 2021-02-11 DIAGNOSIS — K219 Gastro-esophageal reflux disease without esophagitis: Secondary | ICD-10-CM | POA: Diagnosis not present

## 2021-02-11 DIAGNOSIS — R1319 Other dysphagia: Secondary | ICD-10-CM | POA: Diagnosis not present

## 2021-02-11 NOTE — Patient Instructions (Signed)
Your health issues we discussed today were:   GERD (reflux/heartburn) and dysphagia (swallowing difficulties): 1. Glad you are doing better! 2. As we discussed, continue taking Protonix once a day for an additional month 3. After that month you can stop taking Protonix 4. If your heartburn/reflux symptoms return you can restart Protonix once a day 5. Call us if you have any worsening or severe symptoms or recurrent swallowing difficulties  Overall I recommend:  1. Continue other current medications 2. Return for follow-up in 6 months 3. Call us for any questions or concerns   ---------------------------------------------------------------  At [facility] we value your feedback. You may receive a survey about your visit today. Please share your experience as we strive to create trusting relationships with our patients to provide genuine, compassionate, quality care.  We appreciate your understanding and patience as we review any laboratory studies, imaging, and other diagnostic tests that are ordered as we care for you. Our office policy is 5 business days for review of these results, and any emergent or urgent results are addressed in a timely manner for your best interest. If you do not hear from our office in 1 week, please contact us.   We also encourage the use of MyChart, which contains your medical information for your review as well. If you are not enrolled in this feature, an access code is on this after visit summary for your convenience. Thank you for allowing [place] to be involved in your care.  It was great to see you today!  I hope you have a safe and warm winter!!    ---------------------------------------------------------------

## 2021-02-11 NOTE — Progress Notes (Signed)
Referring Provider: Caryl Bis, MD Primary Care Physician:  Caryl Bis, MD Primary GI:  Dr. Gala Romney  Chief Complaint  Patient presents with  . Gastroesophageal Reflux    Doing well. Dropped reflux med down to QD. No trouble swallowing    HPI:   Debra Vasquez is a 75 y.o. female who presents for follow-up on GERD and dysphagia.  The patient was last seen in our office 11/11/2020 for the same.  Known history of CHF, GERD, hypertension, hypercholesterolemia, type 2 diabetes.  She was having globus sensation and dysphagia in the ER at [facility] in November 2021.  Cardiac evaluation was negative.  At her last visit noted chronic GERD dysphagia 3 years with no previous EGD currently managed on Protonix 40 mg daily.  Has frequent dysphagia essentially daily with frequent regurgitation.  Daily GERD symptoms.  Colonoscopy completed "years ago" and told no further colonoscopies.  Diabetes appears relatively well managed with recent A1c at 7.0 or less.  CHF with echocardiogram 06/04/2020 demonstrating ejection fraction of 60 to 65%.  Recommended request previous colonoscopy records, increase Protonix to 40 mg twice daily, EGD with possible dilation, dysphagia 3 diet in the interim, follow-up in 3 months.  Today states doing okay overall.  No further dysphagia symptoms.  Her reflux symptoms are doing quite well and she is actually been able to reduce her Protonix down to once daily about 2 weeks ago; no recurrent symptoms. Denies abdominal pain, N/V, hematochezia, melena. Denies URI or flu-like symptoms. Denies loss of sense of taste or smell. The patient has not received COVID-19 vaccination(s). Denies chest pain, dyspnea, dizziness, lightheadedness, syncope, near syncope. Denies any other upper or lower GI symptoms.  Past Medical History:  Diagnosis Date  . Diabetes mellitus without complication ([place])   . Dysphagia   . GERD (gastroesophageal reflux disease)   . Gout   .  Hypertension     Past Surgical History:  Procedure Laterality Date  . ABDOMINAL HYSTERECTOMY    . CARPAL TUNNEL RELEASE Left   . CATARACT EXTRACTION W/PHACO Right 09/13/2019   Procedure: CATARACT EXTRACTION PHACO AND INTRAOCULAR LENS PLACEMENT RIGHT EYE;  Surgeon: Baruch Goldmann, MD;  Location: [facility];  Service: Ophthalmology;  Laterality: Right;  right  . CATARACT EXTRACTION W/PHACO Left 09/27/2019   Procedure: CATARACT EXTRACTION PHACO AND INTRAOCULAR LENS PLACEMENT LEFT EYE  (CDE: 8.04);  Surgeon: Baruch Goldmann, MD;  Location: [facility];  Service: Ophthalmology;  Laterality: Left;  . ESOPHAGOGASTRODUODENOSCOPY N/A 11/18/2020   Procedure: ESOPHAGOGASTRODUODENOSCOPY (EGD);  Surgeon: Daneil Dolin, MD;  Location: [facility];  Service: Endoscopy;  Laterality: N/A;  7:30am  . MALONEY DILATION N/A 11/18/2020   Procedure: Venia Minks DILATION;  Surgeon: Daneil Dolin, MD;  Location: [facility];  Service: Endoscopy;  Laterality: N/A;  . TUBAL LIGATION      Current Outpatient Medications  Medication Sig Dispense Refill  . allopurinol (ZYLOPRIM) 300 MG tablet Take 300 mg by mouth in the morning.     Marland Kitchen aspirin EC 81 MG tablet Take 81 mg by mouth 3 (three) times a week. Swallow whole.    . Calcium Carbonate-Vitamin D (CALCARB 600/D PO) Take 1 tablet by mouth 3 (three) times a week.     . cloNIDine (CATAPRES) 0.3 MG tablet Take 0.3 mg by mouth 2 (two) times daily.     . furosemide (LASIX) 20 MG tablet Take 20 mg by mouth daily as needed for edema.     Marland Kitchen  glipiZIDE (GLUCOTROL XL) 5 MG 24 hr tablet Take 5 mg by mouth in the morning.     Marland Kitchen ibuprofen (ADVIL) 200 MG tablet Take 400-600 mg by mouth every 6 (six) hours as needed for headache or moderate pain.    . magnesium oxide (MAG-OX) 400 MG tablet Take 400 mg by mouth daily.    . metFORMIN (GLUCOPHAGE-XR) 500 MG 24 hr tablet Take 500 mg by mouth at bedtime.     . metoprolol tartrate (LOPRESSOR) 50 MG tablet Take 50 mg by mouth 2 (two) times daily.     . pantoprazole (PROTONIX) 40 MG tablet Take 1 tablet (40 mg total) by mouth 2 (two) times daily before a meal. (Patient taking differently: Take 40 mg by mouth daily.) 60 tablet 3  . rosuvastatin (CRESTOR) 5 MG tablet Take 5 mg by mouth daily.    . valsartan-hydrochlorothiazide (DIOVAN-HCT) 320-12.5 MG tablet Take 1 tablet by mouth daily.    Marland Kitchen VITAMIN E, TOPICAL, CREA Apply 1 application topically daily.     No current facility-administered medications for this visit.    Allergies as of 02/11/2021 - Review Complete 02/11/2021  Allergen Reaction Noted  . Gabapentin Other (See Comments) 12/17/2018  . Capoten [captopril] Cough 09/10/2019  . Celexa [citalopram hydrobromide]  09/10/2019  . Hydrocodone  11/11/2020  . Lexapro [escitalopram oxalate]  09/10/2019  . Lipitor [atorvastatin calcium] Other (See Comments) 09/13/2019  . Tramadol  09/10/2019    Family History  Problem Relation Age of Onset  . Hyperlipidemia Mother   . Parkinson's disease Father   . Hyperlipidemia Sister   . Alcoholism Brother   . Diabetes Brother   . Colon cancer Neg Hx   . Gastric cancer Neg Hx   . Esophageal cancer Neg Hx     Social History   Socioeconomic History  . Marital status: Widowed    Spouse name: Not on file  . Number of children: Not on file  . Years of education: Not on file  . Highest education level: Not on file  Occupational History  . Not on file  Tobacco Use  . Smoking status: Former Smoker    Packs/day: 0.50    Years: 6.00    Pack years: 3.00    Types: Cigarettes    Quit date: 09/09/1985    Years since quitting: 35.4  . Smokeless tobacco: Never Used  Vaping Use  . Vaping Use: Never used  Substance and Sexual Activity  . Alcohol use: Not Currently  . Drug use: Never  . Sexual activity: Not Currently  Other Topics Concern  . Not on file  Social History Narrative  . Not on file   Social Determinants of Health   Financial Resource Strain: Not on file  Food Insecurity:  Not on file  Transportation Needs: Not on file  Physical Activity: Not on file  Stress: Not on file  Social Connections: Not on file    Subjective: Review of Systems  Constitutional: Negative for chills, fever, malaise/fatigue and weight loss.  HENT: Negative for congestion and sore throat.   Respiratory: Negative for cough and shortness of breath.   Cardiovascular: Negative for chest pain and palpitations.  Gastrointestinal: Negative for abdominal pain, blood in stool, diarrhea, heartburn, melena, nausea and vomiting.  Musculoskeletal: Negative for joint pain and myalgias.  Skin: Negative for rash.  Neurological: Negative for dizziness and weakness.  Endo/Heme/Allergies: Does not bruise/bleed easily.  Psychiatric/Behavioral: Negative for depression. The patient is not nervous/anxious.   All other systems  reviewed and are negative.    Objective: BP 110/68   Pulse (!) 54   Temp (!) 97.2 F (36.2 C)   Ht 5\' 5"  (1.651 m)   Wt 169 lb 6.4 oz (76.8 kg)   BMI 28.19 kg/m  Physical Exam Vitals and nursing note reviewed.  Constitutional:      General: She is not in acute distress.    Appearance: Normal appearance. She is well-developed and normal weight. She is not ill-appearing, toxic-appearing or diaphoretic.  HENT:     Head: Normocephalic and atraumatic.     Nose: No congestion or rhinorrhea.  Eyes:     General: No scleral icterus. Cardiovascular:     Rate and Rhythm: Normal rate and regular rhythm.     Heart sounds: Normal heart sounds.  Pulmonary:     Effort: Pulmonary effort is normal. No respiratory distress.     Breath sounds: Normal breath sounds.  Abdominal:     General: Bowel sounds are normal.     Palpations: Abdomen is soft. There is no hepatomegaly, splenomegaly or mass.     Tenderness: There is no abdominal tenderness. There is no guarding or rebound.     Hernia: No hernia is present.  Skin:    General: Skin is warm and dry.     Coloration: Skin is not  jaundiced.     Findings: No rash.  Neurological:     General: No focal deficit present.     Mental Status: She is alert and oriented to person, place, and time.  Psychiatric:        Attention and Perception: Attention normal.        Mood and Affect: Mood normal.        Speech: Speech normal.        Behavior: Behavior normal.        Thought Content: Thought content normal.        Cognition and Memory: Cognition and memory normal.      Assessment:  Pleasant 75 year old female presents for follow-up on GERD and dysphagia.  Globally improved at this time.  No red flag/warning signs or symptoms.  GERD: Symptoms have been doing quite well and she reduced her PPI from twice a day to once daily.  She continues without any breakthrough symptoms.  She was inquiring about stopping the medication altogether.  She just reduce it to once a day 2 weeks ago.  I recommended she continue for 1 month on once daily at which point she can try stopping Protonix.  If she has recurrent symptoms she can restart once a day.  Dysphagia: Status post EGD with empiric dilation and essentially resolution of symptoms.  Question of small submucosal GE junction ring that appeared to have successful disruption after dilation.  Recommend she continue her Protonix for another month described above.  Call for any worsening or recurrent symptoms   Plan: 1. Continue Protonix for another month then stop 2. Restart Protonix if symptoms recur after stopping it 3. Call for any worsening symptoms 4. Follow-up in 6 months    Thank you for allowing [place] to participate in the care of Rochelle, DNP, AGNP-C Adult & Gerontological Nurse Practitioner [facility]   02/11/2021 11:29 AM   Disclaimer: This note was dictated with voice recognition software. Similar sounding words can inadvertently be transcribed and may not be corrected upon review.

## 2021-02-15 DIAGNOSIS — Z1331 Encounter for screening for depression: Secondary | ICD-10-CM | POA: Diagnosis not present

## 2021-02-15 DIAGNOSIS — I10 Essential (primary) hypertension: Secondary | ICD-10-CM | POA: Diagnosis not present

## 2021-02-15 DIAGNOSIS — E7849 Other hyperlipidemia: Secondary | ICD-10-CM | POA: Diagnosis not present

## 2021-02-15 DIAGNOSIS — E1122 Type 2 diabetes mellitus with diabetic chronic kidney disease: Secondary | ICD-10-CM | POA: Diagnosis not present

## 2021-02-15 DIAGNOSIS — Z1389 Encounter for screening for other disorder: Secondary | ICD-10-CM | POA: Diagnosis not present

## 2021-02-15 DIAGNOSIS — E739 Lactose intolerance, unspecified: Secondary | ICD-10-CM | POA: Diagnosis not present

## 2021-02-15 DIAGNOSIS — K7581 Nonalcoholic steatohepatitis (NASH): Secondary | ICD-10-CM | POA: Diagnosis not present

## 2021-02-15 DIAGNOSIS — G43909 Migraine, unspecified, not intractable, without status migrainosus: Secondary | ICD-10-CM | POA: Diagnosis not present

## 2021-02-15 NOTE — Progress Notes (Signed)
Cc'ed to pcp °

## 2021-02-22 DIAGNOSIS — E1122 Type 2 diabetes mellitus with diabetic chronic kidney disease: Secondary | ICD-10-CM | POA: Diagnosis not present

## 2021-02-22 DIAGNOSIS — E7849 Other hyperlipidemia: Secondary | ICD-10-CM | POA: Diagnosis not present

## 2021-02-22 DIAGNOSIS — M1000 Idiopathic gout, unspecified site: Secondary | ICD-10-CM | POA: Diagnosis not present

## 2021-02-22 DIAGNOSIS — N183 Chronic kidney disease, stage 3 unspecified: Secondary | ICD-10-CM | POA: Diagnosis not present

## 2021-02-22 DIAGNOSIS — I129 Hypertensive chronic kidney disease with stage 1 through stage 4 chronic kidney disease, or unspecified chronic kidney disease: Secondary | ICD-10-CM | POA: Diagnosis not present

## 2021-02-27 DIAGNOSIS — Z20828 Contact with and (suspected) exposure to other viral communicable diseases: Secondary | ICD-10-CM | POA: Diagnosis not present

## 2021-02-27 DIAGNOSIS — J329 Chronic sinusitis, unspecified: Secondary | ICD-10-CM | POA: Diagnosis not present

## 2021-03-24 DIAGNOSIS — N183 Chronic kidney disease, stage 3 unspecified: Secondary | ICD-10-CM | POA: Diagnosis not present

## 2021-03-24 DIAGNOSIS — E7849 Other hyperlipidemia: Secondary | ICD-10-CM | POA: Diagnosis not present

## 2021-03-24 DIAGNOSIS — M1000 Idiopathic gout, unspecified site: Secondary | ICD-10-CM | POA: Diagnosis not present

## 2021-03-24 DIAGNOSIS — I129 Hypertensive chronic kidney disease with stage 1 through stage 4 chronic kidney disease, or unspecified chronic kidney disease: Secondary | ICD-10-CM | POA: Diagnosis not present

## 2021-03-24 DIAGNOSIS — E1122 Type 2 diabetes mellitus with diabetic chronic kidney disease: Secondary | ICD-10-CM | POA: Diagnosis not present

## 2021-05-11 ENCOUNTER — Other Ambulatory Visit: Payer: Self-pay | Admitting: Nurse Practitioner

## 2021-05-11 DIAGNOSIS — K219 Gastro-esophageal reflux disease without esophagitis: Secondary | ICD-10-CM

## 2021-05-11 DIAGNOSIS — R1319 Other dysphagia: Secondary | ICD-10-CM

## 2021-05-11 NOTE — Telephone Encounter (Signed)
Can we verify that patient needs Protonix prescription refilled and if she is taking this medication once or twice daily?  She was last seen by Randall Hiss in February 2022.  She was tapering off of Protonix at that time.  He recommended resuming Protonix once daily if needed.

## 2021-05-12 NOTE — Telephone Encounter (Signed)
Spoke with pt.  She said that she is still taking Protonix twice daily as needed.  She said that she probably takes it about 4 days a week twice daily.  She said some days she has to use Rolaids as well.  She said that she tried tapering down but ended up having to take it twice daily as needed.

## 2021-05-12 NOTE — Telephone Encounter (Signed)
Noted.  Rx sent.

## 2021-05-12 NOTE — Telephone Encounter (Signed)
noted 

## 2021-05-14 DIAGNOSIS — I509 Heart failure, unspecified: Secondary | ICD-10-CM | POA: Diagnosis not present

## 2021-05-21 DIAGNOSIS — R5383 Other fatigue: Secondary | ICD-10-CM | POA: Diagnosis not present

## 2021-05-21 DIAGNOSIS — E7849 Other hyperlipidemia: Secondary | ICD-10-CM | POA: Diagnosis not present

## 2021-05-21 DIAGNOSIS — R5382 Chronic fatigue, unspecified: Secondary | ICD-10-CM | POA: Diagnosis not present

## 2021-05-21 DIAGNOSIS — E119 Type 2 diabetes mellitus without complications: Secondary | ICD-10-CM | POA: Diagnosis not present

## 2021-05-21 DIAGNOSIS — E782 Mixed hyperlipidemia: Secondary | ICD-10-CM | POA: Diagnosis not present

## 2021-05-21 DIAGNOSIS — E559 Vitamin D deficiency, unspecified: Secondary | ICD-10-CM | POA: Diagnosis not present

## 2021-05-24 DIAGNOSIS — M1000 Idiopathic gout, unspecified site: Secondary | ICD-10-CM | POA: Diagnosis not present

## 2021-05-24 DIAGNOSIS — I129 Hypertensive chronic kidney disease with stage 1 through stage 4 chronic kidney disease, or unspecified chronic kidney disease: Secondary | ICD-10-CM | POA: Diagnosis not present

## 2021-05-24 DIAGNOSIS — E7849 Other hyperlipidemia: Secondary | ICD-10-CM | POA: Diagnosis not present

## 2021-05-24 DIAGNOSIS — E1122 Type 2 diabetes mellitus with diabetic chronic kidney disease: Secondary | ICD-10-CM | POA: Diagnosis not present

## 2021-05-24 DIAGNOSIS — N183 Chronic kidney disease, stage 3 unspecified: Secondary | ICD-10-CM | POA: Diagnosis not present

## 2021-05-27 DIAGNOSIS — E1122 Type 2 diabetes mellitus with diabetic chronic kidney disease: Secondary | ICD-10-CM | POA: Diagnosis not present

## 2021-05-27 DIAGNOSIS — I10 Essential (primary) hypertension: Secondary | ICD-10-CM | POA: Diagnosis not present

## 2021-05-27 DIAGNOSIS — Z0001 Encounter for general adult medical examination with abnormal findings: Secondary | ICD-10-CM | POA: Diagnosis not present

## 2021-05-27 DIAGNOSIS — K7581 Nonalcoholic steatohepatitis (NASH): Secondary | ICD-10-CM | POA: Diagnosis not present

## 2021-05-27 DIAGNOSIS — Z1212 Encounter for screening for malignant neoplasm of rectum: Secondary | ICD-10-CM | POA: Diagnosis not present

## 2021-05-27 DIAGNOSIS — E1169 Type 2 diabetes mellitus with other specified complication: Secondary | ICD-10-CM | POA: Diagnosis not present

## 2021-05-27 DIAGNOSIS — E7849 Other hyperlipidemia: Secondary | ICD-10-CM | POA: Diagnosis not present

## 2021-05-27 DIAGNOSIS — E739 Lactose intolerance, unspecified: Secondary | ICD-10-CM | POA: Diagnosis not present

## 2021-07-07 DIAGNOSIS — Z20822 Contact with and (suspected) exposure to covid-19: Secondary | ICD-10-CM | POA: Diagnosis not present

## 2021-07-08 DIAGNOSIS — Z6827 Body mass index (BMI) 27.0-27.9, adult: Secondary | ICD-10-CM | POA: Diagnosis not present

## 2021-07-08 DIAGNOSIS — J0101 Acute recurrent maxillary sinusitis: Secondary | ICD-10-CM | POA: Diagnosis not present

## 2021-07-25 DIAGNOSIS — E1122 Type 2 diabetes mellitus with diabetic chronic kidney disease: Secondary | ICD-10-CM | POA: Diagnosis not present

## 2021-07-25 DIAGNOSIS — E7849 Other hyperlipidemia: Secondary | ICD-10-CM | POA: Diagnosis not present

## 2021-07-25 DIAGNOSIS — N183 Chronic kidney disease, stage 3 unspecified: Secondary | ICD-10-CM | POA: Diagnosis not present

## 2021-07-25 DIAGNOSIS — M1000 Idiopathic gout, unspecified site: Secondary | ICD-10-CM | POA: Diagnosis not present

## 2021-07-25 DIAGNOSIS — I129 Hypertensive chronic kidney disease with stage 1 through stage 4 chronic kidney disease, or unspecified chronic kidney disease: Secondary | ICD-10-CM | POA: Diagnosis not present

## 2021-07-28 DIAGNOSIS — Z20822 Contact with and (suspected) exposure to covid-19: Secondary | ICD-10-CM | POA: Diagnosis not present

## 2021-08-10 DIAGNOSIS — R0981 Nasal congestion: Secondary | ICD-10-CM | POA: Diagnosis not present

## 2021-08-10 DIAGNOSIS — Z87891 Personal history of nicotine dependence: Secondary | ICD-10-CM | POA: Diagnosis not present

## 2021-08-10 DIAGNOSIS — I10 Essential (primary) hypertension: Secondary | ICD-10-CM | POA: Diagnosis not present

## 2021-08-10 DIAGNOSIS — E119 Type 2 diabetes mellitus without complications: Secondary | ICD-10-CM | POA: Diagnosis not present

## 2021-08-10 DIAGNOSIS — J3489 Other specified disorders of nose and nasal sinuses: Secondary | ICD-10-CM | POA: Diagnosis not present

## 2021-08-10 DIAGNOSIS — J019 Acute sinusitis, unspecified: Secondary | ICD-10-CM | POA: Diagnosis not present

## 2021-08-10 DIAGNOSIS — R220 Localized swelling, mass and lump, head: Secondary | ICD-10-CM | POA: Diagnosis not present

## 2021-08-10 DIAGNOSIS — R519 Headache, unspecified: Secondary | ICD-10-CM | POA: Diagnosis not present

## 2021-08-10 DIAGNOSIS — J329 Chronic sinusitis, unspecified: Secondary | ICD-10-CM | POA: Diagnosis not present

## 2021-08-12 ENCOUNTER — Other Ambulatory Visit: Payer: Self-pay

## 2021-08-12 ENCOUNTER — Emergency Department (HOSPITAL_COMMUNITY): Payer: Medicare Other

## 2021-08-12 ENCOUNTER — Emergency Department (HOSPITAL_COMMUNITY)
Admission: EM | Admit: 2021-08-12 | Discharge: 2021-08-12 | Disposition: A | Discharge status: Home / Self Care | Payer: Medicare Other | Attending: Emergency Medicine | Admitting: Emergency Medicine

## 2021-08-12 DIAGNOSIS — E119 Type 2 diabetes mellitus without complications: Secondary | ICD-10-CM | POA: Diagnosis not present

## 2021-08-12 DIAGNOSIS — Z79899 Other long term (current) drug therapy: Secondary | ICD-10-CM | POA: Insufficient documentation

## 2021-08-12 DIAGNOSIS — Z87891 Personal history of nicotine dependence: Secondary | ICD-10-CM | POA: Insufficient documentation

## 2021-08-12 DIAGNOSIS — Z7984 Long term (current) use of oral hypoglycemic drugs: Secondary | ICD-10-CM | POA: Diagnosis not present

## 2021-08-12 DIAGNOSIS — R001 Bradycardia, unspecified: Secondary | ICD-10-CM | POA: Diagnosis not present

## 2021-08-12 DIAGNOSIS — R079 Chest pain, unspecified: Secondary | ICD-10-CM

## 2021-08-12 DIAGNOSIS — I10 Essential (primary) hypertension: Secondary | ICD-10-CM | POA: Diagnosis not present

## 2021-08-12 DIAGNOSIS — R0789 Other chest pain: Secondary | ICD-10-CM | POA: Diagnosis not present

## 2021-08-12 DIAGNOSIS — Z7982 Long term (current) use of aspirin: Secondary | ICD-10-CM | POA: Insufficient documentation

## 2021-08-12 DIAGNOSIS — R072 Precordial pain: Secondary | ICD-10-CM | POA: Diagnosis present

## 2021-08-12 DIAGNOSIS — R0602 Shortness of breath: Secondary | ICD-10-CM | POA: Diagnosis not present

## 2021-08-12 LAB — CBC
HCT: 38.2 % (ref 36.0–46.0)
Hemoglobin: 12.7 g/dL (ref 12.0–15.0)
MCH: 29.3 pg (ref 26.0–34.0)
MCHC: 33.2 g/dL (ref 30.0–36.0)
MCV: 88 fL (ref 80.0–100.0)
Platelets: 169 10*3/uL (ref 150–400)
RBC: 4.34 MIL/uL (ref 3.87–5.11)
RDW: 14.7 % (ref 11.5–15.5)
WBC: 11.8 10*3/uL — ABNORMAL HIGH (ref 4.0–10.5)
nRBC: 0 % (ref 0.0–0.2)

## 2021-08-12 LAB — BASIC METABOLIC PANEL
Anion gap: 9 (ref 5–15)
BUN: 25 mg/dL — ABNORMAL HIGH (ref 8–23)
CO2: 28 mmol/L (ref 22–32)
Calcium: 9 mg/dL (ref 8.9–10.3)
Chloride: 98 mmol/L (ref 98–111)
Creatinine, Ser: 1 mg/dL (ref 0.44–1.00)
GFR, Estimated: 59 mL/min — ABNORMAL LOW (ref >60)
Glucose, Bld: 157 mg/dL — ABNORMAL HIGH (ref 70–99)
Potassium: 4.5 mmol/L (ref 3.5–5.1)
Sodium: 135 mmol/L (ref 135–145)

## 2021-08-12 LAB — TROPONIN I (HIGH SENSITIVITY)
Troponin I (High Sensitivity): 6 ng/L (ref <18)
Troponin I (High Sensitivity): 7 ng/L (ref <18)

## 2021-08-12 MED ORDER — HYDRALAZINE HCL 20 MG/ML IJ SOLN
5.0000 mg | Freq: Once | INTRAMUSCULAR | Status: DC
  Filled 2021-08-12: qty 1

## 2021-08-12 NOTE — ED Provider Notes (Signed)
[facility] EMERGENCY DEPARTMENT Provider Note   CSN: [id number] Arrival date & time: 08/12/21  0022     History Chief Complaint  Patient presents with   Chest Pain    Debra Vasquez is a 75 y.o. female.  CP with HTN. Possibly related but both improved here.   The history is provided by the patient.  Chest Pain Pain location:  Substernal area Pain quality: aching and sharp   Pain severity:  Mild Onset quality:  Gradual Timing:  Constant Chronicity:  New Context: not breathing   Relieved by:  None tried Worsened by:  Nothing     Past Medical History:  Diagnosis Date   Diabetes mellitus without complication ([place])    Dysphagia    GERD (gastroesophageal reflux disease)    Gout    Hypertension     Patient Active Problem List   Diagnosis Date Noted   GERD (gastroesophageal reflux disease) 11/11/2020   Dysphagia 11/11/2020    Past Surgical History:  Procedure Laterality Date   ABDOMINAL HYSTERECTOMY     CARPAL TUNNEL RELEASE Left    CATARACT EXTRACTION W/PHACO Right 09/13/2019   Procedure: CATARACT EXTRACTION PHACO AND INTRAOCULAR LENS PLACEMENT RIGHT EYE;  Surgeon: Baruch Goldmann, MD;  Location: [facility];  Service: Ophthalmology;  Laterality: Right;  right   CATARACT EXTRACTION W/PHACO Left 09/27/2019   Procedure: CATARACT EXTRACTION PHACO AND INTRAOCULAR LENS PLACEMENT LEFT EYE  (CDE: 8.04);  Surgeon: Baruch Goldmann, MD;  Location: [facility];  Service: Ophthalmology;  Laterality: Left;   ESOPHAGOGASTRODUODENOSCOPY N/A 11/18/2020   Procedure: ESOPHAGOGASTRODUODENOSCOPY (EGD);  Surgeon: Daneil Dolin, MD;  Location: [facility];  Service: Endoscopy;  Laterality: N/A;  7:30am   MALONEY DILATION N/A 11/18/2020   Procedure: Venia Minks DILATION;  Surgeon: Daneil Dolin, MD;  Location: [facility];  Service: Endoscopy;  Laterality: N/A;   TUBAL LIGATION       OB History   No obstetric history on file.     Family History  Problem Relation Age of Onset    Hyperlipidemia Mother    Parkinson's disease Father    Hyperlipidemia Sister    Alcoholism Brother    Diabetes Brother    Colon cancer Neg Hx    Gastric cancer Neg Hx    Esophageal cancer Neg Hx     Social History   Tobacco Use   Smoking status: Former    Packs/day: 0.50    Years: 6.00    Pack years: 3.00    Types: Cigarettes    Quit date: 09/09/1985    Years since quitting: 35.9   Smokeless tobacco: Never  Vaping Use   Vaping Use: Never used  Substance Use Topics   Alcohol use: Not Currently   Drug use: Never    Home Medications Prior to Admission medications   Medication Sig Start Date End Date Taking? Authorizing Provider  allopurinol (ZYLOPRIM) 300 MG tablet Take 300 mg by mouth in the morning.     [provider]  aspirin EC 81 MG tablet Take 81 mg by mouth 3 (three) times a week. Swallow whole.    [provider]  Calcium Carbonate-Vitamin D (CALCARB 600/D PO) Take 1 tablet by mouth 3 (three) times a week.     [provider]  cloNIDine (CATAPRES) 0.3 MG tablet Take 0.3 mg by mouth 2 (two) times daily.  07/06/19   [provider]  furosemide (LASIX) 20 MG tablet Take 20 mg by mouth daily as needed for  edema.     [provider]  glipiZIDE (GLUCOTROL XL) 5 MG 24 hr tablet Take 5 mg by mouth in the morning.     [provider]  ibuprofen (ADVIL) 200 MG tablet Take 400-600 mg by mouth every 6 (six) hours as needed for headache or moderate pain.    [provider]  magnesium oxide (MAG-OX) 400 MG tablet Take 400 mg by mouth daily.    [provider]  metFORMIN (GLUCOPHAGE-XR) 500 MG 24 hr tablet Take 500 mg by mouth at bedtime.  06/25/19   [provider]  metoprolol tartrate (LOPRESSOR) 50 MG tablet Take 50 mg by mouth 2 (two) times daily.    [provider]  pantoprazole (PROTONIX) 40 MG tablet TAKE 1 TABLET(40 MG) BY MOUTH TWICE DAILY BEFORE A MEAL 05/12/21   Aliene Altes S, PA-C   rosuvastatin (CRESTOR) 5 MG tablet Take 5 mg by mouth daily. 07/08/19   [provider]  valsartan-hydrochlorothiazide (DIOVAN-HCT) 320-12.5 MG tablet Take 1 tablet by mouth daily.    [provider]  VITAMIN E, TOPICAL, CREA Apply 1 application topically daily.    [provider]    Allergies    Gabapentin, Capoten [captopril], Celexa [citalopram hydrobromide], Hydrocodone, Lexapro [escitalopram oxalate], Lipitor [atorvastatin calcium], and Tramadol  Review of Systems   Review of Systems  Cardiovascular:  Positive for chest pain.  All other systems reviewed and are negative.  Physical Exam Updated Vital Signs BP (!) 158/69   Pulse (!) 55   Temp 97.7 F (36.5 C) (Oral)   Resp 19   Ht '5\' 5"'$  (1.651 m)   Wt 73.5 kg   SpO2 100%   BMI 26.96 kg/m   Physical Exam Vitals and nursing note reviewed.  Constitutional:      Appearance: She is well-developed.  HENT:     Head: Normocephalic and atraumatic.  Cardiovascular:     Rate and Rhythm: Normal rate and regular rhythm.  Pulmonary:     Effort: No respiratory distress.     Breath sounds: No stridor. No decreased breath sounds or rales.  Chest:     Chest wall: No mass or deformity.  Abdominal:     General: There is no distension.  Musculoskeletal:        General: Normal range of motion.     Cervical back: Normal range of motion.  Skin:    General: Skin is warm and dry.  Neurological:     General: No focal deficit present.     Mental Status: She is alert.     Cranial Nerves: No cranial nerve deficit.     Motor: No weakness.    ED Results / Procedures / Treatments   Labs (all labs ordered are listed, but only abnormal results are displayed) Labs Reviewed  BASIC METABOLIC PANEL - Abnormal; Notable for the following components:      Result Value   Glucose, Bld 157 (*)    BUN 25 (*)    GFR, Estimated 59 (*)    All other components within normal limits  CBC - Abnormal; Notable for the following  components:   WBC 11.8 (*)    All other components within normal limits  TROPONIN I (HIGH SENSITIVITY)  TROPONIN I (HIGH SENSITIVITY)    EKG EKG Interpretation  Date/Time:  Thursday August 12 2021 00:36:04 EDT Ventricular Rate:  57 PR Interval:  148 QRS Duration: 88 QT Interval:  436 QTC Calculation: 424 R Axis:   37 Text  Interpretation: Sinus bradycardia Nonspecific ST and T wave abnormality Abnormal ECG Confirmed by Merrily Pew [phone]) on 08/12/2021 2:39:02 AM  Radiology DG Chest 2 View  Result Date: 08/12/2021 CLINICAL DATA:  Central chest pain and shortness of breath for 1 day, initial encounter EXAM: CHEST - 2 VIEW COMPARISON:  11/09/2020 FINDINGS: Cardiac shadow is stable. Aortic calcifications are noted. Lungs are clear. Mild basilar scarring is noted on the right as well as elevation of the right hemidiaphragm. The overall appearance is stable from the prior exam. No bony abnormality is noted. IMPRESSION: No acute abnormality noted. Electronically Signed   By: Inez Catalina M.D.   On: 08/12/2021 01:09    Procedures Procedures   Medications Ordered in ED Medications  hydrALAZINE (APRESOLINE) injection 5 mg (0 mg Intravenous Hold 08/12/21 0409)    ED Course  I have reviewed the triage vital signs and the nursing notes.  Pertinent labs & imaging results that were available during my care of the patient were reviewed by me and considered in my medical decision making (see chart for details).    MDM Rules/Calculators/A&P                         Negative ECG/troponins. Unlikely ACS/pulmonary edema. Improved BP without intervention. Stable for discharge.   Final Clinical Impression(s) / ED Diagnoses Final diagnoses:  Nonspecific chest pain  Hypertension, unspecified type    Rx / DC Orders ED Discharge Orders     None        Eloni Darius, Corene Cornea, MD 08/12/21 [phone]

## 2021-08-12 NOTE — ED Triage Notes (Signed)
Cc of central chest pain and elevated bp since 9:30 tonight.

## 2021-09-07 ENCOUNTER — Encounter: Payer: Self-pay | Admitting: Gastroenterology

## 2021-09-07 ENCOUNTER — Other Ambulatory Visit: Payer: Self-pay

## 2021-09-07 ENCOUNTER — Ambulatory Visit: Payer: Medicare Other | Admitting: Nurse Practitioner

## 2021-09-07 ENCOUNTER — Ambulatory Visit (INDEPENDENT_AMBULATORY_CARE_PROVIDER_SITE_OTHER): Payer: Medicare Other | Admitting: Gastroenterology

## 2021-09-07 VITALS — BP 103/61 | HR 56 | Temp 96.9°F | Ht 65.0 in | Wt 164.6 lb

## 2021-09-07 DIAGNOSIS — K219 Gastro-esophageal reflux disease without esophagitis: Secondary | ICD-10-CM

## 2021-09-07 DIAGNOSIS — R748 Abnormal levels of other serum enzymes: Secondary | ICD-10-CM

## 2021-09-07 DIAGNOSIS — R945 Abnormal results of liver function studies: Secondary | ICD-10-CM | POA: Diagnosis not present

## 2021-09-07 DIAGNOSIS — Z1231 Encounter for screening mammogram for malignant neoplasm of breast: Secondary | ICD-10-CM | POA: Diagnosis not present

## 2021-09-07 DIAGNOSIS — R7989 Other specified abnormal findings of blood chemistry: Secondary | ICD-10-CM

## 2021-09-07 NOTE — Patient Instructions (Signed)
Take pantoprazole once daily as needed for acid reflux.  We will get a copy of your labs from PCP for review regarding your liver and pancreas blood work. Further recommendations to follow.

## 2021-09-07 NOTE — Progress Notes (Signed)
Primary Care Physician: Caryl Bis, MD  Primary Gastroenterologist:  Garfield Cornea, MD   Chief Complaint  Patient presents with   Gastroesophageal Reflux    Was having a lot of reflux, restarted Protonix, since better she stopped protonix    HPI: Debra Vasquez is a 75 y.o. female here for follow-up of GERD and dysphagia.  Last seen in February.  Reflux ok right now. Was off pantoprazole for couple of weeks and then symptoms.  Flaring.  Restarted pantoprazole initially 1 daily then up to twice daily.  Symptoms resolved so stop pantoprazole again.  Currently doing fine.  Bowel movements regular.  Metformin causes some upset if does two pills per day.  No melena or rectal bleeding.  No dysphagia.  Patient seen in the ED at [facility] October 2021 for chest pain.  At that time her AST was mildly elevated at 48, alkaline phosphatase mildly elevated at 126, lipase elevated at 209.  November 2021: AST 46, ALT 117.  July 2021: AST 53.   EGD November 2021: - Normal esophagus. Dilated. Query occult mucosal ring at the GE junction?disrupted with dilation - Small hiatal hernia. - Normal duodenal bulb and second portion of the duodenum. - No specimens collected.  Colonoscopy February 2015: Dr. Ladona Horns -Diverticula in the sigmoid colon -Next colonoscopy February 2025  Current Outpatient Medications  Medication Sig Dispense Refill   allopurinol (ZYLOPRIM) 300 MG tablet Take 300 mg by mouth in the morning.      aspirin EC 81 MG tablet Take 81 mg by mouth 3 (three) times a week. Swallow whole.     Calcium Carbonate-Vitamin D (CALCARB 600/D PO) Take 1 tablet by mouth 3 (three) times a week.      cloNIDine (CATAPRES) 0.3 MG tablet Take 0.3 mg by mouth 2 (two) times daily.      furosemide (LASIX) 20 MG tablet Take 20 mg by mouth daily as needed for edema.      glipiZIDE (GLUCOTROL XL) 5 MG 24 hr tablet Take 5 mg by mouth in the morning.      magnesium oxide (MAG-OX) 400 MG  tablet Take 400 mg by mouth daily.     metFORMIN (GLUCOPHAGE-XR) 500 MG 24 hr tablet Take 500 mg by mouth at bedtime.      metoprolol tartrate (LOPRESSOR) 50 MG tablet Take 50 mg by mouth 2 (two) times daily.     rosuvastatin (CRESTOR) 5 MG tablet Take 5 mg by mouth daily.     valsartan-hydrochlorothiazide (DIOVAN-HCT) 320-12.5 MG tablet Take 1 tablet by mouth daily.     VITAMIN E, TOPICAL, CREA Apply 1 application topically daily.     ibuprofen (ADVIL) 200 MG tablet Take 400-600 mg by mouth every 6 (six) hours as needed for headache or moderate pain. (Patient not taking: Reported on 09/07/2021)     pantoprazole (PROTONIX) 40 MG tablet TAKE 1 TABLET(40 MG) BY MOUTH TWICE DAILY BEFORE A MEAL (Patient not taking: Reported on 09/07/2021) 60 tablet 5   No current facility-administered medications for this visit.    Allergies as of 09/07/2021 - Review Complete 09/07/2021  Allergen Reaction Noted   Gabapentin Other (See Comments) 12/17/2018   Capoten [captopril] Cough 09/10/2019   Celexa [citalopram hydrobromide]  09/10/2019   Hydrocodone  11/11/2020   Lexapro [escitalopram oxalate]  09/10/2019   Lipitor [atorvastatin calcium] Other (See Comments) 09/13/2019   Tramadol  09/10/2019    ROS:  General: Negative for anorexia, weight loss, fever, chills,  fatigue, weakness. ENT: Negative for hoarseness, difficulty swallowing , nasal congestion. CV: Negative for chest pain, angina, palpitations, dyspnea on exertion, peripheral edema.  Respiratory: Negative for dyspnea at rest, dyspnea on exertion, cough, sputum, wheezing.  GI: See history of present illness. GU:  Negative for dysuria, hematuria, urinary incontinence, urinary frequency, nocturnal urination.  Endo: Negative for unusual weight change.    Physical Examination:   BP 103/61   Pulse (!) 56   Temp (!) 96.9 F (36.1 C)   Ht '5\' 5"'$  (1.651 m)   Wt 164 lb 9.6 oz (74.7 kg)   BMI 27.39 kg/m   General: Well-nourished, well-developed in  no acute distress.  Eyes: No icterus. Mouth: masked Lungs: Clear to auscultation bilaterally.  Heart: Regular rate and rhythm, no murmurs rubs or gallops.  Abdomen: Bowel sounds are normal, nontender, nondistended, no hepatosplenomegaly or masses, no abdominal bruits or hernia , no rebound or guarding.   Extremities: No lower extremity edema. No clubbing or deformities. Neuro: Alert and oriented x 4   Skin: Warm and dry, no jaundice.   Psych: Alert and cooperative, normal mood and affect.  Labs:  Lab Results  Component Value Date   WBC 11.8 (H) 08/12/2021   HGB 12.7 08/12/2021   HCT 38.2 08/12/2021   MCV 88.0 08/12/2021   PLT 169 08/12/2021   Lab Results  Component Value Date   CREATININE 1.00 08/12/2021   BUN 25 (H) 08/12/2021   NA 135 08/12/2021   K 4.5 08/12/2021   CL 98 08/12/2021   CO2 28 08/12/2021     No results found for: ALT, AST, GGT, ALKPHOS, BILITOT No results found for: LIPASE   Imaging Studies: DG Chest 2 View  Result Date: 08/12/2021 CLINICAL DATA:  Central chest pain and shortness of breath for 1 day, initial encounter EXAM: CHEST - 2 VIEW COMPARISON:  11/09/2020 FINDINGS: Cardiac shadow is stable. Aortic calcifications are noted. Lungs are clear. Mild basilar scarring is noted on the right as well as elevation of the right hemidiaphragm. The overall appearance is stable from the prior exam. No bony abnormality is noted. IMPRESSION: No acute abnormality noted. Electronically Signed   By: Inez Catalina M.D.   On: 08/12/2021 01:09     Assessment:  GERD: Flare of reflux when she stopped pantoprazole before.  Went back on twice daily dosing with resolution of her symptoms.  Subsequently has stopped medication again.  Encouraged her to follow antireflux measures.  She can use pantoprazole once daily as needed for reflux.  S  Elevated LFTs, lipase: Noted while in the ED October 2021 for chest pain.  At that time her AST was mildly elevated at 48, alkaline  phosphatase mildly elevated at 126, lipase elevated at 209.  November 2021: AST 46, ALT 117.  July 2021: AST 53.  She believes her labs were rechecked by her PCP.  We have requested records.  Plan: Continue pantoprazole daily as needed. Retrieve copy of labs from PCP. Colonoscopy due February 2025.  Patient may not be interested, can reassess.

## 2021-09-15 ENCOUNTER — Other Ambulatory Visit: Payer: Self-pay | Admitting: Gastroenterology

## 2021-09-15 ENCOUNTER — Telehealth: Payer: Self-pay | Admitting: Gastroenterology

## 2021-09-15 DIAGNOSIS — R7989 Other specified abnormal findings of blood chemistry: Secondary | ICD-10-CM

## 2021-09-15 DIAGNOSIS — R945 Abnormal results of liver function studies: Secondary | ICD-10-CM

## 2021-09-15 DIAGNOSIS — R748 Abnormal levels of other serum enzymes: Secondary | ICD-10-CM

## 2021-09-15 NOTE — Telephone Encounter (Signed)
Pt was made aware and verbalized understanding. Pt is scheduled to have labs drawn at pcp's office on 09/24/2021. Faxed over recommended labs to pcp's office to have drawn on 09/24/2021 as well. Forwarded to clinical pool to schedule abd U/S as recommended.

## 2021-09-15 NOTE — Telephone Encounter (Signed)
Labs received from PCP dated 05/21/2021: White blood cell count 9700, hemoglobin 13.1, platelets 182,000, creatinine 1.22, albumin 4.3, total bilirubin 0.8, alkaline phosphatase 119, AST 54, ALT 34, A1c 7.9.  Labs dated 10/19/2020: Total bilirubin 0.7, alkaline phosphatase 97, AST 56, ALT 37.   Please let pt know that I review labs from PCP. Dated 04/2021, 09/2020.  LFTS chronically elevated. No prior liver imaging. Previous pancreas labs elevated.   Recommend:  -labs for LFTs, Hep B surface antigen, Hep C antibody, iron/tibc/ferritin, lipase, IgG/IgA/IgM, ANA, ASMA, AMA -Abd U/S: Dx elevated LFTs, lipase

## 2021-09-15 NOTE — Addendum Note (Signed)
Addended by: Zara Council C on: 09/15/2021 11:54 AM   Modules accepted: Orders

## 2021-09-15 NOTE — Telephone Encounter (Signed)
[place] abd scheduled for 09/23/21 at 8:30am, arrive at 8:15am. NPO after midnight before test.   Tried to call pt, left detailed message on VM to inform her of [place] appt. Appt letter mailed.

## 2021-09-23 ENCOUNTER — Ambulatory Visit (HOSPITAL_COMMUNITY)
Admission: RE | Admit: 2021-09-23 | Discharge: 2021-09-23 | Disposition: A | Discharge status: Home / Self Care | Payer: Medicare Other | Source: Ambulatory Visit | Attending: Gastroenterology | Admitting: Gastroenterology

## 2021-09-23 ENCOUNTER — Other Ambulatory Visit: Payer: Self-pay

## 2021-09-23 DIAGNOSIS — R7989 Other specified abnormal findings of blood chemistry: Secondary | ICD-10-CM | POA: Diagnosis not present

## 2021-09-23 DIAGNOSIS — R945 Abnormal results of liver function studies: Secondary | ICD-10-CM | POA: Diagnosis not present

## 2021-09-23 DIAGNOSIS — R748 Abnormal levels of other serum enzymes: Secondary | ICD-10-CM | POA: Diagnosis not present

## 2021-09-23 DIAGNOSIS — N281 Cyst of kidney, acquired: Secondary | ICD-10-CM | POA: Diagnosis not present

## 2021-09-24 ENCOUNTER — Telehealth: Payer: Self-pay

## 2021-09-24 DIAGNOSIS — E7849 Other hyperlipidemia: Secondary | ICD-10-CM | POA: Diagnosis not present

## 2021-09-24 DIAGNOSIS — E1122 Type 2 diabetes mellitus with diabetic chronic kidney disease: Secondary | ICD-10-CM | POA: Diagnosis not present

## 2021-09-24 DIAGNOSIS — E782 Mixed hyperlipidemia: Secondary | ICD-10-CM | POA: Diagnosis not present

## 2021-09-24 DIAGNOSIS — N183 Chronic kidney disease, stage 3 unspecified: Secondary | ICD-10-CM | POA: Diagnosis not present

## 2021-09-24 DIAGNOSIS — E1165 Type 2 diabetes mellitus with hyperglycemia: Secondary | ICD-10-CM | POA: Diagnosis not present

## 2021-09-24 DIAGNOSIS — I10 Essential (primary) hypertension: Secondary | ICD-10-CM | POA: Diagnosis not present

## 2021-09-24 NOTE — Telephone Encounter (Signed)
[facility] phoned to advise that the radiologist is recommending an MRI with and without contrast on this pt after looking at abdomen U/S.

## 2021-09-24 NOTE — Telephone Encounter (Signed)
noted 

## 2021-09-24 NOTE — Telephone Encounter (Signed)
Noted. Will address under result note.

## 2021-09-27 ENCOUNTER — Other Ambulatory Visit: Payer: Self-pay | Admitting: *Deleted

## 2021-09-27 DIAGNOSIS — R9389 Abnormal findings on diagnostic imaging of other specified body structures: Secondary | ICD-10-CM

## 2021-09-30 ENCOUNTER — Telehealth: Payer: Self-pay | Admitting: Gastroenterology

## 2021-09-30 DIAGNOSIS — M1000 Idiopathic gout, unspecified site: Secondary | ICD-10-CM | POA: Diagnosis not present

## 2021-09-30 DIAGNOSIS — I10 Essential (primary) hypertension: Secondary | ICD-10-CM | POA: Diagnosis not present

## 2021-09-30 DIAGNOSIS — Z23 Encounter for immunization: Secondary | ICD-10-CM | POA: Diagnosis not present

## 2021-09-30 DIAGNOSIS — G43909 Migraine, unspecified, not intractable, without status migrainosus: Secondary | ICD-10-CM | POA: Diagnosis not present

## 2021-09-30 DIAGNOSIS — K7581 Nonalcoholic steatohepatitis (NASH): Secondary | ICD-10-CM | POA: Diagnosis not present

## 2021-09-30 DIAGNOSIS — E1122 Type 2 diabetes mellitus with diabetic chronic kidney disease: Secondary | ICD-10-CM | POA: Diagnosis not present

## 2021-09-30 DIAGNOSIS — E7849 Other hyperlipidemia: Secondary | ICD-10-CM | POA: Diagnosis not present

## 2021-09-30 DIAGNOSIS — E739 Lactose intolerance, unspecified: Secondary | ICD-10-CM | POA: Diagnosis not present

## 2021-09-30 NOTE — Telephone Encounter (Signed)
Recent labs dated 09/24/2021 received from PCP in basket in [place] office for review.

## 2021-10-04 NOTE — Telephone Encounter (Signed)
Not unless they were done and staff just didn't send copy of our labs too. What I received was LFTs, Lipid panel.

## 2021-10-04 NOTE — Telephone Encounter (Signed)
Contacted PCP and they in fact were not done even though they was sent to them and they confirmed to me that they were received. I spoke with the pt and informed her that she would need to go have them done. Pt is scheduled for an MRI at [facility] on Thursday and states that she will go afterwards to the lab to have them drawn.

## 2021-10-04 NOTE — Telephone Encounter (Signed)
See result note, my labs did not get done. Labs received from PCP had none of the labs I requested for 09/15/21. She needs to have my labs completed for further work up of abnormal liver labs.

## 2021-10-04 NOTE — Telephone Encounter (Signed)
Thanks

## 2021-10-07 ENCOUNTER — Other Ambulatory Visit: Payer: Self-pay

## 2021-10-07 ENCOUNTER — Ambulatory Visit (HOSPITAL_COMMUNITY)
Admission: RE | Admit: 2021-10-07 | Discharge: 2021-10-07 | Disposition: A | Discharge status: Home / Self Care | Payer: Medicare Other | Source: Ambulatory Visit | Attending: Gastroenterology | Admitting: Gastroenterology

## 2021-10-07 DIAGNOSIS — R945 Abnormal results of liver function studies: Secondary | ICD-10-CM | POA: Diagnosis not present

## 2021-10-07 DIAGNOSIS — N2889 Other specified disorders of kidney and ureter: Secondary | ICD-10-CM | POA: Diagnosis not present

## 2021-10-07 DIAGNOSIS — R748 Abnormal levels of other serum enzymes: Secondary | ICD-10-CM | POA: Diagnosis not present

## 2021-10-07 DIAGNOSIS — R9389 Abnormal findings on diagnostic imaging of other specified body structures: Secondary | ICD-10-CM | POA: Insufficient documentation

## 2021-10-07 MED ORDER — GADOBUTROL 1 MMOL/ML IV SOLN
7.0000 mL | Freq: Once | INTRAVENOUS | Status: AC | PRN
  Administered 2021-10-07: 7 mL via INTRAVENOUS

## 2021-10-12 ENCOUNTER — Other Ambulatory Visit: Payer: Self-pay | Admitting: *Deleted

## 2021-10-12 DIAGNOSIS — R9389 Abnormal findings on diagnostic imaging of other specified body structures: Secondary | ICD-10-CM

## 2021-10-12 LAB — ANA: ANA Titer 1: NEGATIVE

## 2021-10-12 LAB — IRON,TIBC AND FERRITIN PANEL
Ferritin: 77 ng/mL (ref 15–150)
Iron Saturation: 19 % (ref 15–55)
Iron: 71 ug/dL (ref 27–139)
Total Iron Binding Capacity: 366 ug/dL (ref 250–450)
UIBC: 295 ug/dL (ref 118–369)

## 2021-10-12 LAB — HEPATIC FUNCTION PANEL
ALT: 36 IU/L — ABNORMAL HIGH (ref 0–32)
AST: 52 IU/L — ABNORMAL HIGH (ref 0–40)
Albumin: 4.4 g/dL (ref 3.7–4.7)
Alkaline Phosphatase: 120 IU/L (ref 44–121)
Bilirubin Total: 0.7 mg/dL (ref 0.0–1.2)
Bilirubin, Direct: 0.25 mg/dL (ref 0.00–0.40)
Total Protein: 6.7 g/dL (ref 6.0–8.5)

## 2021-10-12 LAB — IGG, IGA, IGM
IgA/Immunoglobulin A, Serum: 185 mg/dL (ref 64–422)
IgG (Immunoglobin G), Serum: 908 mg/dL (ref 586–1602)
IgM (Immunoglobulin M), Srm: 33 mg/dL (ref 26–217)

## 2021-10-12 LAB — HEPATITIS B SURFACE ANTIGEN: Hepatitis B Surface Ag: NEGATIVE

## 2021-10-12 LAB — ANTI-SMOOTH MUSCLE ANTIBODY, IGG: Smooth Muscle Ab: 8 Units (ref 0–19)

## 2021-10-12 LAB — HEPATITIS C ANTIBODY: Hep C Virus Ab: <0.1 s/co ratio (ref 0.0–0.9)

## 2021-10-12 LAB — LIPASE: Lipase: 47 U/L (ref 14–85)

## 2021-10-12 LAB — MITOCHONDRIAL ANTIBODIES: Mitochondrial Ab: <20 Units (ref 0.0–20.0)

## 2021-10-13 DIAGNOSIS — M1000 Idiopathic gout, unspecified site: Secondary | ICD-10-CM | POA: Diagnosis not present

## 2021-10-13 DIAGNOSIS — E1122 Type 2 diabetes mellitus with diabetic chronic kidney disease: Secondary | ICD-10-CM | POA: Diagnosis not present

## 2021-10-13 DIAGNOSIS — K7581 Nonalcoholic steatohepatitis (NASH): Secondary | ICD-10-CM | POA: Diagnosis not present

## 2021-10-13 DIAGNOSIS — I10 Essential (primary) hypertension: Secondary | ICD-10-CM | POA: Diagnosis not present

## 2021-10-13 DIAGNOSIS — E739 Lactose intolerance, unspecified: Secondary | ICD-10-CM | POA: Diagnosis not present

## 2021-10-13 DIAGNOSIS — G43909 Migraine, unspecified, not intractable, without status migrainosus: Secondary | ICD-10-CM | POA: Diagnosis not present

## 2021-10-13 DIAGNOSIS — Z6828 Body mass index (BMI) 28.0-28.9, adult: Secondary | ICD-10-CM | POA: Diagnosis not present

## 2021-10-13 DIAGNOSIS — E7849 Other hyperlipidemia: Secondary | ICD-10-CM | POA: Diagnosis not present

## 2021-10-14 ENCOUNTER — Ambulatory Visit (INDEPENDENT_AMBULATORY_CARE_PROVIDER_SITE_OTHER): Payer: Medicare Other | Admitting: Urology

## 2021-10-14 ENCOUNTER — Other Ambulatory Visit: Payer: Self-pay

## 2021-10-14 ENCOUNTER — Encounter: Payer: Self-pay | Admitting: Urology

## 2021-10-14 VITALS — BP 116/73 | HR 55

## 2021-10-14 DIAGNOSIS — N2889 Other specified disorders of kidney and ureter: Secondary | ICD-10-CM

## 2021-10-14 LAB — URINALYSIS, ROUTINE W REFLEX MICROSCOPIC
Bilirubin, UA: NEGATIVE
Glucose, UA: NEGATIVE
Ketones, UA: NEGATIVE
Leukocytes,UA: NEGATIVE
Nitrite, UA: NEGATIVE
Protein,UA: NEGATIVE
RBC, UA: NEGATIVE
Specific Gravity, UA: 1.015 (ref 1.005–1.030)
Urobilinogen, Ur: 0.2 mg/dL (ref 0.2–1.0)
pH, UA: 7 (ref 5.0–7.5)

## 2021-10-14 NOTE — Progress Notes (Signed)
Urological Symptom Review  Patient is experiencing the following symptoms: none   Review of Systems  Gastrointestinal (upper)  : Indigestion/heartburn  Gastrointestinal (lower) : Negative for lower GI symptoms  Constitutional : Negative for symptoms  Skin: Negative for skin symptoms  Eyes: Negative for eye symptoms  Ear/Nose/Throat : Negative for Ear/Nose/Throat symptoms  Hematologic/Lymphatic: Negative for Hematologic/Lymphatic symptoms  Cardiovascular : Negative for cardiovascular symptoms  Respiratory : Negative for respiratory symptoms  Endocrine: Negative for endocrine symptoms  Musculoskeletal: Negative for musculoskeletal symptoms  Neurological: Negative for neurological symptoms  Psychologic: Negative for psychiatric symptoms

## 2021-10-14 NOTE — Progress Notes (Signed)
Assessment: 1. Renal mass, left      Plan: I personally reviewed the patient's records including the imaging studies, specifically the MRI from 10/12/2021 showing a 3 cm solid mass in the upper pole of the left kidney. Consult with Radiology re mass and possible role of percutaneous ablation Consult with Dr. Noah Delaine re possible robotic assisted partial nephrectomy Will contact patient with recommendations and plan Return to office in 2 weeks for further discussion.  I personally spent 45 minutes involved in face to face and non-face-to-face activities for this patient on the day of the visit.  Professional time spent included the following activities, in addition to those noted in the documentation: Review of patient's records including imaging study results and lab results, review of MRI images, discussion of findings and management options for an incidentally found renal mass including observation, surgical excision, and percutaneous ablation.   Chief Complaint:  Chief Complaint  Patient presents with   renal mass     History of Present Illness:  Debra Vasquez is a 75 y.o. year old female who is seen in consultation from Caryl Bis, MD  for evaluation of a left renal mass.  She was initially evaluated with a abdominal ultrasound on 09/23/2021 for evaluation of elevated LFTs and lipase.  This study showed a large simple appearing cyst in the left kidney and a solid-appearing mass in the upper pole of the left kidney.  Further evaluation with a MRI from 10/12/2021 showed a large exophytic simple cyst of the inferior pole of the left kidney and an exophytic enhancing mass at the superior pole of the left kidney measuring 3.7 x 3.6 x 3.6 cm in size, suspicious for renal cell carcinoma.  No evidence of metastatic disease or adenopathy seen.  She has had no flank pain or abdominal pain.  No gross hematuria.  No weight loss. Creatinine was 1.0 in 8/22.   Past Medical History:   Past Medical History:  Diagnosis Date   CHF (congestive heart failure) ([place])    Diabetes mellitus without complication ([place])    Dysphagia    GERD (gastroesophageal reflux disease)    Gout    Hypertension     Past Surgical History:  Past Surgical History:  Procedure Laterality Date   ABDOMINAL HYSTERECTOMY     CARPAL TUNNEL RELEASE Left    CATARACT EXTRACTION W/PHACO Right 09/13/2019   Procedure: CATARACT EXTRACTION PHACO AND INTRAOCULAR LENS PLACEMENT RIGHT EYE;  Surgeon: Baruch Goldmann, MD;  Location: [facility];  Service: Ophthalmology;  Laterality: Right;  right   CATARACT EXTRACTION W/PHACO Left 09/27/2019   Procedure: CATARACT EXTRACTION PHACO AND INTRAOCULAR LENS PLACEMENT LEFT EYE  (CDE: 8.04);  Surgeon: Baruch Goldmann, MD;  Location: [facility];  Service: Ophthalmology;  Laterality: Left;   ESOPHAGOGASTRODUODENOSCOPY N/A 11/18/2020   Procedure: ESOPHAGOGASTRODUODENOSCOPY (EGD);  Surgeon: Daneil Dolin, MD;  Location: [facility];  Service: Endoscopy;  Laterality: N/A;  7:30am   MALONEY DILATION N/A 11/18/2020   Procedure: Venia Minks DILATION;  Surgeon: Daneil Dolin, MD;  Location: [facility];  Service: Endoscopy;  Laterality: N/A;   TUBAL LIGATION      Allergies:  Allergies  Allergen Reactions   Gabapentin Other (See Comments)    syncope   Capoten [Captopril] Cough   Celexa [Citalopram Hydrobromide]     Passed out   Hydrocodone     "awake all night", felt terrible   Lexapro [Escitalopram Oxalate]     "see psychodelic lights"   Lipitor [  Atorvastatin Calcium] Other (See Comments)    Achy legs   Tramadol     confusion    Family History:  Family History  Problem Relation Age of Onset   Hyperlipidemia Mother    Heart attack Mother    Parkinson's disease Father    Stroke Father 8   Hyperlipidemia Sister    Alcoholism Brother    Diabetes Brother    Heart failure Paternal Grandmother    Colon cancer Neg Hx    Gastric cancer Neg Hx    Esophageal cancer Neg Hx     Liver disease Neg Hx    Pancreatic disease Neg Hx     Social History:  Social History   Tobacco Use   Smoking status: Former    Packs/day: 0.50    Years: 6.00    Pack years: 3.00    Types: Cigarettes    Quit date: 09/09/1985    Years since quitting: 36.1   Smokeless tobacco: Never  Vaping Use   Vaping Use: Never used  Substance Use Topics   Alcohol use: Not Currently    Comment: no h/o etoh use   Drug use: Never    Review of symptoms:  Constitutional:  Negative for unexplained weight loss, night sweats, fever, chills ENT:  Negative for nose bleeds, sinus pain, painful swallowing CV:  Negative for chest pain, shortness of breath, exercise intolerance, palpitations, loss of consciousness Resp:  Negative for cough, wheezing, shortness of breath GI:  Negative for nausea, vomiting, diarrhea, bloody stools GU:  Positives noted in HPI; otherwise negative for gross hematuria, dysuria, urinary incontinence Neuro:  Negative for seizures, poor balance, limb weakness, slurred speech Psych:  Negative for lack of energy, depression, anxiety Endocrine:  Negative for polydipsia, polyuria, symptoms of hypoglycemia (dizziness, hunger, sweating) Hematologic:  Negative for anemia, purpura, petechia, prolonged or excessive bleeding, use of anticoagulants  Allergic:  Negative for difficulty breathing or choking as a result of exposure to anything; no shellfish allergy; no allergic response (rash/itch) to materials, foods  Physical exam: BP 116/73   Pulse (!) 55  GENERAL APPEARANCE:  Well appearing, well developed, well nourished, NAD HEENT: Atraumatic, Normocephalic, oropharynx clear. NECK: Supple without lymphadenopathy or thyromegaly. LUNGS: Clear to auscultation bilaterally. HEART: Regular Rate and Rhythm without murmurs, gallops, or rubs. ABDOMEN: Soft, non-tender, No Masses. EXTREMITIES: Moves all extremities well.  Without clubbing, cyanosis, or edema. NEUROLOGIC:  Alert and oriented  x 3, normal gait, CN II-XII grossly intact.  MENTAL STATUS:  Appropriate. BACK:  Non-tender to palpation.  No CVAT SKIN:  Warm, dry and intact.    Results: Results for orders placed or performed in visit on 10/14/21 (from the past 48 hour(s))  Urinalysis, Routine w reflex microscopic     Status: None   Collection Time: 10/14/21  2:33 PM  Result Value Ref Range   Specific Gravity, UA 1.015 1.005 - 1.030   pH, UA 7.0 5.0 - 7.5   Color, UA Yellow Yellow   Appearance Ur Clear Clear   Leukocytes,UA Negative Negative   Protein,UA Negative Negative/Trace   Glucose, UA Negative Negative   Ketones, UA Negative Negative   RBC, UA Negative Negative   Bilirubin, UA Negative Negative   Urobilinogen, Ur 0.2 0.2 - 1.0 mg/dL   Nitrite, UA Negative Negative   Microscopic Examination Comment     Comment: Microscopic follows if indicated.

## 2021-10-15 DIAGNOSIS — N2889 Other specified disorders of kidney and ureter: Secondary | ICD-10-CM | POA: Insufficient documentation

## 2021-10-15 DIAGNOSIS — C642 Malignant neoplasm of left kidney, except renal pelvis: Secondary | ICD-10-CM | POA: Insufficient documentation

## 2021-10-27 ENCOUNTER — Ambulatory Visit (INDEPENDENT_AMBULATORY_CARE_PROVIDER_SITE_OTHER): Payer: Medicare Other | Admitting: Urology

## 2021-10-27 ENCOUNTER — Other Ambulatory Visit: Payer: Self-pay

## 2021-10-27 ENCOUNTER — Encounter: Payer: Self-pay | Admitting: Urology

## 2021-10-27 VITALS — BP 172/98 | HR 66 | Temp 98.6°F | Wt 167.0 lb

## 2021-10-27 DIAGNOSIS — N2889 Other specified disorders of kidney and ureter: Secondary | ICD-10-CM | POA: Diagnosis not present

## 2021-10-27 LAB — URINALYSIS, ROUTINE W REFLEX MICROSCOPIC
Bilirubin, UA: NEGATIVE
Glucose, UA: NEGATIVE
Nitrite, UA: NEGATIVE
Protein,UA: NEGATIVE
RBC, UA: NEGATIVE
Specific Gravity, UA: 1.025 (ref 1.005–1.030)
Urobilinogen, Ur: 0.2 mg/dL (ref 0.2–1.0)
pH, UA: 6 (ref 5.0–7.5)

## 2021-10-27 NOTE — Progress Notes (Signed)

## 2021-10-27 NOTE — Progress Notes (Signed)
10/27/2021 3:56 PM   [place] 18-May-1946 [id number]  Referring provider: Caryl Bis, MD [place],  [place] [id number]  Left renal mass   HPI: Debra Vasquez is a 75yo here for evaluation of a left renal mass. 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 She has a left pole renal cyst. NO gross hematuria. No significant LUTS. No recent weight loss. No night sweats.    PMH: Past Medical History:  Diagnosis Date   CHF (congestive heart failure) ([place])    Diabetes mellitus without complication ([place])    Dysphagia    GERD (gastroesophageal reflux disease)    Gout    Hypertension     Surgical History: Past Surgical History:  Procedure Laterality Date   ABDOMINAL HYSTERECTOMY     CARPAL TUNNEL RELEASE Left    CATARACT EXTRACTION W/PHACO Right 09/13/2019   Procedure: CATARACT EXTRACTION PHACO AND INTRAOCULAR LENS PLACEMENT RIGHT EYE;  Surgeon: Baruch Goldmann, MD;  Location: [facility];  Service: Ophthalmology;  Laterality: Right;  right   CATARACT EXTRACTION W/PHACO Left 09/27/2019   Procedure: CATARACT EXTRACTION PHACO AND INTRAOCULAR LENS PLACEMENT LEFT EYE  (CDE: 8.04);  Surgeon: Baruch Goldmann, MD;  Location: [facility];  Service: Ophthalmology;  Laterality: Left;   ESOPHAGOGASTRODUODENOSCOPY N/A 11/18/2020   Procedure: ESOPHAGOGASTRODUODENOSCOPY (EGD);  Surgeon: Daneil Dolin, MD;  Location[facility];  Service: Endoscopy;  Laterality: N/A;  7:30am   MALONEY DILATION N/A 11/18/2020   Procedure: Venia Minks DILATION;  Surgeon: Daneil Dolin, MD;  [facility];  Service: Endoscopy;  Laterality: N/A;   TUBAL LIGATION      Home Medications:  Allergies as of 10/27/2021       Reactions   Gabapentin Other (See Comments)   syncope   Capoten [captopril] Cough   Celexa [citalopram Hydrobromide]    Passed out   Hydrocodone    "awake all night", felt terrible   Lexapro [escitalopram Oxalate]    "see psychodelic  lights"   Lipitor [atorvastatin Calcium] Other (See Comments)   Achy legs   Tramadol    confusion        Medication List        Accurate as of October 27, 2021  3:56 PM. If you have any questions, ask your nurse or doctor.          allopurinol 300 MG tablet Commonly known as: ZYLOPRIM Take 300 mg by mouth in the morning.   aspirin EC 81 MG tablet Take 81 mg by mouth 3 (three) times a week. Swallow whole.   CALCARB 600/D PO Take 1 tablet by mouth 3 (three) times a week.   cloNIDine 0.3 MG tablet Commonly known as: CATAPRES Take 0.3 mg by mouth 2 (two) times daily.   furosemide 20 MG tablet Commonly known as: LASIX Take 20 mg by mouth daily as needed for edema.   glipiZIDE 5 MG 24 hr tablet Commonly known as: GLUCOTROL XL Take 5 mg by mouth in the morning.   ibuprofen 200 MG tablet Commonly known as: ADVIL Take 400-600 mg by mouth every 6 (six) hours as needed for headache or moderate pain.   magnesium oxide 400 MG tablet Commonly known as: MAG-OX Take 400 mg by mouth daily.   metFORMIN 500 MG 24 hr tablet Commonly known as: GLUCOPHAGE-XR Take 500 mg by mouth at bedtime.   metoprolol tartrate 50 MG tablet Commonly known as: LOPRESSOR Take  50 mg by mouth 2 (two) times daily.   pantoprazole 40 MG tablet Commonly known as: PROTONIX TAKE 1 TABLET(40 MG) BY MOUTH TWICE DAILY BEFORE A MEAL   rosuvastatin 5 MG tablet Commonly known as: CRESTOR Take 5 mg by mouth daily.   valsartan-hydrochlorothiazide 320-12.5 MG tablet Commonly known as: DIOVAN-HCT Take 1 tablet by mouth daily.        Allergies:  Allergies  Allergen Reactions   Gabapentin Other (See Comments)    syncope   Capoten [Captopril] Cough   Celexa [Citalopram Hydrobromide]     Passed out   Hydrocodone     "awake all night", felt terrible   Lexapro [Escitalopram Oxalate]     "see psychodelic lights"   Lipitor [Atorvastatin Calcium] Other (See Comments)    Achy legs   Tramadol      confusion    Family History: Family History  Problem Relation Age of Onset   Hyperlipidemia Mother    Heart attack Mother    Parkinson's disease Father    Stroke Father 69   Hyperlipidemia Sister    Alcoholism Brother    Diabetes Brother    Heart failure Paternal Grandmother    Colon cancer Neg Hx    Gastric cancer Neg Hx    Esophageal cancer Neg Hx    Liver disease Neg Hx    Pancreatic disease Neg Hx     Social History:  reports that she quit smoking about 36 years ago. Her smoking use included cigarettes. She has a 3.00 pack-year smoking history. She has never used smokeless tobacco. She reports that she does not currently use alcohol. She reports that she does not use drugs.  ROS: All other review of systems were reviewed and are negative except what is noted above in HPI  Physical Exam: BP (!) 172/98   Pulse 66   Temp 98.6 F (37 C)   Wt 167 lb (75.8 kg)   BMI 27.79 kg/m   Constitutional:  Alert and oriented, No acute distress. HEENT: [place] AT, moist mucus membranes.  Trachea midline, no masses. Cardiovascular: No clubbing, cyanosis, or edema. Respiratory: Normal respiratory effort, no increased work of breathing. GI: Abdomen is soft, nontender, nondistended, no abdominal masses GU: No CVA tenderness.  Lymph: No cervical or inguinal lymphadenopathy. Skin: No rashes, bruises or suspicious lesions. Neurologic: Grossly intact, no focal deficits, moving all 4 extremities. Psychiatric: Normal mood and affect.  Laboratory Data: Lab Results  Component Value Date   WBC 11.8 (H) 08/12/2021   HGB 12.7 08/12/2021   HCT 38.2 08/12/2021   MCV 88.0 08/12/2021   PLT 169 08/12/2021    Lab Results  Component Value Date   CREATININE 1.00 08/12/2021    No results found for: PSA  No results found for: TESTOSTERONE  Lab Results  Component Value Date   HGBA1C 7.5 (H) 09/10/2019    Urinalysis    Component Value Date/Time   APPEARANCEUR Clear 10/14/2021 1433    GLUCOSEU Negative 10/14/2021 1433   BILIRUBINUR Negative 10/14/2021 1433   PROTEINUR Negative 10/14/2021 1433   NITRITE Negative 10/14/2021 1433   LEUKOCYTESUR Negative 10/14/2021 1433    Lab Results  Component Value Date   LABMICR Comment 10/14/2021    Pertinent Imaging: MRI 10/07/2021: Images reviewed and discussed with the patient  No results found for this or any previous visit.  No results found for this or any previous visit.  No results found for this or any previous visit.  No results found for  this or any previous visit.  No results found for this or any previous visit.  No results found for this or any previous visit.  No results found for this or any previous visit.  No results found for this or any previous visit.   Assessment & Plan:    1. Renal mass We discussed the natural hx of renal masses and the 80/20 malignant/benign likelihood. We disucssed the treatment options including active surveillance. Renal ablation, partial and radical nephrectomy. After discussing the options the patient elects for robotic left partial nephrectomy.  - Urinalysis, Routine w reflex microscopic   No follow-ups on file.  Nicolette Bang, MD  [facility] [place]

## 2021-10-27 NOTE — Patient Instructions (Signed)
Renal Mass  A renal mass is an abnormal growth in the kidney. It may be found while performing an MRI, CT scan, or ultrasound to evaluate other problems of the abdomen. A renal mass that is cancerous (malignant) may grow or spread quickly. Others are not cancerous (benign). Renal masses include: Tumors. These may be malignant or benign. The most common type of kidney cancer in adults is renal cell carcinoma. In children, the most common type of kidney cancer is Wilms tumor. The most common benign tumors of the kidney include renal adenomas, oncocytomas, and angiomyolipoma (AML). Cysts. These are fluid-filled sacs that form on or in the kidney. What are the causes? Certain types of cancers, infections, or injuries can cause a renal mass. It isnot always known what causes a cyst to develop in or on the kidney. What are the signs or symptoms? Often, a renal mass does not cause any signs or symptoms; most kidney cysts donot cause symptoms. How is this diagnosed? Your health care provider may recommend tests to diagnose the cause of your renal mass. These tests may be done if a renal mass is found: Physical exam. Blood tests. Urine tests. Imaging tests, such as ultrasound, CT scan, or MRI. Biopsy. This is a small sample that is removed from the renal mass and tested in a lab. The exact tests and how often they are done will depend on: The size and appearance of the renal mass. Risk factors or medical conditions that increase your risk for problems. Any symptoms associated with the renal mass, or concerns that you have about it. Tests and physical exams may be done once, or they may be done regularly for a period of time. Tests and exams that are done regularly will help monitorwhether the mass is growing and beginning to cause problems. How is this treated? Treatment is not always needed for this condition. Your health care provider may recommend careful monitoring and regular tests and exams.  Treatment willdepend on the cause of the mass. Treatment for a cancerous renal mass may include surgical removal,chemotherapy, radiation, or immunotherapy. Most kidney cysts do not need to be treated. Follow these instructions at home: What you need to do at home will depend on the cause of the mass. Follow the instructions that your health care provider gives to you. In general: Take over-the-counter and prescription medicines only as told by your health care provider. If you were prescribed an antibiotic medicine, take it as told by your health care provider. Do not stop taking the antibiotic even if you start to feel better. Follow any restrictions that are given to you by your health care provider. Keep all follow-up visits. This is important. You may need to see your health care provider once or twice a year to have CT scans and ultrasounds. These tests will show if your renal mass has changed or grown. Contact a health care provider if you: Have pain in your side or back (flank pain). Have a fever. Feel full soon after eating. Have pain or swelling in the abdomen. Lose weight. Get help right away if: Your pain gets worse. There is blood in your urine. You cannot urinate. You have chest pain. You have trouble breathing. These symptoms may represent a serious problem that is an emergency. Do not wait to see if the symptoms will go away. Get medical help right away. Call your local emergency services (911 in the [place] Summary A renal mass is an abnormal growth in the kidney.   It may be cancerous (malignant) and grow or spread quickly, or it may not be cancerous (benign). Renal masses often do not have any signs or symptoms. Renal masses may be found while performing an MRI, CT scan, or ultrasound for other problems of the abdomen. Your health care provider may recommend that you have tests to diagnose the cause of your renal mass. These may include a physical exam, blood tests, urine  tests, imaging, or a biopsy. Treatment is not always needed for this condition. Careful monitoring may be recommended. This information is not intended to replace advice given to you by your health care provider. Make sure you discuss any questions you have with your healthcare provider. Document Revised: 06/08/2020 Document Reviewed: 06/08/2020 Elsevier Patient Education  2022 [employer].  

## 2021-10-28 ENCOUNTER — Ambulatory Visit: Payer: Medicare Other | Admitting: Urology

## 2021-11-01 ENCOUNTER — Other Ambulatory Visit: Payer: Self-pay

## 2021-11-01 DIAGNOSIS — N2889 Other specified disorders of kidney and ureter: Secondary | ICD-10-CM

## 2021-11-01 MED ORDER — MAGNESIUM CITRATE PO SOLN: 1.0000 | Freq: Once | ORAL | 0 refills | Status: AC

## 2021-11-01 NOTE — Progress Notes (Signed)
Patient called and surgery date given to patient. Patient voiced understanding.

## 2021-11-09 DIAGNOSIS — D529 Folate deficiency anemia, unspecified: Secondary | ICD-10-CM | POA: Diagnosis not present

## 2021-11-09 DIAGNOSIS — D519 Vitamin B12 deficiency anemia, unspecified: Secondary | ICD-10-CM | POA: Diagnosis not present

## 2021-11-09 DIAGNOSIS — D649 Anemia, unspecified: Secondary | ICD-10-CM | POA: Diagnosis not present

## 2021-11-09 DIAGNOSIS — I10 Essential (primary) hypertension: Secondary | ICD-10-CM | POA: Diagnosis not present

## 2021-11-09 DIAGNOSIS — E782 Mixed hyperlipidemia: Secondary | ICD-10-CM | POA: Diagnosis not present

## 2021-11-09 DIAGNOSIS — Z1329 Encounter for screening for other suspected endocrine disorder: Secondary | ICD-10-CM | POA: Diagnosis not present

## 2021-11-17 DIAGNOSIS — I5032 Chronic diastolic (congestive) heart failure: Secondary | ICD-10-CM | POA: Diagnosis not present

## 2021-11-23 NOTE — Progress Notes (Addendum)
COVID swab appointment:  12-03-21  COVID Vaccine Completed:  No Date COVID Vaccine completed: Has received booster: COVID vaccine manufacturer: [place]   Date of COVID positive in last 90 days: No  PCP - Gar Ponto, MD (office note on chart)  Cardiologist - Carlyle Dolly, MD.  Previously seen at [facility]. Patient has not seen anyone since 2021  Chest x-ray - 08-12-21 Epic EKG - 08-12-21 Epic Stress Test - greater than 2 years, Epic ECHO - 06-04-20 [facility] Cardiac Cath - greater than 2 years, [facility] Pacemaker/ICD device last checked: Spinal Cord Stimulator:  Sleep Study -  No CPAP -   Fasting Blood Sugar - 79 to 180 Checks Blood Sugar - checks occasionally  Blood Thinner Instructions: Aspirin Instructions:  ASA 81 mg.  Patient can stay on Per Dr. Alyson Ingles Last Dose:  Activity level:  Can go up a flight of stairs and perform activities of daily living without stopping and without symptoms of chest pain or shortness of breath.    Anesthesia review: CHF, DM, HTN. Hx of chest pain and SOB.  Denies recent episodes of chest pain and states that she rarely has shortness of breath.   Patient denies shortness of breath, fever, cough and chest pain at PAT appointment   Patient verbalized understanding of instructions that were given to them at the PAT appointment. Patient was also instructed that they will need to review over the PAT instructions again at home before surgery.

## 2021-11-23 NOTE — Patient Instructions (Addendum)
DUE TO COVID-19 ONLY ONE VISITOR IS ALLOWED TO COME WITH YOU AND STAY IN THE WAITING ROOM ONLY DURING PRE OP AND PROCEDURE.   **NO VISITORS ARE ALLOWED IN THE SHORT STAY AREA OR RECOVERY ROOM!!**  IF YOU WILL BE ADMITTED INTO THE HOSPITAL YOU ARE ALLOWED ONLY TWO SUPPORT PEOPLE DURING VISITATION HOURS ONLY (7 AM -8PM)    Up to two visitors ages 26+ are allowed at one time in a patient's room.  The visitors may rotate out with other people throughout the day.  Additionally, up to two children between the ages of 73 and 8 are allowed and do not count toward the number of allowed visitors.  Children within this age range must be accompanied by an adult visitor.  One adult visitor may remain with the patient overnight and must be in the room by 8 PM.  COVID SWAB TESTING MUST BE COMPLETED ON:  Friday, 12-03-21 Between the hours of 8 and 3  **MUST PRESENT COMPLETED FORM AT TESTING SITE**    [facility] [place] (backside of the building)  You are not required to quarantine, however you are required to wear a well-fitted mask when you are out and around people not in your household.  Hand Hygiene often Do NOT share personal items Notify your provider if you are in close contact with someone who has COVID or you develop fever 100.4 or greater, new onset of sneezing, cough, sore throat, shortness of breath or body aches.        Your procedure is scheduled on:  Tuesday, 12-07-21   Report to [facility] Main  Entrance     Report to admitting at 9:45 AM   Call this number if you have problems the morning of surgery [phone]   Follow a clear liquid day day before surgery    Miralax 85 g x2 day before surgery   May have liquids until 9:00 AM day of surgery  CLEAR LIQUID DIET  Foods Allowed                                                                     Foods Excluded  Water, Black Coffee (no milk/no creamer) and tea, regular and decaf                               liquids that you cannot  Plain Jell-O in any flavor  (No red)                         see through such as: Fruit ices (not with fruit pulp)                                 milk, soups, orange juice  Iced Popsicles (No red)                                    All solid food  Apple juices Sports drinks like Gatorade (No red) Lightly seasoned clear broth or consume(fat free) Sugar  Oral Hygiene is also important to reduce your risk of infection.                                    Remember - BRUSH YOUR TEETH THE MORNING OF SURGERY WITH YOUR REGULAR TOOTHPASTE   Do NOT smoke after Midnight   Take these medicines the morning of surgery with A SIP OF WATER: Allopurinol, Clonidine, Metoprolol, Pantoprazole, Rosuvastatin  How to Manage Your Diabetes Before and After Surgery  Why is it important to control my blood sugar before and after surgery? Improving blood sugar levels before and after surgery helps healing and can limit problems. A way of improving blood sugar control is eating a healthy diet by:  Eating less sugar and carbohydrates  Increasing activity/exercise  Talking with your doctor about reaching your blood sugar goals High blood sugars (greater than 180 mg/dL) can raise your risk of infections and slow your recovery, so you will need to focus on controlling your diabetes during the weeks before surgery. Make sure that the doctor who takes care of your diabetes knows about your planned surgery including the date and location.  How do I manage my blood sugar before surgery? Check your blood sugar at least 4 times a day, starting 2 days before surgery, to make sure that the level is not too high or low. Check your blood sugar the morning of your surgery when you wake up and every 2 hours until you get to the Short Stay unit. If your blood sugar is less than 70 mg/dL, you will need to treat for low blood sugar: Do not take insulin. Treat a low blood sugar  (less than 70 mg/dL) with  cup of clear juice (cranberry or apple), 4 glucose tablets, OR glucose gel. Recheck blood sugar in 15 minutes after treatment (to make sure it is greater than 70 mg/dL). If your blood sugar is not greater than 70 mg/dL on recheck, call [phone] for further instructions. Report your blood sugar to the short stay nurse when you get to Short Stay.  If you are admitted to the hospital after surgery: Your blood sugar will be checked by the staff and you will probably be given insulin after surgery (instead of oral diabetes medicines) to make sure you have good blood sugar levels. The goal for blood sugar control after surgery is 80-180 mg/dL.   WHAT DO I DO ABOUT MY DIABETES MEDICATION?  Do not take oral diabetes medicines (pills) the morning of surgery.  THE DAY BEFORE SURGERY:  Take Glipizide as prescribed (no evening dose)  Take Metformin as prescribed.       THE MORNING OF SURGERY:  Do not take Glipizide or Metformin.  Reviewed and Endorsed by [facility] Patient Education Committee, August 2015               Aspirin - Ok to stay on    Stop all vitamins and herbal supplements a week before surgery             You may not have any metal on your body including hair pins, jewelry, and body piercing             Do not wear make-up, lotions, powders, perfumes or deodorant  Do not wear nail polish including gel and S&S, artificial/acrylic nails,  or any other type of covering on natural nails including finger and toenails. If you have artificial nails, gel coating, etc. that needs to be removed by a nail salon please have this removed prior to surgery or surgery may need to be canceled/ delayed if the surgeon/ anesthesia feels like they are unable to be safely monitored.   Do not shave  48 hours prior to surgery.   Do not bring valuables to the hospital. [place].   Contacts, dentures or bridgework may not be worn into  surgery.   Bring small overnight bag day of surgery.  Please read over the following fact sheets you were given: IF YOU HAVE QUESTIONS ABOUT YOUR PRE OP INSTRUCTIONS PLEASE CALL [facility] - Preparing for Surgery Before surgery, you can play an important role.  Because skin is not sterile, your skin needs to be as free of germs as possible.  You can reduce the number of germs on your skin by washing with CHG (chlorahexidine gluconate) soap before surgery.  CHG is an antiseptic cleaner which kills germs and bonds with the skin to continue killing germs even after washing. Please DO NOT use if you have an allergy to CHG or antibacterial soaps.  If your skin becomes reddened/irritated stop using the CHG and inform your nurse when you arrive at Short Stay. Do not shave (including legs and underarms) for at least 48 hours prior to the first CHG shower.  You may shave your face/neck.  Please follow these instructions carefully:  1.  Shower with CHG Soap the night before surgery and the  morning of surgery.  2.  If you choose to wash your hair, wash your hair first as usual with your normal  shampoo.  3.  After you shampoo, rinse your hair and body thoroughly to remove the shampoo.                             4.  Use CHG as you would any other liquid soap.  You can apply chg directly to the skin and wash.  Gently with a scrungie or clean washcloth.  5.  Apply the CHG Soap to your body ONLY FROM THE NECK DOWN.   Do   not use on face/ open                           Wound or open sores. Avoid contact with eyes, ears mouth and   genitals (private parts).                       Wash face,  Genitals (private parts) with your normal soap.             6.  Wash thoroughly, paying special attention to the area where your    surgery  will be performed.  7.  Thoroughly rinse your body with warm water from the neck down.  8.  DO NOT shower/wash with your normal soap after using and rinsing off  the CHG Soap.                9.  Pat yourself dry with a clean towel.            10.  Wear clean pajamas.            11.  Place  clean sheets on your bed the night of your first shower and do not  sleep with pets. Day of Surgery : Do not apply any lotions/deodorants the morning of surgery.  Please wear clean clothes to the hospital/surgery center.  FAILURE TO FOLLOW THESE INSTRUCTIONS MAY RESULT IN THE CANCELLATION OF YOUR SURGERY  PATIENT SIGNATURE_________________________________  NURSE SIGNATURE__________________________________  ________________________________________________________________________  WHAT IS A BLOOD TRANSFUSION? Blood Transfusion Information  A transfusion is the replacement of blood or some of its parts. Blood is made up of multiple cells which provide different functions. Red blood cells carry oxygen and are used for blood loss replacement. White blood cells fight against infection. Platelets control bleeding. Plasma helps clot blood. Other blood products are available for specialized needs, such as hemophilia or other clotting disorders. BEFORE THE TRANSFUSION  Who gives blood for transfusions?  Healthy volunteers who are fully evaluated to make sure their blood is safe. This is blood bank blood. Transfusion therapy is the safest it has ever been in the practice of medicine. Before blood is taken from a donor, a complete history is taken to make sure that person has no history of diseases nor engages in risky social behavior (examples are intravenous drug use or sexual activity with multiple partners). The donor's travel history is screened to minimize risk of transmitting infections, such as malaria. The donated blood is tested for signs of infectious diseases, such as HIV and hepatitis. The blood is then tested to be sure it is compatible with you in order to minimize the chance of a transfusion reaction. If you or a relative donates blood, this is often done in  anticipation of surgery and is not appropriate for emergency situations. It takes many days to process the donated blood. RISKS AND COMPLICATIONS Although transfusion therapy is very safe and saves many lives, the main dangers of transfusion include:  Getting an infectious disease. Developing a transfusion reaction. This is an allergic reaction to something in the blood you were given. Every precaution is taken to prevent this. The decision to have a blood transfusion has been considered carefully by your caregiver before blood is given. Blood is not given unless the benefits outweigh the risks. AFTER THE TRANSFUSION Right after receiving a blood transfusion, you will usually feel much better and more energetic. This is especially true if your red blood cells have gotten low (anemic). The transfusion raises the level of the red blood cells which carry oxygen, and this usually causes an energy increase. The nurse administering the transfusion will monitor you carefully for complications. HOME CARE INSTRUCTIONS  No special instructions are needed after a transfusion. You may find your energy is better. Speak with your caregiver about any limitations on activity for underlying diseases you may have. SEEK MEDICAL CARE IF:  Your condition is not improving after your transfusion. You develop redness or irritation at the intravenous (IV) site. SEEK IMMEDIATE MEDICAL CARE IF:  Any of the following symptoms occur over the next 12 hours: Shaking chills. You have a temperature by mouth above 102 F (38.9 C), not controlled by medicine. Chest, back, or muscle pain. People around you feel you are not acting correctly or are confused. Shortness of breath or difficulty breathing. Dizziness and fainting. You get a rash or develop hives. You have a decrease in urine output. Your urine turns a dark color or changes to pink, red, or brown. Any of the following symptoms occur over the next 10 days: You have a  temperature by  mouth above 102 F (38.9 C), not controlled by medicine. Shortness of breath. Weakness after normal activity. The white part of the eye turns yellow (jaundice). You have a decrease in the amount of urine or are urinating less often. Your urine turns a dark color or changes to pink, red, or brown. Document Released: 12/09/2000 Document Revised: 03/05/2012 Document Reviewed: 07/28/2008 [facility] Patient Information 2014 [place], [place].  _______________________________________________________________________

## 2021-11-26 ENCOUNTER — Encounter (HOSPITAL_COMMUNITY): Payer: Self-pay

## 2021-11-26 ENCOUNTER — Other Ambulatory Visit: Payer: Self-pay

## 2021-11-26 ENCOUNTER — Encounter (HOSPITAL_COMMUNITY)
Admission: RE | Admit: 2021-11-26 | Discharge: 2021-11-26 | Disposition: A | Discharge status: Home / Self Care | Payer: Medicare Other | Source: Ambulatory Visit | Attending: Urology | Admitting: Urology

## 2021-11-26 VITALS — BP 137/79 | HR 52 | Temp 98.4°F | Resp 18 | Ht 65.0 in | Wt 163.0 lb

## 2021-11-26 DIAGNOSIS — N2889 Other specified disorders of kidney and ureter: Secondary | ICD-10-CM | POA: Insufficient documentation

## 2021-11-26 DIAGNOSIS — E119 Type 2 diabetes mellitus without complications: Secondary | ICD-10-CM | POA: Insufficient documentation

## 2021-11-26 DIAGNOSIS — Z01812 Encounter for preprocedural laboratory examination: Secondary | ICD-10-CM | POA: Insufficient documentation

## 2021-11-26 HISTORY — DX: Family history of other specified conditions: Z84.89

## 2021-11-26 HISTORY — DX: Unspecified osteoarthritis, unspecified site: M19.90

## 2021-11-26 HISTORY — DX: Other specified disorders of kidney and ureter: N28.89

## 2021-11-26 LAB — PROTIME-INR
INR: 1.1 (ref 0.8–1.2)
Prothrombin Time: 14.1 seconds (ref 11.4–15.2)

## 2021-11-26 LAB — HEMOGLOBIN A1C
Hgb A1c MFr Bld: 7.1 % — ABNORMAL HIGH (ref 4.8–5.6)
Mean Plasma Glucose: 157.07 mg/dL

## 2021-11-26 LAB — TYPE AND SCREEN
ABO/RH(D): O NEG
Antibody Screen: NEGATIVE

## 2021-11-26 LAB — GLUCOSE, CAPILLARY: Glucose-Capillary: 141 mg/dL — ABNORMAL HIGH (ref 70–99)

## 2021-11-26 LAB — APTT: aPTT: 29 seconds (ref 24–36)

## 2021-11-26 NOTE — Progress Notes (Signed)
Spoke with Dr. Alyson Ingles via chat and he stated that patient can stay on Aspirin 81 mg, and to do a clear liquid diet day before surgery and Miralax 85 g x2 since Mag Citrate is on backorder.  Patient was provided this information and stated understanding.

## 2021-12-03 ENCOUNTER — Other Ambulatory Visit: Payer: Self-pay | Admitting: Urology

## 2021-12-04 LAB — SARS CORONAVIRUS 2 (TAT 6-24 HRS): SARS Coronavirus 2: NEGATIVE

## 2021-12-07 ENCOUNTER — Inpatient Hospital Stay (HOSPITAL_COMMUNITY)
Admission: RE | Admit: 2021-12-07 | Discharge: 2021-12-10 | DRG: 658 | Disposition: A | Discharge status: Home / Self Care | Payer: Medicare Other | Source: Ambulatory Visit | Attending: Urology | Admitting: Urology

## 2021-12-07 ENCOUNTER — Other Ambulatory Visit: Payer: Self-pay

## 2021-12-07 ENCOUNTER — Encounter (HOSPITAL_COMMUNITY)
Admission: RE | Disposition: A | Discharge status: Home / Self Care | Payer: Self-pay | Source: Ambulatory Visit | Attending: Urology

## 2021-12-07 ENCOUNTER — Inpatient Hospital Stay (HOSPITAL_COMMUNITY): Payer: Medicare Other | Admitting: Physician Assistant

## 2021-12-07 ENCOUNTER — Inpatient Hospital Stay (HOSPITAL_COMMUNITY): Payer: Medicare Other | Admitting: Certified Registered Nurse Anesthetist

## 2021-12-07 ENCOUNTER — Encounter (HOSPITAL_COMMUNITY): Payer: Self-pay | Admitting: Urology

## 2021-12-07 DIAGNOSIS — Z7984 Long term (current) use of oral hypoglycemic drugs: Secondary | ICD-10-CM | POA: Diagnosis not present

## 2021-12-07 DIAGNOSIS — I110 Hypertensive heart disease with heart failure: Secondary | ICD-10-CM | POA: Diagnosis not present

## 2021-12-07 DIAGNOSIS — C642 Malignant neoplasm of left kidney, except renal pelvis: Principal | ICD-10-CM | POA: Diagnosis present

## 2021-12-07 DIAGNOSIS — N281 Cyst of kidney, acquired: Secondary | ICD-10-CM | POA: Diagnosis present

## 2021-12-07 DIAGNOSIS — E119 Type 2 diabetes mellitus without complications: Secondary | ICD-10-CM | POA: Diagnosis not present

## 2021-12-07 DIAGNOSIS — Z833 Family history of diabetes mellitus: Secondary | ICD-10-CM | POA: Diagnosis not present

## 2021-12-07 DIAGNOSIS — L299 Pruritus, unspecified: Secondary | ICD-10-CM | POA: Diagnosis present

## 2021-12-07 DIAGNOSIS — Z79899 Other long term (current) drug therapy: Secondary | ICD-10-CM

## 2021-12-07 DIAGNOSIS — I509 Heart failure, unspecified: Secondary | ICD-10-CM | POA: Diagnosis present

## 2021-12-07 DIAGNOSIS — K219 Gastro-esophageal reflux disease without esophagitis: Secondary | ICD-10-CM | POA: Diagnosis not present

## 2021-12-07 DIAGNOSIS — Z8249 Family history of ischemic heart disease and other diseases of the circulatory system: Secondary | ICD-10-CM | POA: Diagnosis not present

## 2021-12-07 DIAGNOSIS — M109 Gout, unspecified: Secondary | ICD-10-CM | POA: Diagnosis not present

## 2021-12-07 DIAGNOSIS — Z20822 Contact with and (suspected) exposure to covid-19: Secondary | ICD-10-CM | POA: Diagnosis not present

## 2021-12-07 DIAGNOSIS — N2889 Other specified disorders of kidney and ureter: Secondary | ICD-10-CM

## 2021-12-07 DIAGNOSIS — Z823 Family history of stroke: Secondary | ICD-10-CM | POA: Diagnosis not present

## 2021-12-07 DIAGNOSIS — Z90710 Acquired absence of both cervix and uterus: Secondary | ICD-10-CM | POA: Diagnosis not present

## 2021-12-07 DIAGNOSIS — Z87891 Personal history of nicotine dependence: Secondary | ICD-10-CM

## 2021-12-07 HISTORY — PX: ROBOTIC ASSITED PARTIAL NEPHRECTOMY: SHX6087

## 2021-12-07 LAB — HEMOGLOBIN AND HEMATOCRIT, BLOOD
HCT: 39.2 % (ref 36.0–46.0)
Hemoglobin: 12.9 g/dL (ref 12.0–15.0)

## 2021-12-07 LAB — GLUCOSE, CAPILLARY
Glucose-Capillary: 118 mg/dL — ABNORMAL HIGH (ref 70–99)
Glucose-Capillary: 183 mg/dL — ABNORMAL HIGH (ref 70–99)
Glucose-Capillary: 245 mg/dL — ABNORMAL HIGH (ref 70–99)

## 2021-12-07 LAB — ABO/RH: ABO/RH(D): O NEG

## 2021-12-07 SURGERY — NEPHRECTOMY, PARTIAL, ROBOT-ASSISTED
Anesthesia: General | Site: Renal | Laterality: Left

## 2021-12-07 MED ORDER — BUPIVACAINE LIPOSOME 1.3 % IJ SUSP
INTRAMUSCULAR | Status: DC | PRN
  Administered 2021-12-07: 20 mL

## 2021-12-07 MED ORDER — PROPOFOL 10 MG/ML IV BOLUS
INTRAVENOUS | Status: AC
  Filled 2021-12-07: qty 20

## 2021-12-07 MED ORDER — KETAMINE HCL-SODIUM CHLORIDE 100-0.9 MG/10ML-% IV SOSY
PREFILLED_SYRINGE | INTRAVENOUS | Status: AC
  Filled 2021-12-07: qty 10

## 2021-12-07 MED ORDER — LABETALOL HCL 5 MG/ML IV SOLN
INTRAVENOUS | Status: AC
  Filled 2021-12-07: qty 4

## 2021-12-07 MED ORDER — ONDANSETRON HCL 4 MG/2ML IJ SOLN
INTRAMUSCULAR | Status: AC
  Filled 2021-12-07: qty 2

## 2021-12-07 MED ORDER — EPHEDRINE SULFATE-NACL 50-0.9 MG/10ML-% IV SOSY
PREFILLED_SYRINGE | INTRAVENOUS | Status: DC | PRN
  Administered 2021-12-07: 10 mg via INTRAVENOUS

## 2021-12-07 MED ORDER — DEXAMETHASONE SODIUM PHOSPHATE 10 MG/ML IJ SOLN
INTRAMUSCULAR | Status: DC | PRN
  Administered 2021-12-07: 10 mg via INTRAVENOUS

## 2021-12-07 MED ORDER — FENTANYL CITRATE (PF) 100 MCG/2ML IJ SOLN
INTRAMUSCULAR | Status: DC | PRN
  Administered 2021-12-07 (×4): 50 ug via INTRAVENOUS

## 2021-12-07 MED ORDER — CEFAZOLIN SODIUM-DEXTROSE 2-4 GM/100ML-% IV SOLN
2.0000 g | INTRAVENOUS | Status: AC
  Administered 2021-12-07: 2 g via INTRAVENOUS
  Filled 2021-12-07: qty 100

## 2021-12-07 MED ORDER — SODIUM CHLORIDE 0.45 % IV SOLN
INTRAVENOUS | Status: DC
  Filled 2021-12-07: qty 1000

## 2021-12-07 MED ORDER — LIDOCAINE HCL (CARDIAC) PF 100 MG/5ML IV SOSY
PREFILLED_SYRINGE | INTRAVENOUS | Status: DC | PRN
  Administered 2021-12-07: 60 mg via INTRAVENOUS

## 2021-12-07 MED ORDER — LACTATED RINGERS IV SOLN: INTRAVENOUS | Status: DC

## 2021-12-07 MED ORDER — ROSUVASTATIN CALCIUM 5 MG PO TABS
5.0000 mg | ORAL_TABLET | Freq: Every day | ORAL | Status: DC
  Administered 2021-12-07 – 2021-12-09 (×3): 5 mg via ORAL
  Filled 2021-12-07 (×3): qty 1

## 2021-12-07 MED ORDER — ACETAMINOPHEN 500 MG PO TABS
1000.0000 mg | ORAL_TABLET | Freq: Once | ORAL | Status: AC
  Administered 2021-12-07: 1000 mg via ORAL
  Filled 2021-12-07: qty 2

## 2021-12-07 MED ORDER — INSULIN ASPART 100 UNIT/ML IJ SOLN
0.0000 [IU] | Freq: Three times a day (TID) | INTRAMUSCULAR | Status: DC
  Administered 2021-12-08 (×3): 3 [IU] via SUBCUTANEOUS
  Administered 2021-12-09: 2 [IU] via SUBCUTANEOUS
  Administered 2021-12-09: 5 [IU] via SUBCUTANEOUS
  Administered 2021-12-09 – 2021-12-10 (×2): 2 [IU] via SUBCUTANEOUS

## 2021-12-07 MED ORDER — DOCUSATE SODIUM 100 MG PO CAPS: 100.0000 mg | ORAL_CAPSULE | Freq: Two times a day (BID) | ORAL | Status: DC

## 2021-12-07 MED ORDER — OXYCODONE HCL 5 MG PO TABS
5.0000 mg | ORAL_TABLET | Freq: Four times a day (QID) | ORAL | 0 refills | Status: AC | PRN
Start: 2021-12-07 — End: 2022-12-07

## 2021-12-07 MED ORDER — BUPIVACAINE LIPOSOME 1.3 % IJ SUSP
INTRAMUSCULAR | Status: AC
  Filled 2021-12-07: qty 20

## 2021-12-07 MED ORDER — PHENYLEPHRINE 40 MCG/ML (10ML) SYRINGE FOR IV PUSH (FOR BLOOD PRESSURE SUPPORT)
PREFILLED_SYRINGE | INTRAVENOUS | Status: DC | PRN
  Administered 2021-12-07: 160 ug via INTRAVENOUS

## 2021-12-07 MED ORDER — OXYCODONE HCL 5 MG PO TABS
5.0000 mg | ORAL_TABLET | ORAL | Status: DC | PRN
  Administered 2021-12-07 – 2021-12-09 (×6): 5 mg via ORAL
  Filled 2021-12-07 (×7): qty 1

## 2021-12-07 MED ORDER — SODIUM CHLORIDE (PF) 0.9 % IJ SOLN
INTRAMUSCULAR | Status: AC
  Filled 2021-12-07: qty 20

## 2021-12-07 MED ORDER — DOCUSATE SODIUM 100 MG PO CAPS
100.0000 mg | ORAL_CAPSULE | Freq: Two times a day (BID) | ORAL | Status: DC
  Administered 2021-12-07 – 2021-12-08 (×3): 100 mg via ORAL
  Filled 2021-12-07 (×5): qty 1

## 2021-12-07 MED ORDER — METOPROLOL TARTRATE 50 MG PO TABS
50.0000 mg | ORAL_TABLET | Freq: Two times a day (BID) | ORAL | Status: DC
  Administered 2021-12-07 – 2021-12-10 (×6): 50 mg via ORAL
  Filled 2021-12-07 (×6): qty 1

## 2021-12-07 MED ORDER — ROCURONIUM BROMIDE 10 MG/ML (PF) SYRINGE
PREFILLED_SYRINGE | INTRAVENOUS | Status: AC
  Filled 2021-12-07: qty 10

## 2021-12-07 MED ORDER — MICROFIBRILLAR COLL HEMOSTAT EX PADS
MEDICATED_PAD | CUTANEOUS | Status: DC | PRN
  Administered 2021-12-07: 1 via TOPICAL

## 2021-12-07 MED ORDER — ONDANSETRON HCL 4 MG/2ML IJ SOLN
INTRAMUSCULAR | Status: DC | PRN
  Administered 2021-12-07: 4 mg via INTRAVENOUS

## 2021-12-07 MED ORDER — KETAMINE HCL 10 MG/ML IJ SOLN
INTRAMUSCULAR | Status: DC | PRN
  Administered 2021-12-07: 10 mg via INTRAVENOUS
  Administered 2021-12-07: 20 mg via INTRAVENOUS
  Administered 2021-12-07: 10 mg via INTRAVENOUS

## 2021-12-07 MED ORDER — FENTANYL CITRATE (PF) 100 MCG/2ML IJ SOLN
INTRAMUSCULAR | Status: AC
  Filled 2021-12-07: qty 2

## 2021-12-07 MED ORDER — FENTANYL CITRATE PF 50 MCG/ML IJ SOSY
25.0000 ug | PREFILLED_SYRINGE | INTRAMUSCULAR | Status: DC | PRN
  Administered 2021-12-07 (×2): 50 ug via INTRAVENOUS

## 2021-12-07 MED ORDER — DIPHENHYDRAMINE HCL 50 MG/ML IJ SOLN
INTRAMUSCULAR | Status: AC
  Filled 2021-12-07: qty 1

## 2021-12-07 MED ORDER — BELLADONNA ALKALOIDS-OPIUM 16.2-60 MG RE SUPP: 1.0000 | Freq: Four times a day (QID) | RECTAL | Status: DC | PRN

## 2021-12-07 MED ORDER — SUGAMMADEX SODIUM 200 MG/2ML IV SOLN
INTRAVENOUS | Status: DC | PRN
Start: 2021-12-07 — End: 2021-12-07
  Administered 2021-12-07: 150 mg via INTRAVENOUS

## 2021-12-07 MED ORDER — LACTATED RINGERS IR SOLN
Status: DC | PRN
  Administered 2021-12-07: 1000 mL

## 2021-12-07 MED ORDER — LIDOCAINE HCL (PF) 2 % IJ SOLN
INTRAMUSCULAR | Status: AC
  Filled 2021-12-07: qty 5

## 2021-12-07 MED ORDER — CHLORHEXIDINE GLUCONATE CLOTH 2 % EX PADS: 6.0000 | MEDICATED_PAD | Freq: Once | CUTANEOUS | Status: DC

## 2021-12-07 MED ORDER — LABETALOL HCL 5 MG/ML IV SOLN
INTRAVENOUS | Status: DC | PRN
  Administered 2021-12-07: 5 mg via INTRAVENOUS

## 2021-12-07 MED ORDER — HYDROMORPHONE HCL 1 MG/ML IJ SOLN
INTRAMUSCULAR | Status: AC
  Administered 2021-12-07: 0.5 mg via INTRAVENOUS
  Filled 2021-12-07: qty 2

## 2021-12-07 MED ORDER — PANTOPRAZOLE SODIUM 40 MG PO TBEC
40.0000 mg | DELAYED_RELEASE_TABLET | Freq: Two times a day (BID) | ORAL | Status: DC | PRN
  Administered 2021-12-07 – 2021-12-09 (×3): 40 mg via ORAL
  Filled 2021-12-07 (×3): qty 1

## 2021-12-07 MED ORDER — SODIUM CHLORIDE (PF) 0.9 % IJ SOLN
INTRAMUSCULAR | Status: DC | PRN
  Administered 2021-12-07: 20 mL

## 2021-12-07 MED ORDER — HYDRALAZINE HCL 20 MG/ML IJ SOLN
10.0000 mg | INTRAMUSCULAR | Status: DC | PRN
  Administered 2021-12-07 (×2): 10 mg via INTRAVENOUS
  Filled 2021-12-07: qty 1

## 2021-12-07 MED ORDER — ACETAMINOPHEN 500 MG PO TABS
1000.0000 mg | ORAL_TABLET | Freq: Four times a day (QID) | ORAL | Status: AC
  Administered 2021-12-07 – 2021-12-08 (×4): 1000 mg via ORAL
  Filled 2021-12-07 (×4): qty 2

## 2021-12-07 MED ORDER — ALLOPURINOL 300 MG PO TABS
300.0000 mg | ORAL_TABLET | Freq: Every day | ORAL | Status: DC
  Administered 2021-12-08 – 2021-12-10 (×3): 300 mg via ORAL
  Filled 2021-12-07 (×3): qty 1

## 2021-12-07 MED ORDER — ROCURONIUM BROMIDE 100 MG/10ML IV SOLN
INTRAVENOUS | Status: DC | PRN
  Administered 2021-12-07: 20 mg via INTRAVENOUS
  Administered 2021-12-07: 70 mg via INTRAVENOUS

## 2021-12-07 MED ORDER — EPHEDRINE 5 MG/ML INJ
INTRAVENOUS | Status: AC
  Filled 2021-12-07: qty 5

## 2021-12-07 MED ORDER — CHLORHEXIDINE GLUCONATE 0.12 % MT SOLN
15.0000 mL | Freq: Once | OROMUCOSAL | Status: AC
  Administered 2021-12-07: 15 mL via OROMUCOSAL

## 2021-12-07 MED ORDER — FENTANYL CITRATE PF 50 MCG/ML IJ SOSY
PREFILLED_SYRINGE | INTRAMUSCULAR | Status: AC
  Administered 2021-12-07: 50 ug
  Filled 2021-12-07: qty 3

## 2021-12-07 MED ORDER — STERILE WATER FOR IRRIGATION IR SOLN
Status: DC | PRN
  Administered 2021-12-07: 1000 mL

## 2021-12-07 MED ORDER — DIPHENHYDRAMINE HCL 12.5 MG/5ML PO ELIX
12.5000 mg | ORAL_SOLUTION | Freq: Four times a day (QID) | ORAL | Status: DC | PRN
  Administered 2021-12-07 – 2021-12-08 (×3): 12.5 mg via ORAL
  Filled 2021-12-07 (×3): qty 5

## 2021-12-07 MED ORDER — PROPOFOL 10 MG/ML IV BOLUS
INTRAVENOUS | Status: DC | PRN
  Administered 2021-12-07: 130 mg via INTRAVENOUS

## 2021-12-07 MED ORDER — DEXAMETHASONE SODIUM PHOSPHATE 10 MG/ML IJ SOLN
INTRAMUSCULAR | Status: AC
  Filled 2021-12-07: qty 1

## 2021-12-07 MED ORDER — DIPHENHYDRAMINE HCL 50 MG/ML IJ SOLN: 12.5000 mg | Freq: Once | INTRAMUSCULAR | Status: DC

## 2021-12-07 MED ORDER — ORAL CARE MOUTH RINSE: 15.0000 mL | Freq: Once | OROMUCOSAL | Status: AC

## 2021-12-07 MED ORDER — ONDANSETRON HCL 4 MG/2ML IJ SOLN: 4.0000 mg | INTRAMUSCULAR | Status: DC | PRN

## 2021-12-07 MED ORDER — HYDROMORPHONE HCL 1 MG/ML IJ SOLN
0.2500 mg | INTRAMUSCULAR | Status: DC | PRN
  Administered 2021-12-07 (×3): 0.5 mg via INTRAVENOUS

## 2021-12-07 MED ORDER — CLONIDINE HCL 0.3 MG PO TABS
0.3000 mg | ORAL_TABLET | Freq: Two times a day (BID) | ORAL | Status: DC
  Administered 2021-12-07 – 2021-12-10 (×6): 0.3 mg via ORAL
  Filled 2021-12-07 (×6): qty 1

## 2021-12-07 MED ORDER — HYDROMORPHONE HCL 1 MG/ML IJ SOLN
0.5000 mg | INTRAMUSCULAR | Status: DC | PRN
Start: 2021-12-07 — End: 2021-12-09
  Administered 2021-12-07 – 2021-12-08 (×2): 1 mg via INTRAVENOUS
  Filled 2021-12-07 (×2): qty 1

## 2021-12-07 MED ORDER — DIPHENHYDRAMINE HCL 50 MG/ML IJ SOLN: 12.5000 mg | Freq: Four times a day (QID) | INTRAMUSCULAR | Status: DC | PRN

## 2021-12-07 MED ORDER — HYDRALAZINE HCL 20 MG/ML IJ SOLN
INTRAMUSCULAR | Status: AC
  Administered 2021-12-07: 10 mg via INTRAVENOUS
  Filled 2021-12-07: qty 1

## 2021-12-07 MED ORDER — SURGIFLO WITH THROMBIN (HEMOSTATIC MATRIX KIT) OPTIME
TOPICAL | Status: DC | PRN
  Administered 2021-12-07: 1 via TOPICAL

## 2021-12-07 SURGICAL SUPPLY — 65 items
APPLICATOR SURGIFLO ENDO (HEMOSTASIS) ×2 IMPLANT
BAG COUNTER SPONGE SURGICOUNT (BAG) IMPLANT
CHLORAPREP W/TINT 26 (MISCELLANEOUS) ×2 IMPLANT
CLIP LIGATING HEM O LOK PURPLE (MISCELLANEOUS) ×2 IMPLANT
CLIP LIGATING HEMO O LOK GREEN (MISCELLANEOUS) ×5 IMPLANT
CLIP SUT LAPRA TY ABSORB (SUTURE) ×4 IMPLANT
COVER SURGICAL LIGHT HANDLE (MISCELLANEOUS) ×2 IMPLANT
COVER TIP SHEARS 8 DVNC (MISCELLANEOUS) ×1 IMPLANT
COVER TIP SHEARS 8MM DA VINCI (MISCELLANEOUS) ×2
DECANTER SPIKE VIAL GLASS SM (MISCELLANEOUS) ×2 IMPLANT
DERMABOND ADVANCED (GAUZE/BANDAGES/DRESSINGS) ×1
DERMABOND ADVANCED .7 DNX12 (GAUZE/BANDAGES/DRESSINGS) ×1 IMPLANT
DRAIN CHANNEL 15F RND FF 3/16 (WOUND CARE) ×2 IMPLANT
DRAPE ARM DVNC X/XI (DISPOSABLE) ×4 IMPLANT
DRAPE COLUMN DVNC XI (DISPOSABLE) ×1 IMPLANT
DRAPE DA VINCI XI ARM (DISPOSABLE) ×8
DRAPE DA VINCI XI COLUMN (DISPOSABLE) ×2
DRAPE INCISE IOBAN 66X45 STRL (DRAPES) ×2 IMPLANT
DRAPE SHEET LG 3/4 BI-LAMINATE (DRAPES) ×2 IMPLANT
DRSG TEGADERM 4X4.75 (GAUZE/BANDAGES/DRESSINGS) ×2 IMPLANT
ELECT PENCIL ROCKER SW 15FT (MISCELLANEOUS) ×2 IMPLANT
ELECT REM PT RETURN 15FT ADLT (MISCELLANEOUS) ×2 IMPLANT
EVACUATOR SILICONE 100CC (DRAIN) ×2 IMPLANT
GLOVE SURG ENC MOIS LTX SZ6.5 (GLOVE) ×2 IMPLANT
GLOVE SURG ENC TEXT LTX SZ8 (GLOVE) ×4 IMPLANT
GOWN STRL REUS W/TWL LRG LVL3 (GOWN DISPOSABLE) ×6 IMPLANT
HEMOSTAT SURGICEL 4X8 (HEMOSTASIS) IMPLANT
HOLDER FOLEY CATH W/STRAP (MISCELLANEOUS) ×2 IMPLANT
IRRIG SUCT STRYKERFLOW 2 WTIP (MISCELLANEOUS) ×2
IRRIGATION SUCT STRKRFLW 2 WTP (MISCELLANEOUS) ×1 IMPLANT
KIT BASIN OR (CUSTOM PROCEDURE TRAY) ×2 IMPLANT
KIT TURNOVER KIT A (KITS) IMPLANT
LOOP VESSEL MAXI BLUE (MISCELLANEOUS) IMPLANT
MARKER SKIN DUAL TIP RULER LAB (MISCELLANEOUS) ×2 IMPLANT
NDL INSUFFLATION 14GA 120MM (NEEDLE) ×1 IMPLANT
NEEDLE INSUFFLATION 14GA 120MM (NEEDLE) ×2 IMPLANT
NS IRRIG 1000ML POUR BTL (IV SOLUTION) ×2 IMPLANT
POUCH SPECIMEN RETRIEVAL 10MM (ENDOMECHANICALS) ×2 IMPLANT
PROTECTOR NERVE ULNAR (MISCELLANEOUS) ×4 IMPLANT
RELOAD STAPLE 60 2.6 WHT THN (STAPLE) IMPLANT
RELOAD STAPLER WHITE 60MM (STAPLE) IMPLANT
SEAL CANN UNIV 5-8 DVNC XI (MISCELLANEOUS) ×4 IMPLANT
SEAL XI 5MM-8MM UNIVERSAL (MISCELLANEOUS) ×8
SET TUBE SMOKE EVAC HIGH FLOW (TUBING) ×2 IMPLANT
SOLUTION ELECTROLUBE (MISCELLANEOUS) ×2 IMPLANT
SPONGE T-LAP 4X18 ~~LOC~~+RFID (SPONGE) ×2 IMPLANT
STAPLE ECHEON FLEX 60 POW ENDO (STAPLE) IMPLANT
STAPLER RELOAD WHITE 60MM (STAPLE)
SURGIFLO W/THROMBIN 8M KIT (HEMOSTASIS) ×2 IMPLANT
SUT ETHILON 3 0 PS 1 (SUTURE) ×2 IMPLANT
SUT MNCRL AB 4-0 PS2 18 (SUTURE) ×4 IMPLANT
SUT VIC AB 0 CT1 27 (SUTURE) ×8
SUT VIC AB 0 CT1 27XBRD ANTBC (SUTURE) ×4 IMPLANT
SUT VIC AB 2-0 SH 27 (SUTURE) ×4
SUT VIC AB 2-0 SH 27X BRD (SUTURE) ×1 IMPLANT
SUT VLOC BARB 180 ABS3/0GR12 (SUTURE) ×2
SUTURE VLOC BRB 180 ABS3/0GR12 (SUTURE) ×1 IMPLANT
TOWEL OR 17X26 10 PK STRL BLUE (TOWEL DISPOSABLE) ×2 IMPLANT
TOWEL OR NON WOVEN STRL DISP B (DISPOSABLE) ×2 IMPLANT
TRAY FOLEY MTR SLVR 16FR STAT (SET/KITS/TRAYS/PACK) ×2 IMPLANT
TRAY LAPAROSCOPIC (CUSTOM PROCEDURE TRAY) ×2 IMPLANT
TROCAR BLADELESS OPT 5 100 (ENDOMECHANICALS) IMPLANT
TROCAR UNIVERSAL OPT 12M 100M (ENDOMECHANICALS) IMPLANT
TROCAR XCEL 12X100 BLDLESS (ENDOMECHANICALS) ×2 IMPLANT
WATER STERILE IRR 1000ML POUR (IV SOLUTION) ×2 IMPLANT

## 2021-12-07 NOTE — Transfer of Care (Signed)
Immediate Anesthesia Transfer of Care Note  Patient: Debra Vasquez  Procedure(s) Performed: XI ROBOTIC ASSITED PARTIAL NEPHRECTOMY (Left: Renal)  Patient Location: PACU  Anesthesia Type:General  Level of Consciousness: drowsy  Airway & Oxygen Therapy: Patient Spontanous Breathing and Patient connected to face mask oxygen  Post-op Assessment: Report given to RN and Post -op Vital signs reviewed and stable  Post vital signs: Reviewed and stable  Last Vitals:  Vitals Value Taken Time  BP 210/97 12/07/21 1556  Temp    Pulse 82 12/07/21 1557  Resp 18 12/07/21 1558  SpO2 100 % 12/07/21 1557  Vitals shown include unvalidated device data.  Last Pain:  Vitals:   12/07/21 1033  TempSrc:   PainSc: 0-No pain         Complications: No notable events documented.

## 2021-12-07 NOTE — Op Note (Signed)
Preoperative diagnosis: Left renal mass  Postop diagnosis: Same  Procedure: 1.  Left robot assisted laparoscopic radical partial nephrectomy 2.  Left renal cyst decortication  Attending: Nicolette Bang, MD  Assistant: Debbrah Alar, PA  Anesthesia: General  Estimated blood loss: 150 cc  Drains: 16 French Foley catheter, JP drain  Specimens:Left renal mass with overlying renal fat  Antibiotics: ancef  Findings: 7cm left lower pole renal cyst 1 artery and 1 vein. 4cm left upper pole renal mass. The assistant was utilized for suction, retraction, passing suture and cutting suture. 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  The mass was amenable to partial nephrectomy.  After discussing treatment options patient decided to proceed with left robot assisted laparoscopic partial nephrectomy.  Procedure in detail: Prior to procedure consent was obtained. Patient was brought to the operating room and briefing was done sure correct patient, correct procedure, correct site.  General anesthesia was in administered patient was placed in the right lateral decubitus position.  a 34 French catheter was placed. their abdomen and flank was then prepped and draped usual sterile fashion.  A Veress needle was used to obtain pneumoperitoneum.  Once pneumoperitoneum was reestablished to 15 mmHg we then placed a 8 mm camera port lateral to the umbilicus at the latera; edge of rectus.  We then proceeded to place 3 more robotic ports. We then placed an assistant port. We then docked the robot.  We then started this dissection by removing a small amount of anterior abdominal wall adhesions.  We then dissected along the white line of [place].  We then reflected the colon medially.  We then identified the psoas muscle.  Once this was done we traced it down to the iliac vessels and identified the ureter.  Once we  identified the gonadal vein and ureter were then traced this to the renal hilum.  The renal vein and renal artery were skeletonized.  We did we identified one renal vein one renal artery. We placed a vessel loop around the renal artery. We then identified the lower pole renal cyst and excised the cyst. We then cauterized the base of the cyst.  We then turned our attention to locating the renal mass. We proceeded to remove the overlying renal fat until we identified the renal mass. Once this was done with the circumscribed the mass with electrocautery. We then placed a bulldog clamp on the renal artery and this began our warm ischemia time. Using sharp dissection the mass was removed. Using a 2-0 V lock suture we oversewed the tumor bed. We then used 0 Vicryl with hem-o-locks in an interrupted fashion to reapproximate the defect in the kidney. Once this was done we removed the bulldog clamp and noted no residual bleeding.  We then placed the specimen in an Endo Catch bag.  Once the specimen was in the Endo Catch bag we then inspected the left kidney and noted no residual bleeding. We then placed a JP drain in the lower quadrant robot port. THis was secured with a 0 nylon.  We then removed our instruments, undocked the robot, and released the pneumoperitoneum. Once the specimen was removed we then closed the camera and assistant ports with 0 Vicryl in interrupted fashion.  The skin was then subcuticularly closed with 4-0 Monocryl.  We then placed Dermabond over all the incisions.  This included the procedure which resulted  by the patient.  Complications: None  Condition: Stable, x-rayed, transferred to PACU.  Plan: Patient is to be admitted for inpatient stay. The foley catheter will be removed in the morning. They will be started on a clear liquid diet POD#1

## 2021-12-07 NOTE — H&P (Signed)
Urology Admission H&P  Chief Complaint: left renal mass  History of Present Illness: Debra Vasquez is a 75yo here for evaluation of a left renal mass. 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 She has a left lower pole renal cyst. NO gross hematuria. No significant LUTS. No recent weight loss. No night sweats. no other complaints today  Past Medical History:  Diagnosis Date   Arthritis    CHF (congestive heart failure) (HCC)    Diabetes mellitus without complication ([place])    Dysphagia    Family history of adverse reaction to anesthesia    Mother had problems waking up   GERD (gastroesophageal reflux disease)    Gout    Hypertension    Renal mass, left    Past Surgical History:  Procedure Laterality Date   ABDOMINAL HYSTERECTOMY     CARPAL TUNNEL RELEASE Left    CATARACT EXTRACTION W/PHACO Right 09/13/2019   Procedure: CATARACT EXTRACTION PHACO AND INTRAOCULAR LENS PLACEMENT RIGHT EYE;  Surgeon: Baruch Goldmann, MD;  Location: [facility];  Service: Ophthalmology;  Laterality: Right;  right   CATARACT EXTRACTION W/PHACO Left 09/27/2019   Procedure: CATARACT EXTRACTION PHACO AND INTRAOCULAR LENS PLACEMENT LEFT EYE  (CDE: 8.04);  Surgeon: Baruch Goldmann, MD;  Location: [facility];  Service: Ophthalmology;  Laterality: Left;   ESOPHAGOGASTRODUODENOSCOPY N/A 11/18/2020   Procedure: ESOPHAGOGASTRODUODENOSCOPY (EGD);  Surgeon: Daneil Dolin, MD;  Location: [facility];  Service: Endoscopy;  Laterality: N/A;  7:30am   MALONEY DILATION N/A 11/18/2020   Procedure: Venia Minks DILATION;  Surgeon: Daneil Dolin, MD;  Location: [facility];  Service: Endoscopy;  Laterality: N/A;   TUBAL LIGATION     WISDOM TOOTH EXTRACTION      Home Medications:  Current Facility-Administered Medications  Medication Dose Route Frequency Provider Last Rate Last Admin   ceFAZolin (ANCEF) IVPB 2g/100 mL premix  2 g Intravenous 30 min Pre-Op Royanne Warshaw, Candee Furbish, MD        Chlorhexidine Gluconate Cloth 2 % PADS 6 each  6 each Topical Once Sami Froh, Candee Furbish, MD       lactated ringers infusion   Intravenous Continuous Catalina Gravel, MD 10 mL/hr at 12/07/21 1041 New Bag at 12/07/21 1041   Allergies:  Allergies  Allergen Reactions   Gabapentin Other (See Comments)    syncope   Capoten [Captopril] Cough   Celexa [Citalopram Hydrobromide]     Passed out   Hydrocodone     "awake all night", felt terrible   Lexapro [Escitalopram Oxalate]     "see psychodelic lights"   Lipitor [Atorvastatin Calcium] Other (See Comments)    Achy legs   Tramadol     confusion    Family History  Problem Relation Age of Onset   Hyperlipidemia Mother    Heart attack Mother    Parkinson's disease Father    Stroke Father 98   Hyperlipidemia Sister    Alcoholism Brother    Diabetes Brother    Heart failure Paternal Grandmother    Colon cancer Neg Hx    Gastric cancer Neg Hx    Esophageal cancer Neg Hx    Liver disease Neg Hx    Pancreatic disease Neg Hx    Social History:  reports that she quit smoking about 36 years ago. Her smoking use included cigarettes. She has a 3.00 pack-year smoking history. She has never used smokeless tobacco. She reports that she does not currently  use alcohol. She reports that she does not use drugs.  Review of Systems  All other systems reviewed and are negative.  Physical Exam:  Vital signs in last 24 hours: Temp:  [98 F (36.7 C)] 98 F (36.7 C) (12/13 1015) Pulse Rate:  [58] 58 (12/13 1015) Resp:  [18] 18 (12/13 1015) BP: (169)/(71) 169/71 (12/13 1015) SpO2:  [100 %] 100 % (12/13 1015) Weight:  [73.9 kg] 73.9 kg (12/13 1015) Physical Exam Constitutional:      Appearance: Normal appearance.  HENT:     Head: Normocephalic and atraumatic.     Nose: No congestion.     Mouth/Throat:     Mouth: Mucous membranes are dry.  Eyes:     Extraocular Movements: Extraocular movements intact.     Pupils: Pupils are equal, round,  and reactive to light.  Cardiovascular:     Rate and Rhythm: Normal rate and regular rhythm.  Pulmonary:     Effort: Pulmonary effort is normal. No respiratory distress.  Abdominal:     General: Abdomen is flat. There is no distension.  Musculoskeletal:        General: No swelling. Normal range of motion.     Cervical back: Normal range of motion and neck supple.  Skin:    General: Skin is warm and dry.  Neurological:     General: No focal deficit present.     Mental Status: She is alert and oriented to person, place, and time.  Psychiatric:        Mood and Affect: Mood normal.        Behavior: Behavior normal.        Thought Content: Thought content normal.        Judgment: Judgment normal.    Laboratory Data:  Results for orders placed or performed during the hospital encounter of 12/07/21 (from the past 24 hour(s))  Glucose, capillary     Status: Abnormal   Collection Time: 12/07/21 10:26 AM  Result Value Ref Range   Glucose-Capillary 118 (H) 70 - 99 mg/dL   Comment 1 Notify RN   ABO/Rh     Status: None (Preliminary result)   Collection Time: 12/07/21 10:38 AM  Result Value Ref Range   ABO/RH(D) PENDING    Recent Results (from the past 240 hour(s))  SARS Coronavirus 2 (TAT 6-24 hrs)     Status: None   Collection Time: 12/03/21 12:00 AM  Result Value Ref Range Status   SARS Coronavirus 2 RESULT: NEGATIVE  Final    Comment: RESULT: NEGATIVESARS-CoV-2 INTERPRETATION:A NEGATIVE  test result means that SARS-CoV-2 RNA was not present in the specimen above the limit of detection of this test. This does not preclude a possible SARS-CoV-2 infection and should not be used as the  sole basis for patient management decisions. Negative results must be combined with clinical observations, patient history, and epidemiological information. Optimum specimen types and timing for peak viral levels during infections caused by SARS-CoV-2  have not been determined. Collection of multiple  specimens or types of specimens may be necessary to detect virus. Improper specimen collection and handling, sequence variability under primers/probes, or organism present below the limit of detection may  lead to false negative results. Positive and negative predictive values of testing are highly dependent on prevalence. False negative test results are more likely when prevalence of disease is high.The expected result is NEGATIVE.Fact S heet for  Healthcare Providers: LocalChronicle.no Sheet for Patients: SalonLookup.es Reference Range - Negative  Creatinine: No results for input(s): CREATININE in the last 168 hours. Baseline Creatinine: unknown  Impression/Assessment:  75yo with a left renal mass  Plan:  We discussed the natural hx of renal masses and the 80/20 malignant/benign likelihood. We disucssed the treatment options including active surveillance. Renal ablation, partial and radical nephrectomy. After discussing the options the patient elects for robotic left partial nephrectomy.   Nicolette Bang 12/07/2021, 11:11 AM

## 2021-12-07 NOTE — Discharge Instructions (Signed)

## 2021-12-07 NOTE — Anesthesia Preprocedure Evaluation (Addendum)
Anesthesia Evaluation  Patient identified by MRN, date of birth, ID band Patient awake    Reviewed: Allergy & Precautions, NPO status , Patient's Chart, lab work & pertinent test results, reviewed documented beta blocker date and time   Airway Mallampati: III  TM Distance: >3 FB Neck ROM: Full    Dental  (+) Edentulous Upper, Dental Advisory Given, Missing,    Pulmonary neg pulmonary ROS, former smoker,    Pulmonary exam normal breath sounds clear to auscultation       Cardiovascular hypertension, Pt. on medications and Pt. on home beta blockers +CHF  Normal cardiovascular exam Rhythm:Regular Rate:Normal  Stress Test 2021 Unremarkable  ECG 07/25/19 Sinus bradycardia  Echo 07/05/19 Normal LV function, normal RV function, aortic sclerosis  Stress test 07/05/19 [facility] SPECT. Normal study  Cardiac catheterization 2014 [facility]) "widely patent normal coronaries"     Neuro/Psych negative neurological ROS  negative psych ROS   GI/Hepatic Neg liver ROS, GERD  ,  Endo/Other  diabetes, Type 2, Oral Hypoglycemic Agents  Renal/GU negative Renal ROS  negative genitourinary   Musculoskeletal negative musculoskeletal ROS (+)   Abdominal   Peds  Hematology negative hematology ROS (+)   Anesthesia Other Findings   Reproductive/Obstetrics                           Anesthesia Physical Anesthesia Plan  ASA: 3  Anesthesia Plan: General   Post-op Pain Management: Tylenol PO (pre-op) and Ketamine IV   Induction: Intravenous  PONV Risk Score and Plan: 3 and Midazolam, Dexamethasone and Ondansetron  Airway Management Planned: Oral ETT  Additional Equipment:   Intra-op Plan:   Post-operative Plan: Extubation in OR  Informed Consent: I have reviewed the patients History and Physical, chart, labs and discussed the procedure including the risks, benefits and alternatives for the proposed  anesthesia with the patient or authorized representative who has indicated his/her understanding and acceptance.     Dental advisory given  Plan Discussed with: CRNA  Anesthesia Plan Comments:        Anesthesia Quick Evaluation

## 2021-12-07 NOTE — Anesthesia Postprocedure Evaluation (Signed)
Anesthesia Post Note  Patient: Debra Vasquez  Procedure(s) Performed: XI ROBOTIC ASSITED PARTIAL NEPHRECTOMY (Left: Renal)     Patient location during evaluation: PACU Anesthesia Type: General Level of consciousness: awake Pain management: pain level controlled Vital Signs Assessment: post-procedure vital signs reviewed and stable Respiratory status: spontaneous breathing and respiratory function stable Cardiovascular status: stable Postop Assessment: no apparent nausea or vomiting Anesthetic complications: no   No notable events documented.  Last Vitals:  Vitals:   12/07/21 1635 12/07/21 1743  BP: (!) 194/108 (!) 149/76  Pulse:  (!) 105  Resp:  10  Temp:    SpO2:      Last Pain:  Vitals:   12/07/21 1033  TempSrc:   PainSc: 0-No pain                 Merlinda Frederick

## 2021-12-07 NOTE — Anesthesia Procedure Notes (Signed)
Procedure Name: Intubation Date/Time: 12/07/2021 12:23 PM Performed by: [place] Carlie Solorzano C, CRNA Pre-anesthesia Checklist: Patient identified, Emergency Drugs available, Suction available and Patient being monitored Patient Re-evaluated:Patient Re-evaluated prior to induction Oxygen Delivery Method: Circle system utilized Preoxygenation: Pre-oxygenation with 100% oxygen Induction Type: IV induction Ventilation: Mask ventilation without difficulty Laryngoscope Size: Mac and 3 Grade View: Grade I Tube type: Oral Tube size: 7.0 mm Number of attempts: 1 Airway Equipment and Method: Stylet and Oral airway Placement Confirmation: ETT inserted through vocal cords under direct vision, positive ETCO2 and breath sounds checked- equal and bilateral Secured at: 20 cm Tube secured with: Tape Dental Injury: Teeth and Oropharynx as per pre-operative assessment

## 2021-12-07 NOTE — Progress Notes (Signed)
Received pt from PACU, in stable condition, pt awake and crying stating increased pain. Pt restless in bed. Son at beside, pt drank sips of gingerale and pain med administered as ordered. JP site D/I,80 ml emptied from drain. SRP, RN

## 2021-12-08 ENCOUNTER — Encounter (HOSPITAL_COMMUNITY): Payer: Self-pay | Admitting: Urology

## 2021-12-08 LAB — HEMOGLOBIN AND HEMATOCRIT, BLOOD
HCT: 33.1 % — ABNORMAL LOW (ref 36.0–46.0)
HCT: 33.4 % — ABNORMAL LOW (ref 36.0–46.0)
Hemoglobin: 10.8 g/dL — ABNORMAL LOW (ref 12.0–15.0)
Hemoglobin: 10.7 g/dL — ABNORMAL LOW (ref 12.0–15.0)

## 2021-12-08 LAB — BASIC METABOLIC PANEL
Anion gap: 9 (ref 5–15)
BUN: 21 mg/dL (ref 8–23)
CO2: 24 mmol/L (ref 22–32)
Calcium: 8.3 mg/dL — ABNORMAL LOW (ref 8.9–10.3)
Chloride: 101 mmol/L (ref 98–111)
Creatinine, Ser: 1.39 mg/dL — ABNORMAL HIGH (ref 0.44–1.00)
GFR, Estimated: 40 mL/min — ABNORMAL LOW (ref >60)
Glucose, Bld: 192 mg/dL — ABNORMAL HIGH (ref 70–99)
Potassium: 4.1 mmol/L (ref 3.5–5.1)
Sodium: 134 mmol/L — ABNORMAL LOW (ref 135–145)

## 2021-12-08 LAB — CREATININE, FLUID (PLEURAL, PERITONEAL, JP DRAINAGE): Creat, Fluid: 1.3 mg/dL

## 2021-12-08 LAB — GLUCOSE, CAPILLARY
Glucose-Capillary: 154 mg/dL — ABNORMAL HIGH (ref 70–99)
Glucose-Capillary: 149 mg/dL — ABNORMAL HIGH (ref 70–99)
Glucose-Capillary: 171 mg/dL — ABNORMAL HIGH (ref 70–99)
Glucose-Capillary: 159 mg/dL — ABNORMAL HIGH (ref 70–99)
Glucose-Capillary: 179 mg/dL — ABNORMAL HIGH (ref 70–99)

## 2021-12-08 MED ORDER — CHLORHEXIDINE GLUCONATE CLOTH 2 % EX PADS
6.0000 | MEDICATED_PAD | Freq: Every day | CUTANEOUS | Status: DC
  Administered 2021-12-08: 11:00:00 6 via TOPICAL

## 2021-12-08 MED ORDER — ORAL CARE MOUTH RINSE
15.0000 mL | Freq: Two times a day (BID) | OROMUCOSAL | Status: DC
  Administered 2021-12-08 – 2021-12-10 (×3): 15 mL via OROMUCOSAL

## 2021-12-08 NOTE — TOC Progression Note (Signed)
Transition of Care [facility]) - Progression Note    Patient Details  Name: Debra Vasquez MRN: [id number] Date of Birth: Jul 02, 1946  [facility]) CM/SW Contact  Purcell Mouton, RN Phone Number: 12/08/2021, 2:41 PM  Clinical Narrative:    Pt from home and plan to return home with no needs at present time.  Expected Discharge Plan: Home/Self Care Barriers to Discharge: No Barriers Identified  Expected Discharge Plan and Services Expected Discharge Plan: Home/Self Care       Living arrangements for the past 2 months: Single Family Home                                       Social Determinants of Health (SDOH) Interventions    Readmission Risk Interventions No flowsheet data found.

## 2021-12-08 NOTE — Progress Notes (Signed)
Bladder irrigated with 60 mL NS.  Clear urine returned.  Patient tolerated well.  Angie Fava, RN

## 2021-12-08 NOTE — Progress Notes (Signed)
1 Day Post-Op Subjective: Patient reports pain control good.  She has slept well.  No N/V.  No flatus.  She is complaining of itching on her chin and neck.   Objective: Vital signs in last 24 hours: Temp:  [97.5 F (36.4 C)-99.3 F (37.4 C)] 97.5 F (36.4 C) (12/14 0545) Pulse Rate:  [58-105] 62 (12/14 0545) Resp:  [7-18] 16 (12/14 0545) BP: (120-243)/(64-133) 120/66 (12/14 0545) SpO2:  [93 %-100 %] 97 % (12/14 0545) Weight:  [73.9 kg] 73.9 kg (12/13 1015)  Intake/Output from previous day: 12/13 0701 - 12/14 0700 In: 3376.6 [P.O.:60; I.V.:3186.6; IV Piggyback:100] Out: 640 [Urine:350; Drains:140; Blood:150] Intake/Output this shift: Total I/O In: -  Out: 50 [Drains:50]  Physical Exam:  General:alert, cooperative, and no distress Cardiovascular: RRR Lungs: No labored breathing  GI: soft, appropriately tender, faint BS; ND Incisions: C/D/I; mild ecchymosis surrounding incisions Urine: Clear Extremities:SCDs in place   Lab Results: Recent Labs    12/07/21 1912 12/08/21 0409  HGB 12.9 10.8*  HCT 39.2 33.1*   BMET Recent Labs    12/08/21 0409  NA 134*  K 4.1  CL 101  CO2 24  GLUCOSE 192*  BUN 21  CREATININE 1.39*  CALCIUM 8.3*   No results for input(s): LABPT, INR in the last 72 hours. No results for input(s): LABURIN in the last 72 hours. Results for orders placed or performed in visit on 12/03/21  SARS Coronavirus 2 (TAT 6-24 hrs)     Status: None   Collection Time: 12/03/21 12:00 AM  Result Value Ref Range Status   SARS Coronavirus 2 RESULT: NEGATIVE  Final    Comment: RESULT: NEGATIVESARS-CoV-2 INTERPRETATION:A NEGATIVE  test result means that SARS-CoV-2 RNA was not present in the specimen above the limit of detection of this test. This does not preclude a possible SARS-CoV-2 infection and should not be used as the  sole basis for patient management decisions. Negative results must be combined with clinical observations, patient history, and  epidemiological information. Optimum specimen types and timing for peak viral levels during infections caused by SARS-CoV-2  have not been determined. Collection of multiple specimens or types of specimens may be necessary to detect virus. Improper specimen collection and handling, sequence variability under primers/probes, or organism present below the limit of detection may  lead to false negative results. Positive and negative predictive values of testing are highly dependent on prevalence. False negative test results are more likely when prevalence of disease is high.The expected result is NEGATIVE.Fact S heet for  Healthcare Providers: LocalChronicle.no Sheet for Patients: SalonLookup.es Reference Range - Negative     Studies/Results: No results found.  Assessment/Plan: 1 Day Post-Op, Procedure(s) (LRB): XI ROBOTIC ASSITED PARTIAL NEPHRECTOMY (Left)  Ambulate, Incentive spirometry DVT prophylaxis Check drain creatinine level Low UO overnight--irrigate foley and increase IVF; monitor closely Leave foley until UO improves and review of JP Cr Leave diet at clears until ambulation and flatus Drop in H/H--Recheck this afternoon and tomorrow Bump in Cr expected.  Recheck in am Monitor itching.  No rash or skin irritation. Pt has very dry, flaky skin.  Can apply lotion to see if she gets relief.    LOS: 1 day   Debbrah Alar 12/08/2021, 8:27 AM

## 2021-12-09 LAB — BASIC METABOLIC PANEL
Anion gap: 7 (ref 5–15)
BUN: 21 mg/dL (ref 8–23)
CO2: 23 mmol/L (ref 22–32)
Calcium: 7.7 mg/dL — ABNORMAL LOW (ref 8.9–10.3)
Chloride: 97 mmol/L — ABNORMAL LOW (ref 98–111)
Creatinine, Ser: 1.31 mg/dL — ABNORMAL HIGH (ref 0.44–1.00)
GFR, Estimated: 43 mL/min — ABNORMAL LOW (ref >60)
Glucose, Bld: 137 mg/dL — ABNORMAL HIGH (ref 70–99)
Potassium: 4.1 mmol/L (ref 3.5–5.1)
Sodium: 127 mmol/L — ABNORMAL LOW (ref 135–145)

## 2021-12-09 LAB — GLUCOSE, CAPILLARY
Glucose-Capillary: 126 mg/dL — ABNORMAL HIGH (ref 70–99)
Glucose-Capillary: 206 mg/dL — ABNORMAL HIGH (ref 70–99)
Glucose-Capillary: 127 mg/dL — ABNORMAL HIGH (ref 70–99)
Glucose-Capillary: 119 mg/dL — ABNORMAL HIGH (ref 70–99)

## 2021-12-09 LAB — HEMOGLOBIN AND HEMATOCRIT, BLOOD
HCT: 29.2 % — ABNORMAL LOW (ref 36.0–46.0)
Hemoglobin: 9.6 g/dL — ABNORMAL LOW (ref 12.0–15.0)

## 2021-12-09 MED ORDER — BISACODYL 10 MG RE SUPP
10.0000 mg | Freq: Once | RECTAL | Status: DC
  Filled 2021-12-09: qty 1

## 2021-12-09 NOTE — Progress Notes (Signed)
Patient's diet advanced to regular.  Ate 25% of sandwich, 50% of sweet potato.  Since Foley removed, voided x2 (once unmeasured, another time 100 mL) clear yellow liquid.  Ambulated x2, >200 ft each time, with walker.  Tolerated well.  Angie Fava, RN

## 2021-12-09 NOTE — Progress Notes (Signed)
2 Days Post-Op Subjective: Patient reports pain last night but somewhat better this am.  Passing flatus.  No N/V. UO improved with increased IVF.  JP output appropriate and drainage Cr consistent with serum.  H/H slight drop again.  Cr stable.   Objective: Vital signs in last 24 hours: Temp:  [97.6 F (36.4 C)-98.6 F (37 C)] 98 F (36.7 C) (12/15 0514) Pulse Rate:  [57-59] 59 (12/15 0514) Resp:  [12-20] 20 (12/14 1704) BP: (116-138)/(64-76) 122/65 (12/15 0514) SpO2:  [91 %-96 %] 91 % (12/15 0514)  Intake/Output from previous day: 12/14 0701 - 12/15 0700 In: 4914.6 [P.O.:840; I.V.:4074.6] Out: 2735 [Urine:2550; Drains:185] Intake/Output this shift: Total I/O In: -  Out: 40 [Drains:40]  Physical Exam:  General:alert, cooperative, and no distress Cardiovascular: RRR Lungs: non labored breathing GI: soft; ND; appropriately tender; soft BS Incisions: C/D/I Urine:clear Extremities:SCDS in place   Lab Results: Recent Labs    12/08/21 0409 12/08/21 1355 12/09/21 0405  HGB 10.8* 10.7* 9.6*  HCT 33.1* 33.4* 29.2*   BMET Recent Labs    12/08/21 0409 12/09/21 0405  NA 134* 127*  K 4.1 4.1  CL 101 97*  CO2 24 23  GLUCOSE 192* 137*  BUN 21 21  CREATININE 1.39* 1.31*  CALCIUM 8.3* 7.7*   No results for input(s): LABPT, INR in the last 72 hours. No results for input(s): LABURIN in the last 72 hours. Results for orders placed or performed in visit on 12/03/21  SARS Coronavirus 2 (TAT 6-24 hrs)     Status: None   Collection Time: 12/03/21 12:00 AM  Result Value Ref Range Status   SARS Coronavirus 2 RESULT: NEGATIVE  Final    Comment: RESULT: NEGATIVESARS-CoV-2 INTERPRETATION:A NEGATIVE  test result means that SARS-CoV-2 RNA was not present in the specimen above the limit of detection of this test. This does not preclude a possible SARS-CoV-2 infection and should not be used as the  sole basis for patient management decisions. Negative results must be combined with  clinical observations, patient history, and epidemiological information. Optimum specimen types and timing for peak viral levels during infections caused by SARS-CoV-2  have not been determined. Collection of multiple specimens or types of specimens may be necessary to detect virus. Improper specimen collection and handling, sequence variability under primers/probes, or organism present below the limit of detection may  lead to false negative results. Positive and negative predictive values of testing are highly dependent on prevalence. False negative test results are more likely when prevalence of disease is high.The expected result is NEGATIVE.Fact S heet for  Healthcare Providers: LocalChronicle.no Sheet for Patients: SalonLookup.es Reference Range - Negative     Studies/Results: No results found.  Assessment/Plan: 2 Days Post-Op, Procedure(s) (LRB): XI ROBOTIC ASSITED PARTIAL NEPHRECTOMY (Left)  Improving H/H drop likely hemodilutional Ambulate, Incentive spirometry DVT prophylaxis Transition to PO pain medications D/C perinephric drain SL IVF Advance diet D/c foley with TOV Dulcolax supp Poss d/c home later today   LOS: 2 days   Debbrah Alar 12/09/2021, 8:08 AM

## 2021-12-09 NOTE — Progress Notes (Signed)
Patient ate 75% of full liquid breakfast (grits, orange serbert).  Patient had 1 occurrence incontinent diarrhea, type 6/7.  Refused laxative and suppository.  Angie Fava, RN

## 2021-12-09 NOTE — Progress Notes (Signed)
Foley catheter and JP drain removed at 0815 per order.  Voiding trial begun.  IV fluids discontinued.  Diet advanced to full liquid.  Patient ambulated 100 ft in hallway with walker, tolerated well, no dizziness.  Up in chair, tolerating well.  Angie Fava, RN

## 2021-12-10 LAB — CBC
HCT: 31 % — ABNORMAL LOW (ref 36.0–46.0)
Hemoglobin: 10.5 g/dL — ABNORMAL LOW (ref 12.0–15.0)
MCH: 28.9 pg (ref 26.0–34.0)
MCHC: 33.9 g/dL (ref 30.0–36.0)
MCV: 85.4 fL (ref 80.0–100.0)
Platelets: 134 10*3/uL — ABNORMAL LOW (ref 150–400)
RBC: 3.63 MIL/uL — ABNORMAL LOW (ref 3.87–5.11)
RDW: 14.9 % (ref 11.5–15.5)
WBC: 11.1 10*3/uL — ABNORMAL HIGH (ref 4.0–10.5)
nRBC: 0 % (ref 0.0–0.2)

## 2021-12-10 LAB — GLUCOSE, CAPILLARY: Glucose-Capillary: 130 mg/dL — ABNORMAL HIGH (ref 70–99)

## 2021-12-10 LAB — SURGICAL PATHOLOGY

## 2021-12-10 NOTE — Care Management Important Message (Signed)
Important Message  Patient Details IM Letter given to the Patient. Name: Debra Vasquez MRN: [id number] Date of Birth: 02/03/1946   Medicare Important Message Given:  Yes     Kerin Salen 12/10/2021, 11:43 AM

## 2021-12-10 NOTE — Progress Notes (Signed)
AVS given to patient and explained at the bedside. Medications and follow up appointments have been explained with pt verbalizing understanding.  

## 2021-12-17 ENCOUNTER — Ambulatory Visit: Payer: Medicare Other | Admitting: Urology

## 2021-12-22 ENCOUNTER — Encounter: Payer: Self-pay | Admitting: Urology

## 2021-12-22 ENCOUNTER — Other Ambulatory Visit: Payer: Self-pay

## 2021-12-22 ENCOUNTER — Ambulatory Visit (INDEPENDENT_AMBULATORY_CARE_PROVIDER_SITE_OTHER): Payer: Medicare Other | Admitting: Urology

## 2021-12-22 VITALS — BP 132/77 | HR 57

## 2021-12-22 DIAGNOSIS — C642 Malignant neoplasm of left kidney, except renal pelvis: Secondary | ICD-10-CM

## 2021-12-22 DIAGNOSIS — N2889 Other specified disorders of kidney and ureter: Secondary | ICD-10-CM | POA: Diagnosis not present

## 2021-12-22 NOTE — Patient Instructions (Signed)
Kidney Cancer ?Kidney cancer is an abnormal growth of cells in one or both kidneys. The kidneys filter waste from your blood and produce urine. Kidney cancer may spread to other parts of your body. This type of cancer may also be called renal cell carcinoma. ?What are the causes? ?The cause of this condition is not always known. In some cases, abnormal changes to genes (genetic mutations) can cause cells to form cancer. ?What increases the risk? ?You may be more likely to develop kidney cancer if you: ?Are over age 60. The risk increases with age. ?Have a family history of kidney cancer. ?Are of African-American, Native American, or Native [place] descent. ?Smoke. ?Are female. ?Are obese. ?Have high blood pressure (hypertension). ?Have advanced kidney disease, especially if you need long-term dialysis. ?Have certain conditions that are passed from parent to child (inherited), such as von Hippel-Lindau disease, tuberous sclerosis, or hereditary papillary renal carcinoma. ?Have been exposed to certain chemicals. ?What are the signs or symptoms? ?In the early stages, kidney cancer does not cause symptoms. As the cancer grows, symptoms may include: ?Blood in the urine. ?Pain in the upper back or abdomen, just below the rib cage. You may feel pain on one or both sides of the body. ?Fatigue. ?Unexplained weight loss. ?Fever. ?How is this diagnosed? ?This condition may be diagnosed based on: ?Your symptoms and medical history. ?A physical exam. ?Blood and urine tests. ?X-rays. ?Imaging tests, such as CT scans, MRIs, and PET scans. ?Having dye injected into your blood through an IV, and then having X-rays taken of: ?Your kidneys and the rest of the organs involved in making and storing urine (intravenous pyelogram). ?Your blood vessels (angiogram). ?Removal and testing of a kidney tissue sample (biopsy). ?Your cancer will be assessed (staged), based on how severe it is and how much it has spread. ?How is this  treated? ?Treatment depends on the type and stage of the cancer. Treatment may include one or more of the following: ?Surgery. This may include surgery to remove: ?Just the tumor (nephron-sparing surgery). ?The entire kidney (nephrectomy). ?The kidney, some of the surrounding healthy tissue, nearby lymph nodes, and the adrenal gland in certain cases (radical nephrectomy). ?Medicines that kill cancer cells (chemotherapy). ?High-energy rays that kill cancer cells (radiation therapy). ?Targeted therapy. This targets specific parts of cancer cells and the area around them to block the growth and the spread of the cancer. Targeted therapy can help to limit the damage to healthy cells. ?Medicines that help your body's disease-fighting system (immune system) fight cancer cells (immunotherapy). ?Freezing cancer cells using gas or liquid that is delivered through a needle (cryoablation). ?Destroying cancer cells using high-energy radio waves that are delivered through a needle-like probe (radiofrequency ablation). ?A procedure to block the artery that supplies blood to the tumor, which kills the cancer cells (embolization). ?Follow these instructions at home: ?Eating and drinking ?Some of your treatments might affect your appetite and your ability to chew and swallow. If you are having problems eating, or if you do not have an appetite, meet with a diet and nutrition specialist (dietitian). ?If you have side effects that affect eating, it may help to: ?Eat smaller meals and snacks often. ?Drink high-nutrition and high-calorie shakes or supplements. ?Eat bland and soft foods that are easy to eat. ?Not eat foods that are hot, spicy, or hard to swallow. ?Lifestyle ?Do not drink alcohol. ?Do not use any products that contain nicotine or tobacco, such as cigarettes and e-cigarettes. If you need help   quitting, ask your health care provider. ?General instructions ? ?Take over-the-counter and prescription medicines only as told by  your health care provider. This includes vitamins, supplements, and herbal products. ?Consider joining a support group to help you cope with the stress of having kidney cancer. ?Work with your health care provider to manage any side effects of treatment. ?Keep all follow-up visits as told by your health care provider. This is important. ?Where to find more information ?American Cancer Society: https://www.cancer.org ?National Cancer Institute (NCI): https://www.cancer.gov ?Contact a health care provider if you: ?Notice that you bruise or bleed easily. ?Are losing weight without trying. ?Have new or increased fatigue or weakness. ?Get help right away if you have: ?Blood in your urine. ?A sudden increase in pain. ?A fever. ?Shortness of breath. ?Chest pain. ?Yellow skin or whites of your eyes (jaundice). ?Summary ?Kidney cancer is an abnormal growth of cells (tumor) in one or both kidneys. Tumors may spread to other parts of your body. ?In the early stages, kidney cancer does not cause symptoms. As the cancer grows, symptoms may include blood in the urine, pain in the upper back or abdomen, unexplained weight loss, fatigue, and fever. ?Treatment depends on the type and stage of the cancer. It may include surgery to remove the tumor, procedures and medicines to kill the cancer cells, or medicines to help your body fight cancer cells. ?This information is not intended to replace advice given to you by your health care provider. Make sure you discuss any questions you have with your health care provider. ?Document Revised: 08/03/2021 Document Reviewed: 08/03/2021 ?Elsevier Patient Education ? 2022 Elsevier Inc. ? ?

## 2021-12-22 NOTE — Progress Notes (Signed)
Post op visit

## 2021-12-22 NOTE — Progress Notes (Signed)
12/22/2021 11:30 AM   Debra Vasquez 1946-02-24 [id number]  Referring provider: Caryl Bis, MD [place],  Debra Vasquez [id number]  Followup left renal mass   HPI: Debra Vasquez is a 75yo here for followup after left partial nephrectomy. Pathology T3a with negative margins. She is urinating well, no gross hematuria. Mild abdominal pain at incision. No constipation.   PMH: Past Medical History:  Diagnosis Date   Arthritis    CHF (congestive heart failure) (HCC)    Diabetes mellitus without complication ([place])    Dysphagia    Family history of adverse reaction to anesthesia    Mother had problems waking up   GERD (gastroesophageal reflux disease)    Gout    Hypertension    Renal mass, left     Surgical History: Past Surgical History:  Procedure Laterality Date   ABDOMINAL HYSTERECTOMY     CARPAL TUNNEL RELEASE Left    CATARACT EXTRACTION W/PHACO Right 09/13/2019   Procedure: CATARACT EXTRACTION PHACO AND INTRAOCULAR LENS PLACEMENT RIGHT EYE;  Surgeon: Baruch Goldmann, MD;  Location: [facility];  Service: Ophthalmology;  Laterality: Right;  right   CATARACT EXTRACTION W/PHACO Left 09/27/2019   Procedure: CATARACT EXTRACTION PHACO AND INTRAOCULAR LENS PLACEMENT LEFT EYE  (CDE: 8.04);  Surgeon: Baruch Goldmann, MD;  Location: [facility];  Service: Ophthalmology;  Laterality: Left;   ESOPHAGOGASTRODUODENOSCOPY N/A 11/18/2020   Procedure: ESOPHAGOGASTRODUODENOSCOPY (EGD);  Surgeon: Daneil Dolin, MD;  Location: [facility];  Service: Endoscopy;  Laterality: N/A;  7:30am   MALONEY DILATION N/A 11/18/2020   Procedure: Venia Minks DILATION;  Surgeon: Daneil Dolin, MD;  Location: [facility];  Service: Endoscopy;  Laterality: N/A;   ROBOTIC ASSITED PARTIAL NEPHRECTOMY Left 12/07/2021   Procedure: XI ROBOTIC ASSITED PARTIAL NEPHRECTOMY;  Surgeon: Cleon Gustin, MD;  Location: [facility];  Service: Urology;  Laterality: Left;   TUBAL LIGATION     WISDOM TOOTH EXTRACTION       Home Medications:  Allergies as of 12/22/2021       Reactions   Gabapentin Other (See Comments)   syncope   Capoten [captopril] Cough   Celexa [citalopram Hydrobromide]    Passed out   Hydrocodone    "awake all night", felt terrible   Lexapro [escitalopram Oxalate]    "see psychodelic lights"   Lipitor [atorvastatin Calcium] Other (See Comments)   Achy legs   Tramadol    confusion        Medication List        Accurate as of December 22, 2021 11:30 AM. If you have any questions, ask your nurse or doctor.          allopurinol 300 MG tablet Commonly known as: ZYLOPRIM Take 300 mg by mouth in the morning.   cloNIDine 0.3 MG tablet Commonly known as: CATAPRES Take 0.3 mg by mouth 2 (two) times daily.   docusate sodium 100 MG capsule Commonly known as: COLACE Take 1 capsule (100 mg total) by mouth 2 (two) times daily.   furosemide 20 MG tablet Commonly known as: LASIX Take 20 mg by mouth daily as needed for edema.   glipiZIDE 5 MG 24 hr tablet Commonly known as: GLUCOTROL XL Take 5 mg by mouth in the morning.   Magnesium 400 MG Tabs Take 800 mg by mouth daily.   metFORMIN 500 MG 24 hr tablet Commonly known as: GLUCOPHAGE-XR Take 500 mg by mouth at bedtime.   metoprolol tartrate 50 MG tablet Commonly  known as: LOPRESSOR Take 50 mg by mouth 2 (two) times daily.   oxyCODONE 5 MG immediate release tablet Commonly known as: Roxicodone Take 1-2 tablets (5-10 mg total) by mouth every 6 (six) hours as needed for moderate pain.   pantoprazole 40 MG tablet Commonly known as: PROTONIX TAKE 1 TABLET(40 MG) BY MOUTH TWICE DAILY BEFORE A MEAL What changed: See the new instructions.   rosuvastatin 5 MG tablet Commonly known as: CRESTOR Take 5 mg by mouth at bedtime.   valsartan-hydrochlorothiazide 320-12.5 MG tablet Commonly known as: DIOVAN-HCT Take 1 tablet by mouth daily.        Allergies:  Allergies  Allergen Reactions   Gabapentin Other  (See Comments)    syncope   Capoten [Captopril] Cough   Celexa [Citalopram Hydrobromide]     Passed out   Hydrocodone     "awake all night", felt terrible   Lexapro [Escitalopram Oxalate]     "see psychodelic lights"   Lipitor [Atorvastatin Calcium] Other (See Comments)    Achy legs   Tramadol     confusion    Family History: Family History  Problem Relation Age of Onset   Hyperlipidemia Mother    Heart attack Mother    Parkinson's disease Father    Stroke Father 56   Hyperlipidemia Sister    Alcoholism Brother    Diabetes Brother    Heart failure Paternal Grandmother    Colon cancer Neg Hx    Gastric cancer Neg Hx    Esophageal cancer Neg Hx    Liver disease Neg Hx    Pancreatic disease Neg Hx     Social History:  reports that she quit smoking about 36 years ago. Her smoking use included cigarettes. She has a 3.00 pack-year smoking history. She has never used smokeless tobacco. She reports that she does not currently use alcohol. She reports that she does not use drugs.  ROS: All other review of systems were reviewed and are negative except what is noted above in HPI  Physical Exam: BP 132/77    Pulse (!) 57   Constitutional:  Alert and oriented, No acute distress. HEENT: [place] AT, moist mucus membranes.  Trachea midline, no masses. Cardiovascular: No clubbing, cyanosis, or edema. Respiratory: Normal respiratory effort, no increased work of breathing. GI: Abdomen is soft, nontender, nondistended, no abdominal masses GU: No CVA tenderness.  Lymph: No cervical or inguinal lymphadenopathy. Skin: No rashes, bruises or suspicious lesions. Neurologic: Grossly intact, no focal deficits, moving all 4 extremities. Psychiatric: Normal mood and affect.  Laboratory Data: Lab Results  Component Value Date   WBC 11.1 (H) 12/10/2021   HGB 10.5 (L) 12/10/2021   HCT 31.0 (L) 12/10/2021   MCV 85.4 12/10/2021   PLT 134 (L) 12/10/2021    Lab Results  Component Value Date    CREATININE 1.31 (H) 12/09/2021    No results found for: PSA  No results found for: TESTOSTERONE  Lab Results  Component Value Date   HGBA1C 7.1 (H) 11/26/2021    Urinalysis    Component Value Date/Time   APPEARANCEUR Clear 10/27/2021 1524   GLUCOSEU Negative 10/27/2021 1524   BILIRUBINUR Negative 10/27/2021 1524   PROTEINUR Negative 10/27/2021 1524   NITRITE Negative 10/27/2021 1524   LEUKOCYTESUR Trace (A) 10/27/2021 1524    Lab Results  Component Value Date   LABMICR Comment 10/27/2021    Pertinent Imaging:  No results found for this or any previous visit.  No results found for this or  any previous visit.  No results found for this or any previous visit.  No results found for this or any previous visit.  No results found for this or any previous visit.  No results found for this or any previous visit.  No results found for this or any previous visit.  No results found for this or any previous visit.   Assessment & Plan:    1. 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  Nicolette Bang, MD  [facility]

## 2021-12-23 LAB — CBC WITH DIFFERENTIAL
Basophils Absolute: 0.1 10*3/uL (ref 0.0–0.2)
Basos: 1 %
EOS (ABSOLUTE): 0.5 10*3/uL — ABNORMAL HIGH (ref 0.0–0.4)
Eos: 4 %
Hematocrit: 33.7 % — ABNORMAL LOW (ref 34.0–46.6)
Hemoglobin: 11.2 g/dL (ref 11.1–15.9)
Immature Grans (Abs): 0 10*3/uL (ref 0.0–0.1)
Immature Granulocytes: 0 %
Lymphocytes Absolute: 2.8 10*3/uL (ref 0.7–3.1)
Lymphs: 25 %
MCH: 28.4 pg (ref 26.6–33.0)
MCHC: 33.2 g/dL (ref 31.5–35.7)
MCV: 85 fL (ref 79–97)
Monocytes Absolute: 0.5 10*3/uL (ref 0.1–0.9)
Monocytes: 5 %
Neutrophils Absolute: 7.5 10*3/uL — ABNORMAL HIGH (ref 1.4–7.0)
Neutrophils: 65 %
RBC: 3.95 x10E6/uL (ref 3.77–5.28)
RDW: 14.2 % (ref 11.7–15.4)
WBC: 11.4 10*3/uL — ABNORMAL HIGH (ref 3.4–10.8)

## 2021-12-23 LAB — BASIC METABOLIC PANEL
BUN/Creatinine Ratio: 15 (ref 12–28)
BUN: 19 mg/dL (ref 8–27)
CO2: 26 mmol/L (ref 20–29)
Calcium: 9.3 mg/dL (ref 8.7–10.3)
Chloride: 99 mmol/L (ref 96–106)
Creatinine, Ser: 1.31 mg/dL — ABNORMAL HIGH (ref 0.57–1.00)
Glucose: 176 mg/dL — ABNORMAL HIGH (ref 70–99)
Potassium: 4.1 mmol/L (ref 3.5–5.2)
Sodium: 139 mmol/L (ref 134–144)
eGFR: 43 mL/min/{1.73_m2} — ABNORMAL LOW (ref >59)

## 2021-12-23 NOTE — Discharge Summary (Signed)
Physician Discharge Summary  Patient ID: Debra Vasquez MRN: [id number] DOB/AGE: 07/03/1946 75 y.o.  Admit date: 12/07/2021 Discharge date: 12/10/2021  Admission Diagnoses:  Renal mass  Discharge Diagnoses:  Principal Problem:   Renal mass, left   Past Medical History:  Diagnosis Date   Arthritis    CHF (congestive heart failure) ([place])    Diabetes mellitus without complication ([place])    Dysphagia    Family history of adverse reaction to anesthesia    Mother had problems waking up   GERD (gastroesophageal reflux disease)    Gout    Hypertension    Renal mass, left     Surgeries: Procedure(s): XI ROBOTIC ASSITED PARTIAL NEPHRECTOMY on 12/07/2021   Consultants (if any):   Discharged Condition: Improved  Hospital Course: Debra Vasquez is an 75 y.o. female who was admitted 12/07/2021 with a diagnosis of Renal mass and went to the operating room on 12/07/2021 and underwent the above named procedures.    She was given perioperative antibiotics:  Anti-infectives (From admission, onward)    Start     Dose/Rate Route Frequency Ordered Stop   12/07/21 1013  ceFAZolin (ANCEF) IVPB 2g/100 mL premix        2 g 200 mL/hr over 30 Minutes Intravenous 30 min pre-op 12/07/21 1013 12/07/21 1231     .  She was given sequential compression devices, early ambulation for DVT prophylaxis.  She benefited maximally from the hospital stay and there were no complications.    Recent vital signs:  Vitals:   12/09/21 1306 12/10/21 0514  BP: 112/64 133/61  Pulse: (!) 54 63  Resp: 14 17  Temp: 97.7 F (36.5 C) 97.9 F (36.6 C)  SpO2: 98% 96%    Recent laboratory studies:  Lab Results  Component Value Date   HGB 10.5 (L) 12/10/2021   HGB 9.6 (L) 12/09/2021   HGB 10.7 (L) 12/08/2021   Lab Results  Component Value Date   WBC 11.1 (H) 12/10/2021   PLT 134 (L) 12/10/2021   Lab Results  Component Value Date   INR 1.1 11/26/2021   Lab Results  Component Value Date    NA 127 (L) 12/09/2021   K 4.1 12/09/2021   CL 97 (L) 12/09/2021   CO2 23 12/09/2021   BUN 21 12/09/2021   CREATININE 1.31 (H) 12/09/2021   GLUCOSE 137 (H) 12/09/2021    Discharge Medications:   Allergies as of 12/10/2021       Reactions   Gabapentin Other (See Comments)   syncope   Capoten [captopril] Cough   Celexa [citalopram Hydrobromide]    Passed out   Hydrocodone    "awake all night", felt terrible   Lexapro [escitalopram Oxalate]    "see psychodelic lights"   Lipitor [atorvastatin Calcium] Other (See Comments)   Achy legs   Tramadol    confusion        Medication List     STOP taking these medications    aspirin EC 81 MG tablet   CALCIUM-MAGNESIUM-VITAMIN D PO   ibuprofen 200 MG tablet Commonly known as: ADVIL       TAKE these medications    allopurinol 300 MG tablet Commonly known as: ZYLOPRIM Take 300 mg by mouth in the morning.   cloNIDine 0.3 MG tablet Commonly known as: CATAPRES Take 0.3 mg by mouth 2 (two) times daily.   docusate sodium 100 MG capsule Commonly known as: COLACE Take 1 capsule (100 mg total) by mouth 2 (two) times  daily.   furosemide 20 MG tablet Commonly known as: LASIX Take 20 mg by mouth daily as needed for edema.   glipiZIDE 5 MG 24 hr tablet Commonly known as: GLUCOTROL XL Take 5 mg by mouth in the morning.   Magnesium 400 MG Tabs Take 800 mg by mouth daily.   metFORMIN 500 MG 24 hr tablet Commonly known as: GLUCOPHAGE-XR Take 500 mg by mouth at bedtime.   metoprolol tartrate 50 MG tablet Commonly known as: LOPRESSOR Take 50 mg by mouth 2 (two) times daily.   oxyCODONE 5 MG immediate release tablet Commonly known as: Roxicodone Take 1-2 tablets (5-10 mg total) by mouth every 6 (six) hours as needed for moderate pain.   pantoprazole 40 MG tablet Commonly known as: PROTONIX TAKE 1 TABLET(40 MG) BY MOUTH TWICE DAILY BEFORE A MEAL What changed: See the new instructions.   rosuvastatin 5 MG  tablet Commonly known as: CRESTOR Take 5 mg by mouth at bedtime.   valsartan-hydrochlorothiazide 320-12.5 MG tablet Commonly known as: DIOVAN-HCT Take 1 tablet by mouth daily.        Diagnostic Studies: No results found.  Disposition: Discharge disposition: 01-Home or Self Care          Follow-up Information     Cleon Gustin, MD Follow up on 12/17/2021.   Specialty: Urology Why: at 11:15 Contact information: [place] [id number] [phone]                  Signed: Nicolette Bang 12/23/2021, 8:55 AM

## 2021-12-24 DIAGNOSIS — E1122 Type 2 diabetes mellitus with diabetic chronic kidney disease: Secondary | ICD-10-CM | POA: Diagnosis not present

## 2021-12-24 DIAGNOSIS — E7849 Other hyperlipidemia: Secondary | ICD-10-CM | POA: Diagnosis not present

## 2021-12-24 DIAGNOSIS — M1000 Idiopathic gout, unspecified site: Secondary | ICD-10-CM | POA: Diagnosis not present

## 2021-12-24 DIAGNOSIS — I129 Hypertensive chronic kidney disease with stage 1 through stage 4 chronic kidney disease, or unspecified chronic kidney disease: Secondary | ICD-10-CM | POA: Diagnosis not present

## 2021-12-24 DIAGNOSIS — N183 Chronic kidney disease, stage 3 unspecified: Secondary | ICD-10-CM | POA: Diagnosis not present

## 2021-12-28 ENCOUNTER — Telehealth: Payer: Self-pay

## 2021-12-28 NOTE — Telephone Encounter (Signed)
Pt was called and spoke to, she was understanding that her labs were normal and to keep next scheduled appointment

## 2021-12-28 NOTE — Telephone Encounter (Signed)
-----   Message from Cleon Gustin, MD sent at 12/28/2021  9:25 AM EST ----- normal ----- Message ----- From: Clearence Cheek, CMA Sent: 12/28/2021   8:09 AM EST To: Cleon Gustin, MD  Please review

## 2021-12-29 DIAGNOSIS — E7849 Other hyperlipidemia: Secondary | ICD-10-CM | POA: Diagnosis not present

## 2021-12-29 DIAGNOSIS — I10 Essential (primary) hypertension: Secondary | ICD-10-CM | POA: Diagnosis not present

## 2021-12-29 DIAGNOSIS — E1122 Type 2 diabetes mellitus with diabetic chronic kidney disease: Secondary | ICD-10-CM | POA: Diagnosis not present

## 2021-12-29 DIAGNOSIS — K219 Gastro-esophageal reflux disease without esophagitis: Secondary | ICD-10-CM | POA: Diagnosis not present

## 2021-12-29 DIAGNOSIS — Z1329 Encounter for screening for other suspected endocrine disorder: Secondary | ICD-10-CM | POA: Diagnosis not present

## 2021-12-29 DIAGNOSIS — G619 Inflammatory polyneuropathy, unspecified: Secondary | ICD-10-CM | POA: Diagnosis not present

## 2021-12-29 DIAGNOSIS — N183 Chronic kidney disease, stage 3 unspecified: Secondary | ICD-10-CM | POA: Diagnosis not present

## 2021-12-29 DIAGNOSIS — E1169 Type 2 diabetes mellitus with other specified complication: Secondary | ICD-10-CM | POA: Diagnosis not present

## 2021-12-29 DIAGNOSIS — E782 Mixed hyperlipidemia: Secondary | ICD-10-CM | POA: Diagnosis not present

## 2022-01-02 ENCOUNTER — Emergency Department (HOSPITAL_COMMUNITY): Payer: Medicare Other

## 2022-01-02 ENCOUNTER — Other Ambulatory Visit: Payer: Self-pay

## 2022-01-02 ENCOUNTER — Encounter (HOSPITAL_COMMUNITY): Payer: Self-pay | Admitting: *Deleted

## 2022-01-02 ENCOUNTER — Emergency Department (HOSPITAL_COMMUNITY)
Admission: EM | Admit: 2022-01-02 | Discharge: 2022-01-02 | Disposition: A | Discharge status: Home / Self Care | Payer: Medicare Other | Attending: Emergency Medicine | Admitting: Emergency Medicine

## 2022-01-02 DIAGNOSIS — D72829 Elevated white blood cell count, unspecified: Secondary | ICD-10-CM | POA: Insufficient documentation

## 2022-01-02 DIAGNOSIS — Z79899 Other long term (current) drug therapy: Secondary | ICD-10-CM | POA: Insufficient documentation

## 2022-01-02 DIAGNOSIS — R222 Localized swelling, mass and lump, trunk: Secondary | ICD-10-CM | POA: Diagnosis not present

## 2022-01-02 DIAGNOSIS — Z7984 Long term (current) use of oral hypoglycemic drugs: Secondary | ICD-10-CM | POA: Insufficient documentation

## 2022-01-02 DIAGNOSIS — I10 Essential (primary) hypertension: Secondary | ICD-10-CM | POA: Diagnosis not present

## 2022-01-02 DIAGNOSIS — Z85528 Personal history of other malignant neoplasm of kidney: Secondary | ICD-10-CM | POA: Diagnosis not present

## 2022-01-02 DIAGNOSIS — E119 Type 2 diabetes mellitus without complications: Secondary | ICD-10-CM | POA: Diagnosis not present

## 2022-01-02 DIAGNOSIS — I700 Atherosclerosis of aorta: Secondary | ICD-10-CM | POA: Diagnosis not present

## 2022-01-02 DIAGNOSIS — R1084 Generalized abdominal pain: Secondary | ICD-10-CM | POA: Insufficient documentation

## 2022-01-02 DIAGNOSIS — R109 Unspecified abdominal pain: Secondary | ICD-10-CM | POA: Diagnosis not present

## 2022-01-02 DIAGNOSIS — R079 Chest pain, unspecified: Secondary | ICD-10-CM | POA: Diagnosis not present

## 2022-01-02 LAB — CBC WITH DIFFERENTIAL/PLATELET
Abs Immature Granulocytes: 0.05 10*3/uL (ref 0.00–0.07)
Basophils Absolute: 0.1 10*3/uL (ref 0.0–0.1)
Basophils Relative: 1 %
Eosinophils Absolute: 0.3 10*3/uL (ref 0.0–0.5)
Eosinophils Relative: 3 %
HCT: 36.9 % (ref 36.0–46.0)
Hemoglobin: 12 g/dL (ref 12.0–15.0)
Immature Granulocytes: 0 %
Lymphocytes Relative: 29 %
Lymphs Abs: 3.3 10*3/uL (ref 0.7–4.0)
MCH: 28.6 pg (ref 26.0–34.0)
MCHC: 32.5 g/dL (ref 30.0–36.0)
MCV: 87.9 fL (ref 80.0–100.0)
Monocytes Absolute: 0.7 10*3/uL (ref 0.1–1.0)
Monocytes Relative: 7 %
Neutro Abs: 7 10*3/uL (ref 1.7–7.7)
Neutrophils Relative %: 60 %
Platelets: 228 10*3/uL (ref 150–400)
RBC: 4.2 MIL/uL (ref 3.87–5.11)
RDW: 14.5 % (ref 11.5–15.5)
WBC: 11.5 10*3/uL — ABNORMAL HIGH (ref 4.0–10.5)
nRBC: 0 % (ref 0.0–0.2)

## 2022-01-02 LAB — URINALYSIS, ROUTINE W REFLEX MICROSCOPIC
Bacteria, UA: NONE SEEN
Bilirubin Urine: NEGATIVE
Glucose, UA: NEGATIVE mg/dL
Hgb urine dipstick: NEGATIVE
Ketones, ur: NEGATIVE mg/dL
Nitrite: NEGATIVE
Protein, ur: NEGATIVE mg/dL
Specific Gravity, Urine: 1.02 (ref 1.005–1.030)
WBC, UA: >50 WBC/hpf — ABNORMAL HIGH (ref 0–5)
pH: 5 (ref 5.0–8.0)

## 2022-01-02 LAB — BASIC METABOLIC PANEL
Anion gap: 10 (ref 5–15)
BUN: 25 mg/dL — ABNORMAL HIGH (ref 8–23)
CO2: 28 mmol/L (ref 22–32)
Calcium: 8.9 mg/dL (ref 8.9–10.3)
Chloride: 100 mmol/L (ref 98–111)
Creatinine, Ser: 1.32 mg/dL — ABNORMAL HIGH (ref 0.44–1.00)
GFR, Estimated: 43 mL/min — ABNORMAL LOW (ref >60)
Glucose, Bld: 183 mg/dL — ABNORMAL HIGH (ref 70–99)
Potassium: 3.9 mmol/L (ref 3.5–5.1)
Sodium: 138 mmol/L (ref 135–145)

## 2022-01-02 MED ORDER — IOHEXOL 300 MG/ML  SOLN
80.0000 mL | Freq: Once | INTRAMUSCULAR | Status: AC | PRN
  Administered 2022-01-02: 75 mL via INTRAVENOUS

## 2022-01-02 MED ORDER — SODIUM CHLORIDE 0.9 % IV BOLUS
500.0000 mL | Freq: Once | INTRAVENOUS | Status: AC
  Administered 2022-01-02: 500 mL via INTRAVENOUS

## 2022-01-02 NOTE — ED Notes (Signed)
Pt wanted something to drink advised I would ask RN. Asked RN AM stated pt needed to wait until CT resulted advised pt

## 2022-01-02 NOTE — Discharge Instructions (Signed)
Lab work was reassuring, imaging showed that he had some fluid on your left kidney and this is likely postoperative fluid if your symptoms become consistent worsen he should develop a fever like to come back in for reevaluation.  The bumps on your chest are likely your clavicle sternal joint would recommend following up with your PCP for further evaluation.  Come back to the emergency department if you develop chest pain, shortness of breath, severe abdominal pain, uncontrolled nausea, vomiting, diarrhea.

## 2022-01-02 NOTE — ED Notes (Signed)
Patient transported to CT 

## 2022-01-02 NOTE — ED Triage Notes (Signed)
Pt noted a knot come up today to right collar bone.  Pt c/o pain to left side from surgery where part of her kidney was removed, 12/07/22.

## 2022-01-02 NOTE — ED Notes (Signed)
Pt attempting to give urine sample at this time.

## 2022-01-02 NOTE — ED Provider Notes (Signed)
[facility] EMERGENCY DEPARTMENT Provider Note   CSN: [id number] Arrival date & time: 01/02/22  1404     History  No chief complaint on file.   Debra Vasquez is a 76 y.o. female.  HPI  Patient with medical history including hypertension, diabetes, GERD, renal carcinoma status post left partial nephrectomy presents  with chief complaint of nodules on her chest.  He states starting today she noticed that she had lumps on the upper part of her chest at the steroid sternal joints, states they are slightly tender but is no other complaints, she denies any chest pain or shortness of breath, fevers, chills, peripheral edema.  She states that she is feels slightly weak but she has felt weak since her surgery.  She also notes that she is having some slight stomach pain, states the pain is intermittent, mainly feels at nighttime, pain is around her incision sites, there is no drainage or discharge from the area no redness around the surgical sites itself.  No associated nausea, vomiting, diarrhea, states she is having normal urinary output no dysuria or hematuria denies any flank tenderness, she has no complaints at this time.  After reviewing patient's chart recently had left partial nephrectomy which shows T3a with negative margins, surgery performed by Dr. Alyson Ingles, she is being followed by [facility] saw him on 12/28 everything appears to be healing well.  Home Medications Prior to Admission medications   Medication Sig Start Date End Date Taking? Authorizing Provider  allopurinol (ZYLOPRIM) 300 MG tablet Take 300 mg by mouth in the morning.     [provider]  cloNIDine (CATAPRES) 0.3 MG tablet Take 0.3 mg by mouth 2 (two) times daily.  07/06/19   [provider]  docusate sodium (COLACE) 100 MG capsule Take 1 capsule (100 mg total) by mouth 2 (two) times daily. 12/07/21   Debbrah Alar, PA-C  furosemide (LASIX) 20 MG tablet Take 20 mg by mouth daily as needed for  edema.     [provider]  glipiZIDE (GLUCOTROL XL) 5 MG 24 hr tablet Take 5 mg by mouth in the morning.     [provider]  Magnesium 400 MG TABS Take 800 mg by mouth daily.    [provider]  metFORMIN (GLUCOPHAGE-XR) 500 MG 24 hr tablet Take 500 mg by mouth at bedtime.  06/25/19   [provider]  metoprolol tartrate (LOPRESSOR) 50 MG tablet Take 50 mg by mouth 2 (two) times daily.    [provider]  oxyCODONE (ROXICODONE) 5 MG immediate release tablet Take 1-2 tablets (5-10 mg total) by mouth every 6 (six) hours as needed for moderate pain. 12/07/21 12/07/22  Debbrah Alar, PA-C  pantoprazole (PROTONIX) 40 MG tablet TAKE 1 TABLET(40 MG) BY MOUTH TWICE DAILY BEFORE A MEAL Patient taking differently: Take 40 mg by mouth 2 (two) times daily as needed (acid reflux). 05/12/21   Erenest Rasher, PA-C  rosuvastatin (CRESTOR) 5 MG tablet Take 5 mg by mouth at bedtime. 07/08/19   [provider]  valsartan-hydrochlorothiazide (DIOVAN-HCT) 320-12.5 MG tablet Take 1 tablet by mouth daily.    [provider]      Allergies    Gabapentin, Capoten [captopril], Celexa [citalopram hydrobromide], Hydrocodone, Lexapro [escitalopram oxalate], Lipitor [atorvastatin calcium], and Tramadol    Review of Systems   Review of Systems  Constitutional:  Negative for chills and fever.  HENT:  Negative for congestion.   Respiratory:  Negative for cough and shortness of  breath.   Cardiovascular:  Negative for chest pain.  Gastrointestinal:  Positive for abdominal pain and nausea. Negative for vomiting.  Neurological:  Negative for headaches.   Physical Exam Updated Vital Signs BP 120/64    Pulse (!) 53    Temp 97.6 F (36.4 C) (Oral)    Resp 16    Ht 5\' 5"  (1.651 m)    Wt 72.6 kg    SpO2 96%    BMI 26.63 kg/m  Physical Exam Vitals and nursing note reviewed.  Constitutional:      General: She is not in acute distress.    Appearance: She is not  ill-appearing.  HENT:     Head: Normocephalic and atraumatic.     Nose: No congestion.     Mouth/Throat:     Mouth: Mucous membranes are moist.     Pharynx: Oropharynx is clear. No oropharyngeal exudate or posterior oropharyngeal erythema.  Eyes:     Conjunctiva/sclera: Conjunctivae normal.  Cardiovascular:     Rate and Rhythm: Normal rate and regular rhythm.     Pulses: Normal pulses.     Heart sounds: No murmur heard.   No friction rub. No gallop.  Pulmonary:     Effort: No respiratory distress.     Breath sounds: No wheezing, rhonchi or rales.     Comments: Patient's chest is visualized there is no gross hemorrhage present, patient's clavicle sternal joint were slightly elevated but there is no overlying skin changes, area was nontender to palpation, no fluctuant induration noted, no palpable mass or nodules present Abdominal:     Palpations: Abdomen is soft.     Tenderness: There is abdominal tenderness. There is no right CVA tenderness or left CVA tenderness.     Comments: Abdomen is visualized she has notable surgical scars appear to be healing well no signs infection present, abdomen is nondistended dull to percussion, normoactive bowel sounds, she has slight tenderness to palpation around her surgical sites, there is no guarding, rebound touch, peritoneal sign negative Murphy sign McBurney point.  No CVA tenderness.  Musculoskeletal:     Right lower leg: No edema.     Left lower leg: No edema.  Skin:    General: Skin is warm and dry.  Neurological:     Mental Status: She is alert.  Psychiatric:        Mood and Affect: Mood normal.    ED Results / Procedures / Treatments   Labs (all labs ordered are listed, but only abnormal results are displayed) Labs Reviewed  BASIC METABOLIC PANEL - Abnormal; Notable for the following components:      Result Value   Glucose, Bld 183 (*)    BUN 25 (*)    Creatinine, Ser 1.32 (*)    GFR, Estimated 43 (*)    All other components  within normal limits  CBC WITH DIFFERENTIAL/PLATELET - Abnormal; Notable for the following components:   WBC 11.5 (*)    All other components within normal limits  URINALYSIS, ROUTINE W REFLEX MICROSCOPIC - Abnormal; Notable for the following components:   APPearance HAZY (*)    Leukocytes,Ua LARGE (*)    WBC, UA >50 (*)    Non Squamous Epithelial 0-5 (*)    All other components within normal limits    EKG None  Radiology CT ABDOMEN PELVIS W CONTRAST  Result Date: 01/02/2022 CLINICAL DATA:  Surgery to remove part her left kidney on 12/07/2021. Pain along the left abdomen in the region of  the surgery. EXAM: CT ABDOMEN AND PELVIS WITH CONTRAST TECHNIQUE: Multidetector CT imaging of the abdomen and pelvis was performed using the standard protocol following bolus administration of intravenous contrast. CONTRAST:  34mL OMNIPAQUE IOHEXOL 300 MG/ML  SOLN COMPARISON:  Abdomen MRI, 10/07/2021. FINDINGS: Lower chest: Dependent opacity at the right lung base consistent with atelectasis. No convincing acute findings. Hepatobiliary: Liver normal in size and overall attenuation. 2.1 cm low attenuation mass at the dome of the right lobe, consistent with a cyst, stable from the prior MRI. Liver also shows central volume loss, relative enlargement of the lateral segment of the left lobe and surface nodularity findings suspicious for cirrhosis. Gallbladder surgically absent. No bile duct dilation. Pancreas: Unremarkable. No pancreatic ductal dilatation or surrounding inflammatory changes. Spleen: Normal in size without focal abnormality. Adrenals/Urinary Tract: No adrenal masses. Low attenuation lies along the superior margin of the left kidney, within the partial nephrectomy cavity, consistent with postoperative fluid, measuring approximately 4.5 x 1.9 x 4.5 cm. There is a tiny bubble of air along the superior margin of this with mild adjacent inflammatory changes. 1.1 cm cyst arises from the medial upper pole left  kidney with a 5.3 cm exophytic cyst from the posterior lower pole. No other left renal masses. No right renal masses. Both kidneys show normal enhancement and excretion. No renal stones. No hydronephrosis. Normal ureters. Normal bladder. Stomach/Bowel: Normal stomach. Small bowel and colon are normal in caliber. No wall thickening. No inflammation. Multiple sigmoid colon diverticula without diverticulitis. Normal appendix visualized. Vascular/Lymphatic: Aortic atherosclerosis. No aneurysm. No enlarged lymph nodes. Reproductive: Status post hysterectomy. No adnexal masses. Other: No ascites or free air. Musculoskeletal: No fracture or acute finding.  No bone lesion. IMPRESSION: 1. Status post partial left nephrectomy with resection of an upper pole mass. There is fluid attenuation with mild adjacent inflammatory type stranding within the resection bed abutting the remaining upper pole of the left kidney. This contains a single tiny bubble of air. This may reflect sterile postoperative fluid. An early abscess is possible. 2. No other evidence of an acute abnormality within the abdomen or pelvis. 3. Liver morphology suspicious for cirrhosis. No suspicious liver mass. 4. Aortic atherosclerosis. Electronically Signed   By: Lajean Manes M.D.   On: 01/02/2022 16:53   DG Chest Portable 1 View  Result Date: 01/02/2022 CLINICAL DATA:  Chest pain. Knot at the right clavicle. EXAM: PORTABLE CHEST 1 VIEW COMPARISON:  Chest x-ray dated August 12, 2021. FINDINGS: The heart size and mediastinal contours are within normal limits. Normal pulmonary vascularity. Unchanged right hemidiaphragm elevation with right basilar scarring. No focal consolidation, pleural effusion, or pneumothorax. No acute osseous abnormality. IMPRESSION: No active disease. Electronically Signed   By: Titus Dubin M.D.   On: 01/02/2022 15:11    Procedures Procedures    Medications Ordered in ED Medications  sodium chloride 0.9 % bolus 500 mL (500  mLs Intravenous New Bag/Given 01/02/22 1645)  iohexol (OMNIPAQUE) 300 MG/ML solution 80 mL (75 mLs Intravenous Contrast Given 01/02/22 1628)    ED Course/ Medical Decision Making/ A&P                           Medical Decision Making  This patient presents to the ED for concern of chest mass stomach pain, this involves an extensive number of treatment options, and is a complaint that carries with it a high risk of complications and morbidity.  The differential diagnosis includes malignancy, intra-abdominal  infection    Additional history obtained:  Additional history obtained from electronic medical record External records from outside source obtained and reviewed including previous urology note   Co morbidities that complicate the patient evaluation  Renal carcinoma  Social Determinants of Health:  N/A    Lab Tests:  I Ordered, and personally interpreted labs.  The pertinent results include: CBC shows leukocytosis of 11.5 BMP shows glucose of 183 BUN of 25 creatinine 1.32 GFR 43   Imaging Studies ordered:  I ordered imaging studies including chest x-ray, CT abdomen pelvis I independently visualized and interpreted imaging which showed unremarkable, CT abdomen pelvis reveals tiny bubble of air could reflect postoperative fluid or possible early abscess I agree with the radiologist interpretation   Cardiac Monitoring:  The patient was maintained on a cardiac monitor.  I personally viewed and interpreted the cardiac monitored which showed an underlying rhythm of: N/A   Reevaluation: Patient is reassessed updated on lab work as well as imaging, she is resting comfortably, and states that she still having some stomach pain a reassess her abdomen she still slightly tender around her surgical sites she does have a noted leukocytosis with abdominal pain,  I am concerned for possible intra-abdominal infection will obtain CT abdomen pelvis for further evaluation, her GFR appears to  be at her baseline  will provide her with some fluid resuscitation to help mitigate any distress to the kidneys during this exam.  CT scan shows evidence of a fluid collection could be postoperative fluid versus early abscess we will consult with urology for further recommendations.  Consultations Obtained:  I requested consultation with the spoke with Dr. Louis Meckel,  and discussed lab and imaging findings as well as pertinent plan - they recommend: He also feels that this is most likely perioperative fluid and patient be followed up as an outpatient.    Rule out I have low suspicion for systemic infection as patient is nontoxic-appearing, vital signs are reassuring.  She does have slight leukocytosis but suspect this is more acute phase reactants as it is only mildly elevated,  she is afebrile nontachycardic.  I have low suspicion for intra-abdominal abscess as patient patient is tender around her surgical site and the fluid is noted around the left partial nephrectomy would expect pain on her left side, she is also afebrile nontachycardic only mild leukocytosis and her surgery  has been about a month out would expect more evidence of infection at this time.  I have low suspicion for UTI, [place], kidney stone that she has no urinary symptoms no CVA tenderness, UA is abnormal but she has multiple squamous cells which I suspect contaminated the specimen will defer culturing at this time since she is not having any urinary symptoms and urine is contaminated.  I have low suspicion for fracture or dislocation of the clavicle sternal joint there is no joint separation, imaging is negative for acute findings.    Dispostion and problem list  After consideration of the diagnostic results and the patients response to treatment, I feel that the patent would benefit from   Mass on chest-I suspect patient is palpating her clavicle mastoid joints, possibly could be some inflammation or enlarged lymph node but I do  not palpate any lymph nodes on my exam.  We will have him monitor this follow-up with PCP for further evaluation. Stomach pain-likely postop pain, I explained that if she has consistent pain and start develop fever like to come back in for reevaluation is  possible this could be an abscess but I feel this is less likely at this time..             Final Clinical Impression(s) / ED Diagnoses Final diagnoses:  Chest mass  Generalized abdominal pain    Rx / DC Orders ED Discharge Orders     None         Marcello Fennel, PA-C 01/02/22 [place], Ankit, MD 01/04/22 1252

## 2022-01-03 ENCOUNTER — Telehealth: Payer: Self-pay

## 2022-01-03 NOTE — Telephone Encounter (Signed)
Received call from daughter in law, patient went to ER for "swelling in chest/neck"   Reviewed ER visit with Dr. Alyson Ingles who reviewed patient CT results.  Dr. Alyson Ingles recommends patient see her PCP as he feels her CT looks appropriate from urology standpoint.   Daughter in law voiced understanding and patient has appt with PCP tomorrow.

## 2022-01-04 DIAGNOSIS — E782 Mixed hyperlipidemia: Secondary | ICD-10-CM | POA: Diagnosis not present

## 2022-01-04 DIAGNOSIS — M8580 Other specified disorders of bone density and structure, unspecified site: Secondary | ICD-10-CM | POA: Diagnosis not present

## 2022-01-04 DIAGNOSIS — E1122 Type 2 diabetes mellitus with diabetic chronic kidney disease: Secondary | ICD-10-CM | POA: Diagnosis not present

## 2022-01-04 DIAGNOSIS — E7849 Other hyperlipidemia: Secondary | ICD-10-CM | POA: Diagnosis not present

## 2022-01-04 DIAGNOSIS — E739 Lactose intolerance, unspecified: Secondary | ICD-10-CM | POA: Diagnosis not present

## 2022-01-04 DIAGNOSIS — Z1212 Encounter for screening for malignant neoplasm of rectum: Secondary | ICD-10-CM | POA: Diagnosis not present

## 2022-01-04 DIAGNOSIS — Z6826 Body mass index (BMI) 26.0-26.9, adult: Secondary | ICD-10-CM | POA: Diagnosis not present

## 2022-01-04 DIAGNOSIS — M1000 Idiopathic gout, unspecified site: Secondary | ICD-10-CM | POA: Diagnosis not present

## 2022-01-04 DIAGNOSIS — K7581 Nonalcoholic steatohepatitis (NASH): Secondary | ICD-10-CM | POA: Diagnosis not present

## 2022-01-04 DIAGNOSIS — Z9189 Other specified personal risk factors, not elsewhere classified: Secondary | ICD-10-CM | POA: Diagnosis not present

## 2022-01-04 DIAGNOSIS — R300 Dysuria: Secondary | ICD-10-CM | POA: Diagnosis not present

## 2022-01-04 DIAGNOSIS — N1832 Chronic kidney disease, stage 3b: Secondary | ICD-10-CM | POA: Diagnosis not present

## 2022-01-04 DIAGNOSIS — I10 Essential (primary) hypertension: Secondary | ICD-10-CM | POA: Diagnosis not present

## 2022-01-04 DIAGNOSIS — G43909 Migraine, unspecified, not intractable, without status migrainosus: Secondary | ICD-10-CM | POA: Diagnosis not present

## 2022-02-04 DIAGNOSIS — B338 Other specified viral diseases: Secondary | ICD-10-CM | POA: Diagnosis not present

## 2022-02-04 DIAGNOSIS — L723 Sebaceous cyst: Secondary | ICD-10-CM | POA: Diagnosis not present

## 2022-02-22 DIAGNOSIS — Z20822 Contact with and (suspected) exposure to covid-19: Secondary | ICD-10-CM | POA: Diagnosis not present

## 2022-02-24 DIAGNOSIS — R221 Localized swelling, mass and lump, neck: Secondary | ICD-10-CM | POA: Diagnosis not present

## 2022-02-28 DIAGNOSIS — Z20828 Contact with and (suspected) exposure to other viral communicable diseases: Secondary | ICD-10-CM | POA: Diagnosis not present

## 2022-03-03 DIAGNOSIS — M898X1 Other specified disorders of bone, shoulder: Secondary | ICD-10-CM | POA: Diagnosis not present

## 2022-03-10 ENCOUNTER — Other Ambulatory Visit (HOSPITAL_COMMUNITY): Payer: Self-pay | Admitting: Orthopedic Surgery

## 2022-03-10 ENCOUNTER — Other Ambulatory Visit: Payer: Self-pay | Admitting: Orthopedic Surgery

## 2022-03-15 ENCOUNTER — Other Ambulatory Visit: Payer: Self-pay

## 2022-03-15 ENCOUNTER — Other Ambulatory Visit: Payer: Medicare Other

## 2022-03-15 DIAGNOSIS — C642 Malignant neoplasm of left kidney, except renal pelvis: Secondary | ICD-10-CM | POA: Diagnosis not present

## 2022-03-16 DIAGNOSIS — Z20822 Contact with and (suspected) exposure to covid-19: Secondary | ICD-10-CM | POA: Diagnosis not present

## 2022-03-16 LAB — COMPREHENSIVE METABOLIC PANEL
ALT: 20 IU/L (ref 0–32)
AST: 37 IU/L (ref 0–40)
Albumin/Globulin Ratio: 1.6 (ref 1.2–2.2)
Albumin: 3.9 g/dL (ref 3.7–4.7)
Alkaline Phosphatase: 103 IU/L (ref 44–121)
BUN/Creatinine Ratio: 13 (ref 12–28)
BUN: 14 mg/dL (ref 8–27)
Bilirubin Total: 0.5 mg/dL (ref 0.0–1.2)
CO2: 25 mmol/L (ref 20–29)
Calcium: 9.3 mg/dL (ref 8.7–10.3)
Chloride: 101 mmol/L (ref 96–106)
Creatinine, Ser: 1.1 mg/dL — ABNORMAL HIGH (ref 0.57–1.00)
Globulin, Total: 2.4 g/dL (ref 1.5–4.5)
Glucose: 186 mg/dL — ABNORMAL HIGH (ref 70–99)
Potassium: 4 mmol/L (ref 3.5–5.2)
Sodium: 142 mmol/L (ref 134–144)
Total Protein: 6.3 g/dL (ref 6.0–8.5)
eGFR: 53 mL/min/{1.73_m2} — ABNORMAL LOW (ref >59)

## 2022-03-18 ENCOUNTER — Other Ambulatory Visit: Payer: Self-pay

## 2022-03-18 ENCOUNTER — Ambulatory Visit (HOSPITAL_COMMUNITY)
Admission: RE | Admit: 2022-03-18 | Discharge: 2022-03-18 | Disposition: A | Discharge status: Home / Self Care | Payer: Medicare Other | Source: Ambulatory Visit | Attending: Urology | Admitting: Urology

## 2022-03-18 DIAGNOSIS — C642 Malignant neoplasm of left kidney, except renal pelvis: Secondary | ICD-10-CM | POA: Insufficient documentation

## 2022-03-18 DIAGNOSIS — K7689 Other specified diseases of liver: Secondary | ICD-10-CM | POA: Diagnosis not present

## 2022-03-18 DIAGNOSIS — K862 Cyst of pancreas: Secondary | ICD-10-CM | POA: Diagnosis not present

## 2022-03-18 DIAGNOSIS — K7460 Unspecified cirrhosis of liver: Secondary | ICD-10-CM | POA: Diagnosis not present

## 2022-03-18 MED ORDER — IOHEXOL 300 MG/ML  SOLN
80.0000 mL | Freq: Once | INTRAMUSCULAR | Status: AC | PRN
  Administered 2022-03-18: 80 mL via INTRAVENOUS

## 2022-03-22 ENCOUNTER — Other Ambulatory Visit: Payer: Self-pay

## 2022-03-22 ENCOUNTER — Ambulatory Visit (INDEPENDENT_AMBULATORY_CARE_PROVIDER_SITE_OTHER): Payer: Medicare Other | Admitting: Urology

## 2022-03-22 VITALS — BP 167/80 | HR 57

## 2022-03-22 DIAGNOSIS — N2889 Other specified disorders of kidney and ureter: Secondary | ICD-10-CM

## 2022-03-22 NOTE — Progress Notes (Signed)
? ?03/22/2022 ?11:18 AM  ? ?[place] ?1946-06-03 ?[id number] ? ?Referring provider: Caryl Bis, MD ?[address] ?[place],  [place] [address] ? ?Followup left RCC ? ?HPI: ?Debra Vasquez is a 76yo here for followup for left RCC. She denies any flank pain and has been doing well since surgery in 11/2021. CT abd 03/18/2022 shows no evidence of tumor recurrence and no evidence of metastatic disease. She denies any LUTS. No hematuria or dysuria. No other complaints today ? ? ?PMH: ?Past Medical History:  ?Diagnosis Date  ? Arthritis   ? CHF (congestive heart failure) ([place])   ? Diabetes mellitus without complication ([place])   ? Dysphagia   ? Family history of adverse reaction to anesthesia   ? Mother had problems waking up  ? GERD (gastroesophageal reflux disease)   ? Gout   ? Hypertension   ? Renal mass, left   ? ? ?Surgical History: ?Past Surgical History:  ?Procedure Laterality Date  ? ABDOMINAL HYSTERECTOMY    ? CARPAL TUNNEL RELEASE Left   ? CATARACT EXTRACTION W/PHACO Right 09/13/2019  ? Procedure: CATARACT EXTRACTION PHACO AND INTRAOCULAR LENS PLACEMENT RIGHT EYE;  Surgeon: Baruch Goldmann, MD;  Location: [facility];  Service: Ophthalmology;  Laterality: Right;  right  ? CATARACT EXTRACTION W/PHACO Left 09/27/2019  ? Procedure: CATARACT EXTRACTION PHACO AND INTRAOCULAR LENS PLACEMENT LEFT EYE  (CDE: 8.04);  Surgeon: Baruch Goldmann, MD;  Location: [facility];  Service: Ophthalmology;  Laterality: Left;  ? ESOPHAGOGASTRODUODENOSCOPY N/A 11/18/2020  ? Procedure: ESOPHAGOGASTRODUODENOSCOPY (EGD);  Surgeon: Daneil Dolin, MD;  Location: [facility];  Service: Endoscopy;  Laterality: N/A;  7:30am  ? MALONEY DILATION N/A 11/18/2020  ? Procedure: MALONEY DILATION;  Surgeon: Daneil Dolin, MD;  Location: [facility];  Service: Endoscopy;  Laterality: N/A;  ? ROBOTIC ASSITED PARTIAL NEPHRECTOMY Left 12/07/2021  ? Procedure: XI ROBOTIC ASSITED PARTIAL NEPHRECTOMY;  Surgeon: Cleon Gustin, MD;  Location: [facility];   Service: Urology;  Laterality: Left;  ? TUBAL LIGATION    ? WISDOM TOOTH EXTRACTION    ? ? ?Home Medications:  ?Allergies as of 03/22/2022   ? ?   Reactions  ? Gabapentin Other (See Comments)  ? syncope  ? Capoten [captopril] Cough  ? Celexa [citalopram Hydrobromide]   ? Passed out  ? Hydrocodone   ? "awake all night", felt terrible  ? Lexapro [escitalopram Oxalate]   ? "see psychodelic lights"  ? Lipitor [atorvastatin Calcium] Other (See Comments)  ? Achy legs  ? Tramadol   ? confusion  ? ?  ? ?  ?Medication List  ?  ? ?  ? Accurate as of March 22, 2022 11:18 AM. If you have any questions, ask your nurse or doctor.  ?  ?  ? ?  ? ?allopurinol 300 MG tablet ?Commonly known as: ZYLOPRIM ?Take 300 mg by mouth in the morning. ?  ?cloNIDine 0.3 MG tablet ?Commonly known as: CATAPRES ?Take 0.3 mg by mouth 2 (two) times daily. ?  ?docusate sodium 100 MG capsule ?Commonly known as: COLACE ?Take 1 capsule (100 mg total) by mouth 2 (two) times daily. ?  ?furosemide 20 MG tablet ?Commonly known as: LASIX ?Take 20 mg by mouth daily as needed for edema. ?  ?glipiZIDE 5 MG 24 hr tablet ?Commonly known as: GLUCOTROL XL ?Take 5 mg by mouth in the morning. ?  ?Magnesium 400 MG Tabs ?Take 800 mg by mouth daily. ?  ?metFORMIN 500 MG 24 hr tablet ?  Commonly known as: GLUCOPHAGE-XR ?Take 500 mg by mouth at bedtime. ?  ?metoprolol tartrate 50 MG tablet ?Commonly known as: LOPRESSOR ?Take 50 mg by mouth 2 (two) times daily. ?  ?oxyCODONE 5 MG immediate release tablet ?Commonly known as: Roxicodone ?Take 1-2 tablets (5-10 mg total) by mouth every 6 (six) hours as needed for moderate pain. ?  ?pantoprazole 40 MG tablet ?Commonly known as: PROTONIX ?TAKE 1 TABLET(40 MG) BY MOUTH TWICE DAILY BEFORE A MEAL ?  ?rosuvastatin 5 MG tablet ?Commonly known as: CRESTOR ?Take 5 mg by mouth at bedtime. ?  ?valsartan-hydrochlorothiazide 320-12.5 MG tablet ?Commonly known as: DIOVAN-HCT ?Take 1 tablet by mouth daily. ?  ? ?  ? ? ?Allergies:  ?Allergies   ?Allergen Reactions  ? Gabapentin Other (See Comments)  ?  syncope  ? Capoten [Captopril] Cough  ? Celexa [Citalopram Hydrobromide]   ?  Passed out  ? Hydrocodone   ?  "awake all night", felt terrible  ? Lexapro [Escitalopram Oxalate]   ?  "see psychodelic lights"  ? Lipitor [Atorvastatin Calcium] Other (See Comments)  ?  Achy legs  ? Tramadol   ?  confusion  ? ? ?Family History: ?Family History  ?Problem Relation Age of Onset  ? Hyperlipidemia Mother   ? Heart attack Mother   ? Parkinson's disease Father   ? Stroke Father 25  ? Hyperlipidemia Sister   ? Alcoholism Brother   ? Diabetes Brother   ? Heart failure Paternal Grandmother   ? Colon cancer Neg Hx   ? Gastric cancer Neg Hx   ? Esophageal cancer Neg Hx   ? Liver disease Neg Hx   ? Pancreatic disease Neg Hx   ? ? ?Social History:  reports that she quit smoking about 36 years ago. Her smoking use included cigarettes. She has a 3.00 pack-year smoking history. She has never used smokeless tobacco. She reports that she does not currently use alcohol. She reports that she does not use drugs. ? ?ROS: ?All other review of systems were reviewed and are negative except what is noted above in HPI ? ?Physical Exam: ?BP (!) 167/80   Pulse (!) 57   ?Constitutional:  Alert and oriented, No acute distress. ?HEENT: [place] AT, moist mucus membranes.  Trachea midline, no masses. ?Cardiovascular: No clubbing, cyanosis, or edema. ?Respiratory: Normal respiratory effort, no increased work of breathing. ?GI: Abdomen is soft, nontender, nondistended, no abdominal masses ?GU: No CVA tenderness.  ?Lymph: No cervical or inguinal lymphadenopathy. ?Skin: No rashes, bruises or suspicious lesions. ?Neurologic: Grossly intact, no focal deficits, moving all 4 extremities. ?Psychiatric: Normal mood and affect. ? ?Laboratory Data: ?Lab Results  ?Component Value Date  ? WBC 11.5 (H) 01/02/2022  ? HGB 12.0 01/02/2022  ? HCT 36.9 01/02/2022  ? MCV 87.9 01/02/2022  ? PLT 228 01/02/2022  ? ? ?Lab  Results  ?Component Value Date  ? CREATININE 1.10 (H) 03/15/2022  ? ? ?No results found for: PSA ? ?No results found for: TESTOSTERONE ? ?Lab Results  ?Component Value Date  ? HGBA1C 7.1 (H) 11/26/2021  ? ? ?Urinalysis ?   ?Component Value Date/Time  ? COLORURINE YELLOW 01/02/2022 1548  ? APPEARANCEUR HAZY (A) 01/02/2022 1548  ? APPEARANCEUR Clear 10/27/2021 1524  ? LABSPEC 1.020 01/02/2022 1548  ? PHURINE 5.0 01/02/2022 1548  ? GLUCOSEU NEGATIVE 01/02/2022 1548  ? HGBUR NEGATIVE 01/02/2022 1548  ? BILIRUBINUR NEGATIVE 01/02/2022 1548  ? BILIRUBINUR Negative 10/27/2021 1524  ? KETONESUR NEGATIVE 01/02/2022 1548  ?  PROTEINUR NEGATIVE 01/02/2022 1548  ? NITRITE NEGATIVE 01/02/2022 1548  ? LEUKOCYTESUR LARGE (A) 01/02/2022 1548  ? ? ?Lab Results  ?Component Value Date  ? LABMICR Comment 10/27/2021  ? BACTERIA NONE SEEN 01/02/2022  ? ? ?Pertinent Imaging: ?CT 03/18/2022: Images reviewed and discussed with the patient ?No results found for this or any previous visit. ? ?No results found for this or any previous visit. ? ?No results found for this or any previous visit. ? ?No results found for this or any previous visit. ? ?No results found for this or any previous visit. ? ?No results found for this or any previous visit. ? ?No results found for this or any previous visit. ? ?No results found for this or any previous visit. ? ? ?Assessment & Plan:   ? ?1. Renal mass ?-RTC 3 months with CMP and CXR ?- Urinalysis, Routine w reflex microscopic ? ? ?No follow-ups on file. ? ?Nicolette Bang, MD ? ?[facility] ?  ?

## 2022-03-22 NOTE — Patient Instructions (Signed)
Kidney Cancer ?Kidney cancer is an abnormal growth of cells in one or both kidneys. The kidneys filter waste from your blood and produce urine. Kidney cancer may spread to other parts of your body. This type of cancer may also be called renal cell carcinoma. ?What are the causes? ?The cause of this condition is not always known. In some cases, abnormal changes to genes (genetic mutations) can cause cells to form cancer. ?What increases the risk? ?You may be more likely to develop kidney cancer if you: ?Are over age 60. The risk increases with age. ?Have a family history of kidney cancer. ?Are of African-American, Native American, or Native [place] descent. ?Smoke. ?Are female. ?Are obese. ?Have high blood pressure (hypertension). ?Have advanced kidney disease, especially if you need long-term dialysis. ?Have certain conditions that are passed from parent to child (inherited), such as von Hippel-Lindau disease, tuberous sclerosis, or hereditary papillary renal carcinoma. ?Have been exposed to certain chemicals. ?What are the signs or symptoms? ?In the early stages, kidney cancer does not cause symptoms. As the cancer grows, symptoms may include: ?Blood in the urine. ?Pain in the upper back or abdomen, just below the rib cage. You may feel pain on one or both sides of the body. ?Fatigue. ?Unexplained weight loss. ?Fever. ?How is this diagnosed? ?This condition may be diagnosed based on: ?Your symptoms and medical history. ?A physical exam. ?Blood and urine tests. ?X-rays. ?Imaging tests, such as CT scans, MRIs, and PET scans. ?Having dye injected into your blood through an IV, and then having X-rays taken of: ?Your kidneys and the rest of the organs involved in making and storing urine (intravenous pyelogram). ?Your blood vessels (angiogram). ?Removal and testing of a kidney tissue sample (biopsy). ?Your cancer will be assessed (staged), based on how severe it is and how much it has spread. ?How is this  treated? ?Treatment depends on the type and stage of the cancer. Treatment may include one or more of the following: ?Surgery. This may include surgery to remove: ?Just the tumor (nephron-sparing surgery). ?The entire kidney (nephrectomy). ?The kidney, some of the surrounding healthy tissue, nearby lymph nodes, and the adrenal gland in certain cases (radical nephrectomy). ?Medicines that kill cancer cells (chemotherapy). ?High-energy rays that kill cancer cells (radiation therapy). ?Targeted therapy. This targets specific parts of cancer cells and the area around them to block the growth and the spread of the cancer. Targeted therapy can help to limit the damage to healthy cells. ?Medicines that help your body's disease-fighting system (immune system) fight cancer cells (immunotherapy). ?Freezing cancer cells using gas or liquid that is delivered through a needle (cryoablation). ?Destroying cancer cells using high-energy radio waves that are delivered through a needle-like probe (radiofrequency ablation). ?A procedure to block the artery that supplies blood to the tumor, which kills the cancer cells (embolization). ?Follow these instructions at home: ?Eating and drinking ?Some of your treatments might affect your appetite and your ability to chew and swallow. If you are having problems eating, or if you do not have an appetite, meet with a diet and nutrition specialist (dietitian). ?If you have side effects that affect eating, it may help to: ?Eat smaller meals and snacks often. ?Drink high-nutrition and high-calorie shakes or supplements. ?Eat bland and soft foods that are easy to eat. ?Not eat foods that are hot, spicy, or hard to swallow. ?Lifestyle ?Do not drink alcohol. ?Do not use any products that contain nicotine or tobacco, such as cigarettes and e-cigarettes. If you need help   quitting, ask your health care provider. ?General instructions ? ?Take over-the-counter and prescription medicines only as told by  your health care provider. This includes vitamins, supplements, and herbal products. ?Consider joining a support group to help you cope with the stress of having kidney cancer. ?Work with your health care provider to manage any side effects of treatment. ?Keep all follow-up visits as told by your health care provider. This is important. ?Where to find more information ?American Cancer Society: https://www.cancer.org ?National Cancer Institute (NCI): https://www.cancer.gov ?Contact a health care provider if you: ?Notice that you bruise or bleed easily. ?Are losing weight without trying. ?Have new or increased fatigue or weakness. ?Get help right away if you have: ?Blood in your urine. ?A sudden increase in pain. ?A fever. ?Shortness of breath. ?Chest pain. ?Yellow skin or whites of your eyes (jaundice). ?Summary ?Kidney cancer is an abnormal growth of cells (tumor) in one or both kidneys. Tumors may spread to other parts of your body. ?In the early stages, kidney cancer does not cause symptoms. As the cancer grows, symptoms may include blood in the urine, pain in the upper back or abdomen, unexplained weight loss, fatigue, and fever. ?Treatment depends on the type and stage of the cancer. It may include surgery to remove the tumor, procedures and medicines to kill the cancer cells, or medicines to help your body fight cancer cells. ?This information is not intended to replace advice given to you by your health care provider. Make sure you discuss any questions you have with your health care provider. ?Document Revised: 08/03/2021 Document Reviewed: 08/03/2021 ?Elsevier Patient Education ? 2022 Elsevier Inc. ? ?

## 2022-03-23 LAB — URINALYSIS, ROUTINE W REFLEX MICROSCOPIC
Bilirubin, UA: NEGATIVE
Glucose, UA: NEGATIVE
Ketones, UA: NEGATIVE
Nitrite, UA: NEGATIVE
Protein,UA: NEGATIVE
RBC, UA: NEGATIVE
Specific Gravity, UA: 1.015 (ref 1.005–1.030)
Urobilinogen, Ur: 0.2 mg/dL (ref 0.2–1.0)
pH, UA: 5.5 (ref 5.0–7.5)

## 2022-03-24 ENCOUNTER — Encounter: Payer: Self-pay | Admitting: Urology

## 2022-03-30 DIAGNOSIS — I10 Essential (primary) hypertension: Secondary | ICD-10-CM | POA: Diagnosis not present

## 2022-03-30 DIAGNOSIS — E1122 Type 2 diabetes mellitus with diabetic chronic kidney disease: Secondary | ICD-10-CM | POA: Diagnosis not present

## 2022-03-30 DIAGNOSIS — E7849 Other hyperlipidemia: Secondary | ICD-10-CM | POA: Diagnosis not present

## 2022-03-30 DIAGNOSIS — N183 Chronic kidney disease, stage 3 unspecified: Secondary | ICD-10-CM | POA: Diagnosis not present

## 2022-03-30 DIAGNOSIS — E782 Mixed hyperlipidemia: Secondary | ICD-10-CM | POA: Diagnosis not present

## 2022-03-31 DIAGNOSIS — Z20822 Contact with and (suspected) exposure to covid-19: Secondary | ICD-10-CM | POA: Diagnosis not present

## 2022-04-04 DIAGNOSIS — G43909 Migraine, unspecified, not intractable, without status migrainosus: Secondary | ICD-10-CM | POA: Diagnosis not present

## 2022-04-04 DIAGNOSIS — E7849 Other hyperlipidemia: Secondary | ICD-10-CM | POA: Diagnosis not present

## 2022-04-04 DIAGNOSIS — M1000 Idiopathic gout, unspecified site: Secondary | ICD-10-CM | POA: Diagnosis not present

## 2022-04-04 DIAGNOSIS — E739 Lactose intolerance, unspecified: Secondary | ICD-10-CM | POA: Diagnosis not present

## 2022-04-04 DIAGNOSIS — K7581 Nonalcoholic steatohepatitis (NASH): Secondary | ICD-10-CM | POA: Diagnosis not present

## 2022-04-04 DIAGNOSIS — Z6827 Body mass index (BMI) 27.0-27.9, adult: Secondary | ICD-10-CM | POA: Diagnosis not present

## 2022-04-04 DIAGNOSIS — E1122 Type 2 diabetes mellitus with diabetic chronic kidney disease: Secondary | ICD-10-CM | POA: Diagnosis not present

## 2022-04-04 DIAGNOSIS — I10 Essential (primary) hypertension: Secondary | ICD-10-CM | POA: Diagnosis not present

## 2022-04-05 ENCOUNTER — Ambulatory Visit (HOSPITAL_COMMUNITY)
Admission: RE | Admit: 2022-04-05 | Discharge: 2022-04-05 | Disposition: A | Discharge status: Home / Self Care | Payer: Medicare Other | Source: Ambulatory Visit | Attending: Orthopedic Surgery | Admitting: Orthopedic Surgery

## 2022-04-05 DIAGNOSIS — M898X1 Other specified disorders of bone, shoulder: Secondary | ICD-10-CM | POA: Diagnosis not present

## 2022-04-05 MED ORDER — GADOBUTROL 1 MMOL/ML IV SOLN
6.0000 mL | Freq: Once | INTRAVENOUS | Status: AC | PRN
  Administered 2022-04-05: 7 mL via INTRAVENOUS

## 2022-04-06 DIAGNOSIS — E1122 Type 2 diabetes mellitus with diabetic chronic kidney disease: Secondary | ICD-10-CM | POA: Diagnosis not present

## 2022-04-08 DIAGNOSIS — Z20822 Contact with and (suspected) exposure to covid-19: Secondary | ICD-10-CM | POA: Diagnosis not present

## 2022-04-18 DIAGNOSIS — M898X1 Other specified disorders of bone, shoulder: Secondary | ICD-10-CM | POA: Diagnosis not present

## 2022-04-20 DIAGNOSIS — Z20822 Contact with and (suspected) exposure to covid-19: Secondary | ICD-10-CM | POA: Diagnosis not present

## 2022-04-24 DIAGNOSIS — I129 Hypertensive chronic kidney disease with stage 1 through stage 4 chronic kidney disease, or unspecified chronic kidney disease: Secondary | ICD-10-CM | POA: Diagnosis not present

## 2022-04-24 DIAGNOSIS — M1000 Idiopathic gout, unspecified site: Secondary | ICD-10-CM | POA: Diagnosis not present

## 2022-04-24 DIAGNOSIS — E1122 Type 2 diabetes mellitus with diabetic chronic kidney disease: Secondary | ICD-10-CM | POA: Diagnosis not present

## 2022-04-24 DIAGNOSIS — E7849 Other hyperlipidemia: Secondary | ICD-10-CM | POA: Diagnosis not present

## 2022-04-24 DIAGNOSIS — N183 Chronic kidney disease, stage 3 unspecified: Secondary | ICD-10-CM | POA: Diagnosis not present

## 2022-04-25 DIAGNOSIS — Z20822 Contact with and (suspected) exposure to covid-19: Secondary | ICD-10-CM | POA: Diagnosis not present

## 2022-04-26 DIAGNOSIS — Z20822 Contact with and (suspected) exposure to covid-19: Secondary | ICD-10-CM | POA: Diagnosis not present

## 2022-04-28 DIAGNOSIS — Z20822 Contact with and (suspected) exposure to covid-19: Secondary | ICD-10-CM | POA: Diagnosis not present

## 2022-04-28 DIAGNOSIS — Z1152 Encounter for screening for COVID-19: Secondary | ICD-10-CM | POA: Diagnosis not present

## 2022-05-02 DIAGNOSIS — Z20822 Contact with and (suspected) exposure to covid-19: Secondary | ICD-10-CM | POA: Diagnosis not present

## 2022-05-13 DIAGNOSIS — M25551 Pain in right hip: Secondary | ICD-10-CM | POA: Diagnosis not present

## 2022-05-13 DIAGNOSIS — M542 Cervicalgia: Secondary | ICD-10-CM | POA: Diagnosis not present

## 2022-05-13 DIAGNOSIS — I10 Essential (primary) hypertension: Secondary | ICD-10-CM | POA: Diagnosis not present

## 2022-05-13 DIAGNOSIS — Z6826 Body mass index (BMI) 26.0-26.9, adult: Secondary | ICD-10-CM | POA: Diagnosis not present

## 2022-05-25 DIAGNOSIS — B1920 Unspecified viral hepatitis C without hepatic coma: Secondary | ICD-10-CM | POA: Diagnosis not present

## 2022-05-25 DIAGNOSIS — I10 Essential (primary) hypertension: Secondary | ICD-10-CM | POA: Diagnosis not present

## 2022-05-25 DIAGNOSIS — E7849 Other hyperlipidemia: Secondary | ICD-10-CM | POA: Diagnosis not present

## 2022-05-25 DIAGNOSIS — E1122 Type 2 diabetes mellitus with diabetic chronic kidney disease: Secondary | ICD-10-CM | POA: Diagnosis not present

## 2022-05-25 DIAGNOSIS — R300 Dysuria: Secondary | ICD-10-CM | POA: Diagnosis not present

## 2022-05-25 DIAGNOSIS — N183 Chronic kidney disease, stage 3 unspecified: Secondary | ICD-10-CM | POA: Diagnosis not present

## 2022-05-25 DIAGNOSIS — E782 Mixed hyperlipidemia: Secondary | ICD-10-CM | POA: Diagnosis not present

## 2022-05-30 ENCOUNTER — Telehealth: Payer: Self-pay

## 2022-05-30 ENCOUNTER — Other Ambulatory Visit: Payer: Medicare Other

## 2022-05-30 ENCOUNTER — Other Ambulatory Visit: Payer: Self-pay

## 2022-05-30 DIAGNOSIS — Z1389 Encounter for screening for other disorder: Secondary | ICD-10-CM | POA: Diagnosis not present

## 2022-05-30 DIAGNOSIS — E7849 Other hyperlipidemia: Secondary | ICD-10-CM | POA: Diagnosis not present

## 2022-05-30 DIAGNOSIS — E739 Lactose intolerance, unspecified: Secondary | ICD-10-CM | POA: Diagnosis not present

## 2022-05-30 DIAGNOSIS — N2889 Other specified disorders of kidney and ureter: Secondary | ICD-10-CM | POA: Diagnosis not present

## 2022-05-30 DIAGNOSIS — R300 Dysuria: Secondary | ICD-10-CM | POA: Diagnosis not present

## 2022-05-30 DIAGNOSIS — E1122 Type 2 diabetes mellitus with diabetic chronic kidney disease: Secondary | ICD-10-CM | POA: Diagnosis not present

## 2022-05-30 DIAGNOSIS — M1000 Idiopathic gout, unspecified site: Secondary | ICD-10-CM | POA: Diagnosis not present

## 2022-05-30 DIAGNOSIS — I10 Essential (primary) hypertension: Secondary | ICD-10-CM | POA: Diagnosis not present

## 2022-05-30 DIAGNOSIS — K7581 Nonalcoholic steatohepatitis (NASH): Secondary | ICD-10-CM | POA: Diagnosis not present

## 2022-05-30 DIAGNOSIS — G43909 Migraine, unspecified, not intractable, without status migrainosus: Secondary | ICD-10-CM | POA: Diagnosis not present

## 2022-05-30 DIAGNOSIS — Z1331 Encounter for screening for depression: Secondary | ICD-10-CM | POA: Diagnosis not present

## 2022-05-30 DIAGNOSIS — Z0001 Encounter for general adult medical examination with abnormal findings: Secondary | ICD-10-CM | POA: Diagnosis not present

## 2022-05-30 DIAGNOSIS — Z23 Encounter for immunization: Secondary | ICD-10-CM | POA: Diagnosis not present

## 2022-05-30 NOTE — Telephone Encounter (Signed)
Routed you culture report in Cc'd chart

## 2022-05-30 NOTE — Telephone Encounter (Signed)
Patient left results from PCP office.   Wanted to let Dr. Alyson Ingles know her PCP put her on the following:  Prednisone - stopped taking - causes out of sort feeling  Macrobid '100mg'$  - stopped taking - fog state  Doxycycline HYC '100mg'$  - Currently taking -has taken 2 doses.   Thanks, [name]

## 2022-05-30 NOTE — Telephone Encounter (Signed)
Debra Vasquez,   Can you get office records from what she is talking about from PCP?

## 2022-05-31 LAB — COMPREHENSIVE METABOLIC PANEL
ALT: 42 IU/L — ABNORMAL HIGH (ref 0–32)
AST: 35 IU/L (ref 0–40)
Albumin/Globulin Ratio: 1.8 (ref 1.2–2.2)
Albumin: 3.9 g/dL (ref 3.7–4.7)
Alkaline Phosphatase: 150 IU/L — ABNORMAL HIGH (ref 44–121)
BUN/Creatinine Ratio: 19 (ref 12–28)
BUN: 29 mg/dL — ABNORMAL HIGH (ref 8–27)
Bilirubin Total: 0.9 mg/dL (ref 0.0–1.2)
CO2: 30 mmol/L — ABNORMAL HIGH (ref 20–29)
Calcium: 9.1 mg/dL (ref 8.7–10.3)
Chloride: 95 mmol/L — ABNORMAL LOW (ref 96–106)
Creatinine, Ser: 1.52 mg/dL — ABNORMAL HIGH (ref 0.57–1.00)
Globulin, Total: 2.2 g/dL (ref 1.5–4.5)
Glucose: 290 mg/dL — ABNORMAL HIGH (ref 70–99)
Potassium: 3.9 mmol/L (ref 3.5–5.2)
Sodium: 137 mmol/L (ref 134–144)
Total Protein: 6.1 g/dL (ref 6.0–8.5)
eGFR: 36 mL/min/{1.73_m2} — ABNORMAL LOW (ref >59)

## 2022-06-02 MED ORDER — DOXYCYCLINE HYCLATE 100 MG PO CAPS: 100.0000 mg | ORAL_CAPSULE | Freq: Two times a day (BID) | ORAL | 0 refills | Status: DC

## 2022-06-02 NOTE — Telephone Encounter (Signed)
Patient reports she took doxycyline '100mg'$  for 5 days from PCP and finished today.  Patient sent in two remaining days as ordered per Dr. Alyson Ingles.  Patient aware of two remaining days sent

## 2022-06-06 ENCOUNTER — Ambulatory Visit: Payer: Medicare Other | Admitting: Urology

## 2022-06-13 ENCOUNTER — Encounter: Payer: Self-pay | Admitting: Urology

## 2022-06-13 ENCOUNTER — Ambulatory Visit (HOSPITAL_COMMUNITY)
Admission: RE | Admit: 2022-06-13 | Discharge: 2022-06-13 | Disposition: A | Discharge status: Home / Self Care | Payer: Medicare Other | Source: Ambulatory Visit | Attending: Urology | Admitting: Urology

## 2022-06-13 ENCOUNTER — Ambulatory Visit (INDEPENDENT_AMBULATORY_CARE_PROVIDER_SITE_OTHER): Payer: Medicare Other | Admitting: Urology

## 2022-06-13 VITALS — BP 110/57 | HR 55

## 2022-06-13 DIAGNOSIS — Z85528 Personal history of other malignant neoplasm of kidney: Secondary | ICD-10-CM | POA: Diagnosis not present

## 2022-06-13 DIAGNOSIS — N2889 Other specified disorders of kidney and ureter: Secondary | ICD-10-CM | POA: Diagnosis not present

## 2022-06-13 DIAGNOSIS — J9811 Atelectasis: Secondary | ICD-10-CM | POA: Diagnosis not present

## 2022-06-13 DIAGNOSIS — C642 Malignant neoplasm of left kidney, except renal pelvis: Secondary | ICD-10-CM | POA: Diagnosis not present

## 2022-06-13 LAB — URINALYSIS, ROUTINE W REFLEX MICROSCOPIC
Bilirubin, UA: NEGATIVE
Glucose, UA: NEGATIVE
Nitrite, UA: NEGATIVE
Protein,UA: NEGATIVE
RBC, UA: NEGATIVE
Specific Gravity, UA: 1.01 (ref 1.005–1.030)
Urobilinogen, Ur: 0.2 mg/dL (ref 0.2–1.0)
pH, UA: 5 (ref 5.0–7.5)

## 2022-06-13 LAB — MICROSCOPIC EXAMINATION
Bacteria, UA: NONE SEEN
RBC, Urine: NONE SEEN /hpf (ref 0–2)
WBC, UA: NONE SEEN /hpf (ref 0–5)

## 2022-06-13 NOTE — Patient Instructions (Signed)
Kidney Cancer  Kidney cancer is an abnormal growth of cells in one or both kidneys. The kidneys filter waste from your blood and produce urine. Kidney cancer may spread to other parts of your body. This type of cancer may also be called renal cell carcinoma. What are the causes? The cause of this condition is not always known. In some cases, abnormal changes to genes (genetic mutations) can cause cells to form cancer. What increases the risk? You may be more likely to develop kidney cancer if you: Are over age 60. The risk increases with age. Have a family history of kidney cancer. Are of African-American, Native American, or Native [place] descent. Smoke. Are female. Are obese. Have high blood pressure (hypertension). Have advanced kidney disease, especially if you need long-term dialysis. Have certain conditions that are passed from parent to child (inherited), such as von Hippel-Lindau disease, tuberous sclerosis, or hereditary papillary renal carcinoma. Have been exposed to certain chemicals. What are the signs or symptoms? In the early stages, kidney cancer does not cause symptoms. As the cancer grows, symptoms may include: Blood in the urine. Pain in the upper back or abdomen, just below the rib cage. You may feel pain on one or both sides of the body. Fatigue. Unexplained weight loss. Fever. How is this diagnosed? This condition may be diagnosed based on: Your symptoms and medical history. A physical exam. Blood and urine tests. X-rays. Imaging tests, such as CT scans, MRIs, and PET scans. Having dye injected into your blood through an IV, and then having X-rays taken of: Your kidneys and the rest of the organs involved in making and storing urine (intravenous pyelogram). Your blood vessels (angiogram). Removal and testing of a kidney tissue sample (biopsy). Your cancer will be assessed (staged), based on how severe it is and how much it has spread. How is this  treated? Treatment depends on the type and stage of the cancer. Treatment may include one or more of the following: Surgery. This may include surgery to remove: Just the tumor (nephron-sparing surgery). The entire kidney (nephrectomy). The kidney, some of the surrounding healthy tissue, nearby lymph nodes, and the adrenal gland in certain cases (radical nephrectomy). Medicines that kill cancer cells (chemotherapy). High-energy rays that kill cancer cells (radiation therapy). Targeted therapy. This targets specific parts of cancer cells and the area around them to block the growth and the spread of the cancer. Targeted therapy can help to limit the damage to healthy cells. Medicines that help your body's disease-fighting system (immune system) fight cancer cells (immunotherapy). Freezing cancer cells using gas or liquid that is delivered through a needle (cryoablation). Destroying cancer cells using high-energy radio waves that are delivered through a needle-like probe (radiofrequency ablation). A procedure to block the artery that supplies blood to the tumor, which kills the cancer cells (embolization). Follow these instructions at home: Eating and drinking Some of your treatments might affect your appetite and your ability to chew and swallow. If you are having problems eating, or if you do not have an appetite, meet with a diet and nutrition specialist (dietitian). If you have side effects that affect eating, it may help to: Eat smaller meals and snacks often. Drink high-nutrition and high-calorie shakes or supplements. Eat bland and soft foods that are easy to eat. Not eat foods that are hot, spicy, or hard to swallow. Lifestyle Do not drink alcohol. Do not use any products that contain nicotine or tobacco, such as cigarettes and e-cigarettes. If you need   help quitting, ask your health care provider. General instructions  Take over-the-counter and prescription medicines only as told by  your health care provider. This includes vitamins, supplements, and herbal products. Consider joining a support group to help you cope with the stress of having kidney cancer. Work with your health care provider to manage any side effects of treatment. Keep all follow-up visits as told by your health care provider. This is important. Where to find more information American Cancer Society: https://www.cancer.org National Cancer Institute (NCI): https://www.cancer.gov Contact a health care provider if you: Notice that you bruise or bleed easily. Are losing weight without trying. Have new or increased fatigue or weakness. Get help right away if you have: Blood in your urine. A sudden increase in pain. A fever. Shortness of breath. Chest pain. Yellow skin or whites of your eyes (jaundice). Summary Kidney cancer is an abnormal growth of cells (tumor) in one or both kidneys. Tumors may spread to other parts of your body. In the early stages, kidney cancer does not cause symptoms. As the cancer grows, symptoms may include blood in the urine, pain in the upper back or abdomen, unexplained weight loss, fatigue, and fever. Treatment depends on the type and stage of the cancer. It may include surgery to remove the tumor, procedures and medicines to kill the cancer cells, or medicines to help your body fight cancer cells. This information is not intended to replace advice given to you by your health care provider. Make sure you discuss any questions you have with your health care provider. Document Revised: 08/03/2021 Document Reviewed: 08/03/2021 Elsevier Patient Education  2023 Elsevier Inc.  

## 2022-06-13 NOTE — Progress Notes (Unsigned)
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 CT 02/2022 showed no evidence of metastatic disease. Creatinine 1.52 up from baseline of 1.3-1.4. She has decreased energy. No recent Chest xray. No other complaints today.    PMH: Past Medical History:  Diagnosis Date   Arthritis    CHF (congestive heart failure) (HCC)    Diabetes mellitus without complication ([place])    Dysphagia    Family history of adverse reaction to anesthesia    Mother had problems waking up   GERD (gastroesophageal reflux disease)    Gout    Hypertension    Renal mass, left     Surgical History: Past Surgical History:  Procedure Laterality Date   ABDOMINAL HYSTERECTOMY     CARPAL TUNNEL RELEASE Left    CATARACT EXTRACTION W/PHACO Right 09/13/2019   Procedure: CATARACT EXTRACTION PHACO AND INTRAOCULAR LENS PLACEMENT RIGHT EYE;  Surgeon: Baruch Goldmann, MD;  Location: [facility];  Service: Ophthalmology;  Laterality: Right;  right   CATARACT EXTRACTION W/PHACO Left 09/27/2019   Procedure: CATARACT EXTRACTION PHACO AND INTRAOCULAR LENS PLACEMENT LEFT EYE  (CDE: 8.04);  Surgeon: Baruch Goldmann, MD;  Location: [facility];  Service: Ophthalmology;  Laterality: Left;   ESOPHAGOGASTRODUODENOSCOPY N/A 11/18/2020   Procedure: ESOPHAGOGASTRODUODENOSCOPY (EGD);  Surgeon: Daneil Dolin, MD;  Location: [facility];  Service: Endoscopy;  Laterality: N/A;  7:30am   MALONEY DILATION N/A 11/18/2020   Procedure: Venia Minks DILATION;  Surgeon: Daneil Dolin, MD;  Location: [facility];  Service: Endoscopy;  Laterality: N/A;   ROBOTIC ASSITED PARTIAL NEPHRECTOMY Left 12/07/2021   Procedure: XI ROBOTIC ASSITED PARTIAL NEPHRECTOMY;  Surgeon: Cleon Gustin, MD;  Location: [facility];  Service: Urology;   Laterality: Left;   TUBAL LIGATION     WISDOM TOOTH EXTRACTION      Home Medications:  Allergies as of 06/13/2022       Reactions   Gabapentin Other (See Comments)   syncope   Capoten [captopril] Cough   Celexa [citalopram Hydrobromide]    Passed out   Hydrocodone    "awake all night", felt terrible   Lexapro [escitalopram Oxalate]    "see psychodelic lights"   Lipitor [atorvastatin Calcium] Other (See Comments)   Achy legs   Tramadol    confusion        Medication List        Accurate as of June 13, 2022 11:58 AM. If you have any questions, ask your nurse or doctor.          allopurinol 300 MG tablet Commonly known as: ZYLOPRIM Take 300 mg by mouth in the morning.   cloNIDine 0.3 MG tablet Commonly known as: CATAPRES Take 0.3 mg by mouth 2 (two) times daily.   docusate sodium 100 MG capsule Commonly known as: COLACE Take 1 capsule (100 mg total) by mouth 2 (two) times daily.   doxycycline 100 MG capsule Commonly known as: VIBRAMYCIN Take 1 capsule (100 mg total) by mouth every 12 (twelve) hours.   furosemide 20 MG tablet Commonly known as: LASIX Take 20 mg by mouth daily as needed for edema.   glipiZIDE 5 MG 24 hr tablet Commonly known as: GLUCOTROL XL Take 5 mg by mouth in the morning.  Magnesium 400 MG Tabs Take 800 mg by mouth daily.   metFORMIN 500 MG 24 hr tablet Commonly known as: GLUCOPHAGE-XR Take 500 mg by mouth at bedtime.   metoprolol tartrate 50 MG tablet Commonly known as: LOPRESSOR Take 50 mg by mouth 2 (two) times daily.   oxyCODONE 5 MG immediate release tablet Commonly known as: Roxicodone Take 1-2 tablets (5-10 mg total) by mouth every 6 (six) hours as needed for moderate pain.   pantoprazole 40 MG tablet Commonly known as: PROTONIX TAKE 1 TABLET(40 MG) BY MOUTH TWICE DAILY BEFORE A MEAL   rosuvastatin 5 MG tablet Commonly known as: CRESTOR Take 5 mg by mouth at bedtime.   valsartan-hydrochlorothiazide 320-12.5 MG  tablet Commonly known as: DIOVAN-HCT Take 1 tablet by mouth daily.        Allergies:  Allergies  Allergen Reactions   Gabapentin Other (See Comments)    syncope   Capoten [Captopril] Cough   Celexa [Citalopram Hydrobromide]     Passed out   Hydrocodone     "awake all night", felt terrible   Lexapro [Escitalopram Oxalate]     "see psychodelic lights"   Lipitor [Atorvastatin Calcium] Other (See Comments)    Achy legs   Tramadol     confusion    Family History: Family History  Problem Relation Age of Onset   Hyperlipidemia Mother    Heart attack Mother    Parkinson's disease Father    Stroke Father 38   Hyperlipidemia Sister    Alcoholism Brother    Diabetes Brother    Heart failure Paternal Grandmother    Colon cancer Neg Hx    Gastric cancer Neg Hx    Esophageal cancer Neg Hx    Liver disease Neg Hx    Pancreatic disease Neg Hx     Social History:  reports that she quit smoking about 36 years ago. Her smoking use included cigarettes. She has a 3.00 pack-year smoking history. She has never used smokeless tobacco. She reports that she does not currently use alcohol. She reports that she does not use drugs.  ROS: All other review of systems were reviewed and are negative except what is noted above in HPI  Physical Exam: BP (!) 110/57   Pulse (!) 55   Constitutional:  Alert and oriented, No acute distress. HEENT: [place] AT, moist mucus membranes.  Trachea midline, no masses. Cardiovascular: No clubbing, cyanosis, or edema. Respiratory: Normal respiratory effort, no increased work of breathing. GI: Abdomen is soft, nontender, nondistended, no abdominal masses GU: No CVA tenderness.  Lymph: No cervical or inguinal lymphadenopathy. Skin: No rashes, bruises or suspicious lesions. Neurologic: Grossly intact, no focal deficits, moving all 4 extremities. Psychiatric: Normal mood and affect.  Laboratory Data: Lab Results  Component Value Date   WBC 11.5 (H) 01/02/2022    HGB 12.0 01/02/2022   HCT 36.9 01/02/2022   MCV 87.9 01/02/2022   PLT 228 01/02/2022    Lab Results  Component Value Date   CREATININE 1.52 (H) 05/30/2022    No results found for: "PSA"  No results found for: "TESTOSTERONE"  Lab Results  Component Value Date   HGBA1C 7.1 (H) 11/26/2021    Urinalysis    Component Value Date/Time   COLORURINE YELLOW 01/02/2022 1548   APPEARANCEUR Clear 03/22/2022 1115   LABSPEC 1.020 01/02/2022 1548   PHURINE 5.0 01/02/2022 1548   GLUCOSEU Negative 03/22/2022 1115   HGBUR NEGATIVE 01/02/2022 [place] Negative 03/22/2022 1115   KETONESUR  NEGATIVE 01/02/2022 1548   PROTEINUR Negative 03/22/2022 1115   PROTEINUR NEGATIVE 01/02/2022 1548   NITRITE Negative 03/22/2022 1115   NITRITE NEGATIVE 01/02/2022 1548   LEUKOCYTESUR Trace (A) 03/22/2022 1115   LEUKOCYTESUR LARGE (A) 01/02/2022 1548    Lab Results  Component Value Date   LABMICR Comment 10/27/2021   BACTERIA NONE SEEN 01/02/2022    Pertinent Imaging:  No results found for this or any previous visit.  No results found for this or any previous visit.  No results found for this or any previous visit.  No results found for this or any previous visit.  No results found for this or any previous visit.  No results found for this or any previous visit.  No results found for this or any previous visit.  No results found for this or any previous visit.   Assessment & Plan:    1. Renal cancer, left ([place]) -RTC 3 monthsx with CMP, CXR, and CRT abd w/wo contrast - Urinalysis, Routine w reflex microscopic    No follow-ups on file.  Nicolette Bang, MD  [facility] [place]

## 2022-06-14 DIAGNOSIS — N183 Chronic kidney disease, stage 3 unspecified: Secondary | ICD-10-CM | POA: Diagnosis not present

## 2022-06-14 DIAGNOSIS — K7581 Nonalcoholic steatohepatitis (NASH): Secondary | ICD-10-CM | POA: Diagnosis not present

## 2022-06-22 ENCOUNTER — Other Ambulatory Visit: Payer: Self-pay

## 2022-06-24 DIAGNOSIS — E1122 Type 2 diabetes mellitus with diabetic chronic kidney disease: Secondary | ICD-10-CM | POA: Diagnosis not present

## 2022-06-24 DIAGNOSIS — N183 Chronic kidney disease, stage 3 unspecified: Secondary | ICD-10-CM | POA: Diagnosis not present

## 2022-06-24 DIAGNOSIS — E7849 Other hyperlipidemia: Secondary | ICD-10-CM | POA: Diagnosis not present

## 2022-06-27 ENCOUNTER — Other Ambulatory Visit (HOSPITAL_COMMUNITY): Payer: Self-pay | Admitting: Family Medicine

## 2022-06-27 ENCOUNTER — Other Ambulatory Visit: Payer: Self-pay | Admitting: Family Medicine

## 2022-06-27 DIAGNOSIS — M545 Low back pain, unspecified: Secondary | ICD-10-CM

## 2022-07-25 DIAGNOSIS — I129 Hypertensive chronic kidney disease with stage 1 through stage 4 chronic kidney disease, or unspecified chronic kidney disease: Secondary | ICD-10-CM | POA: Diagnosis not present

## 2022-07-25 DIAGNOSIS — N183 Chronic kidney disease, stage 3 unspecified: Secondary | ICD-10-CM | POA: Diagnosis not present

## 2022-07-25 DIAGNOSIS — E7849 Other hyperlipidemia: Secondary | ICD-10-CM | POA: Diagnosis not present

## 2022-07-25 DIAGNOSIS — E1122 Type 2 diabetes mellitus with diabetic chronic kidney disease: Secondary | ICD-10-CM | POA: Diagnosis not present

## 2022-07-26 ENCOUNTER — Ambulatory Visit (HOSPITAL_COMMUNITY)
Admission: RE | Admit: 2022-07-26 | Discharge: 2022-07-26 | Disposition: A | Discharge status: Home / Self Care | Payer: Medicare Other | Source: Ambulatory Visit | Attending: Family Medicine | Admitting: Family Medicine

## 2022-07-26 DIAGNOSIS — M545 Low back pain, unspecified: Secondary | ICD-10-CM | POA: Insufficient documentation

## 2022-07-26 DIAGNOSIS — M5126 Other intervertebral disc displacement, lumbar region: Secondary | ICD-10-CM | POA: Diagnosis not present

## 2022-08-01 DIAGNOSIS — Z7409 Other reduced mobility: Secondary | ICD-10-CM | POA: Diagnosis not present

## 2022-08-01 DIAGNOSIS — M79604 Pain in right leg: Secondary | ICD-10-CM | POA: Diagnosis not present

## 2022-08-01 DIAGNOSIS — R2689 Other abnormalities of gait and mobility: Secondary | ICD-10-CM | POA: Diagnosis not present

## 2022-08-01 DIAGNOSIS — M6281 Muscle weakness (generalized): Secondary | ICD-10-CM | POA: Diagnosis not present

## 2022-08-01 DIAGNOSIS — M25571 Pain in right ankle and joints of right foot: Secondary | ICD-10-CM | POA: Diagnosis not present

## 2022-08-01 DIAGNOSIS — Z9181 History of falling: Secondary | ICD-10-CM | POA: Diagnosis not present

## 2022-08-01 DIAGNOSIS — R29898 Other symptoms and signs involving the musculoskeletal system: Secondary | ICD-10-CM | POA: Diagnosis not present

## 2022-08-01 DIAGNOSIS — M47819 Spondylosis without myelopathy or radiculopathy, site unspecified: Secondary | ICD-10-CM | POA: Diagnosis not present

## 2022-08-01 DIAGNOSIS — M25551 Pain in right hip: Secondary | ICD-10-CM | POA: Diagnosis not present

## 2022-08-01 DIAGNOSIS — M25561 Pain in right knee: Secondary | ICD-10-CM | POA: Diagnosis not present

## 2022-08-01 DIAGNOSIS — M2569 Stiffness of other specified joint, not elsewhere classified: Secondary | ICD-10-CM | POA: Diagnosis not present

## 2022-08-01 DIAGNOSIS — M79651 Pain in right thigh: Secondary | ICD-10-CM | POA: Diagnosis not present

## 2022-08-05 DIAGNOSIS — M47819 Spondylosis without myelopathy or radiculopathy, site unspecified: Secondary | ICD-10-CM | POA: Diagnosis not present

## 2022-08-05 DIAGNOSIS — Z9181 History of falling: Secondary | ICD-10-CM | POA: Diagnosis not present

## 2022-08-05 DIAGNOSIS — Z7409 Other reduced mobility: Secondary | ICD-10-CM | POA: Diagnosis not present

## 2022-08-05 DIAGNOSIS — R2689 Other abnormalities of gait and mobility: Secondary | ICD-10-CM | POA: Diagnosis not present

## 2022-08-05 DIAGNOSIS — M6281 Muscle weakness (generalized): Secondary | ICD-10-CM | POA: Diagnosis not present

## 2022-08-05 DIAGNOSIS — M79604 Pain in right leg: Secondary | ICD-10-CM | POA: Diagnosis not present

## 2022-08-08 DIAGNOSIS — M47819 Spondylosis without myelopathy or radiculopathy, site unspecified: Secondary | ICD-10-CM | POA: Diagnosis not present

## 2022-08-08 DIAGNOSIS — Z9181 History of falling: Secondary | ICD-10-CM | POA: Diagnosis not present

## 2022-08-08 DIAGNOSIS — M6281 Muscle weakness (generalized): Secondary | ICD-10-CM | POA: Diagnosis not present

## 2022-08-08 DIAGNOSIS — M79604 Pain in right leg: Secondary | ICD-10-CM | POA: Diagnosis not present

## 2022-08-08 DIAGNOSIS — R2689 Other abnormalities of gait and mobility: Secondary | ICD-10-CM | POA: Diagnosis not present

## 2022-08-08 DIAGNOSIS — Z7409 Other reduced mobility: Secondary | ICD-10-CM | POA: Diagnosis not present

## 2022-08-12 DIAGNOSIS — M47819 Spondylosis without myelopathy or radiculopathy, site unspecified: Secondary | ICD-10-CM | POA: Diagnosis not present

## 2022-08-12 DIAGNOSIS — Z7409 Other reduced mobility: Secondary | ICD-10-CM | POA: Diagnosis not present

## 2022-08-12 DIAGNOSIS — M79604 Pain in right leg: Secondary | ICD-10-CM | POA: Diagnosis not present

## 2022-08-12 DIAGNOSIS — M6281 Muscle weakness (generalized): Secondary | ICD-10-CM | POA: Diagnosis not present

## 2022-08-12 DIAGNOSIS — Z9181 History of falling: Secondary | ICD-10-CM | POA: Diagnosis not present

## 2022-08-12 DIAGNOSIS — R2689 Other abnormalities of gait and mobility: Secondary | ICD-10-CM | POA: Diagnosis not present

## 2022-08-15 DIAGNOSIS — M6281 Muscle weakness (generalized): Secondary | ICD-10-CM | POA: Diagnosis not present

## 2022-08-15 DIAGNOSIS — M47819 Spondylosis without myelopathy or radiculopathy, site unspecified: Secondary | ICD-10-CM | POA: Diagnosis not present

## 2022-08-15 DIAGNOSIS — Z9181 History of falling: Secondary | ICD-10-CM | POA: Diagnosis not present

## 2022-08-15 DIAGNOSIS — M79604 Pain in right leg: Secondary | ICD-10-CM | POA: Diagnosis not present

## 2022-08-15 DIAGNOSIS — R2689 Other abnormalities of gait and mobility: Secondary | ICD-10-CM | POA: Diagnosis not present

## 2022-08-15 DIAGNOSIS — Z7409 Other reduced mobility: Secondary | ICD-10-CM | POA: Diagnosis not present

## 2022-08-19 DIAGNOSIS — M79604 Pain in right leg: Secondary | ICD-10-CM | POA: Diagnosis not present

## 2022-08-19 DIAGNOSIS — M47819 Spondylosis without myelopathy or radiculopathy, site unspecified: Secondary | ICD-10-CM | POA: Diagnosis not present

## 2022-08-19 DIAGNOSIS — Z9181 History of falling: Secondary | ICD-10-CM | POA: Diagnosis not present

## 2022-08-19 DIAGNOSIS — M6281 Muscle weakness (generalized): Secondary | ICD-10-CM | POA: Diagnosis not present

## 2022-08-19 DIAGNOSIS — R2689 Other abnormalities of gait and mobility: Secondary | ICD-10-CM | POA: Diagnosis not present

## 2022-08-19 DIAGNOSIS — Z7409 Other reduced mobility: Secondary | ICD-10-CM | POA: Diagnosis not present

## 2022-08-22 DIAGNOSIS — Z9181 History of falling: Secondary | ICD-10-CM | POA: Diagnosis not present

## 2022-08-22 DIAGNOSIS — M6281 Muscle weakness (generalized): Secondary | ICD-10-CM | POA: Diagnosis not present

## 2022-08-22 DIAGNOSIS — R2689 Other abnormalities of gait and mobility: Secondary | ICD-10-CM | POA: Diagnosis not present

## 2022-08-22 DIAGNOSIS — M79604 Pain in right leg: Secondary | ICD-10-CM | POA: Diagnosis not present

## 2022-08-22 DIAGNOSIS — M47819 Spondylosis without myelopathy or radiculopathy, site unspecified: Secondary | ICD-10-CM | POA: Diagnosis not present

## 2022-08-22 DIAGNOSIS — Z7409 Other reduced mobility: Secondary | ICD-10-CM | POA: Diagnosis not present

## 2022-08-26 DIAGNOSIS — Z7409 Other reduced mobility: Secondary | ICD-10-CM | POA: Diagnosis not present

## 2022-08-26 DIAGNOSIS — M6281 Muscle weakness (generalized): Secondary | ICD-10-CM | POA: Diagnosis not present

## 2022-08-26 DIAGNOSIS — R2689 Other abnormalities of gait and mobility: Secondary | ICD-10-CM | POA: Diagnosis not present

## 2022-08-26 DIAGNOSIS — Z9181 History of falling: Secondary | ICD-10-CM | POA: Diagnosis not present

## 2022-08-26 DIAGNOSIS — M47819 Spondylosis without myelopathy or radiculopathy, site unspecified: Secondary | ICD-10-CM | POA: Diagnosis not present

## 2022-08-26 DIAGNOSIS — M79604 Pain in right leg: Secondary | ICD-10-CM | POA: Diagnosis not present

## 2022-08-30 DIAGNOSIS — Z9181 History of falling: Secondary | ICD-10-CM | POA: Diagnosis not present

## 2022-08-30 DIAGNOSIS — Z7409 Other reduced mobility: Secondary | ICD-10-CM | POA: Diagnosis not present

## 2022-08-30 DIAGNOSIS — R2689 Other abnormalities of gait and mobility: Secondary | ICD-10-CM | POA: Diagnosis not present

## 2022-08-30 DIAGNOSIS — M47819 Spondylosis without myelopathy or radiculopathy, site unspecified: Secondary | ICD-10-CM | POA: Diagnosis not present

## 2022-08-30 DIAGNOSIS — M79604 Pain in right leg: Secondary | ICD-10-CM | POA: Diagnosis not present

## 2022-08-30 DIAGNOSIS — M6281 Muscle weakness (generalized): Secondary | ICD-10-CM | POA: Diagnosis not present

## 2022-09-01 DIAGNOSIS — M6281 Muscle weakness (generalized): Secondary | ICD-10-CM | POA: Diagnosis not present

## 2022-09-01 DIAGNOSIS — R29898 Other symptoms and signs involving the musculoskeletal system: Secondary | ICD-10-CM | POA: Diagnosis not present

## 2022-09-01 DIAGNOSIS — M25561 Pain in right knee: Secondary | ICD-10-CM | POA: Diagnosis not present

## 2022-09-01 DIAGNOSIS — M79651 Pain in right thigh: Secondary | ICD-10-CM | POA: Diagnosis not present

## 2022-09-01 DIAGNOSIS — R2689 Other abnormalities of gait and mobility: Secondary | ICD-10-CM | POA: Diagnosis not present

## 2022-09-01 DIAGNOSIS — M2569 Stiffness of other specified joint, not elsewhere classified: Secondary | ICD-10-CM | POA: Diagnosis not present

## 2022-09-01 DIAGNOSIS — Z9181 History of falling: Secondary | ICD-10-CM | POA: Diagnosis not present

## 2022-09-01 DIAGNOSIS — M47819 Spondylosis without myelopathy or radiculopathy, site unspecified: Secondary | ICD-10-CM | POA: Diagnosis not present

## 2022-09-01 DIAGNOSIS — M79604 Pain in right leg: Secondary | ICD-10-CM | POA: Diagnosis not present

## 2022-09-01 DIAGNOSIS — Z7409 Other reduced mobility: Secondary | ICD-10-CM | POA: Diagnosis not present

## 2022-09-01 DIAGNOSIS — M25571 Pain in right ankle and joints of right foot: Secondary | ICD-10-CM | POA: Diagnosis not present

## 2022-09-01 DIAGNOSIS — M25551 Pain in right hip: Secondary | ICD-10-CM | POA: Diagnosis not present

## 2022-09-02 ENCOUNTER — Ambulatory Visit (HOSPITAL_COMMUNITY)
Admission: RE | Admit: 2022-09-02 | Discharge: 2022-09-02 | Disposition: A | Discharge status: Home / Self Care | Payer: Medicare Other | Source: Ambulatory Visit | Attending: Urology | Admitting: Urology

## 2022-09-02 ENCOUNTER — Other Ambulatory Visit: Payer: Medicare Other

## 2022-09-02 ENCOUNTER — Encounter (HOSPITAL_COMMUNITY): Payer: Self-pay

## 2022-09-02 DIAGNOSIS — C642 Malignant neoplasm of left kidney, except renal pelvis: Secondary | ICD-10-CM | POA: Insufficient documentation

## 2022-09-02 DIAGNOSIS — K7460 Unspecified cirrhosis of liver: Secondary | ICD-10-CM | POA: Diagnosis not present

## 2022-09-02 DIAGNOSIS — K7689 Other specified diseases of liver: Secondary | ICD-10-CM | POA: Diagnosis not present

## 2022-09-02 DIAGNOSIS — D1800 Hemangioma unspecified site: Secondary | ICD-10-CM | POA: Diagnosis not present

## 2022-09-02 LAB — POCT I-STAT CREATININE: Creatinine, Ser: 1.4 mg/dL — ABNORMAL HIGH (ref 0.44–1.00)

## 2022-09-02 MED ORDER — IOHEXOL 300 MG/ML  SOLN
100.0000 mL | Freq: Once | INTRAMUSCULAR | Status: AC | PRN
  Administered 2022-09-02: 80 mL via INTRAVENOUS

## 2022-09-03 LAB — COMPREHENSIVE METABOLIC PANEL
ALT: 23 IU/L (ref 0–32)
AST: 38 IU/L (ref 0–40)
Albumin/Globulin Ratio: 2 (ref 1.2–2.2)
Albumin: 4.3 g/dL (ref 3.8–4.8)
Alkaline Phosphatase: 101 IU/L (ref 44–121)
BUN/Creatinine Ratio: 17 (ref 12–28)
BUN: 20 mg/dL (ref 8–27)
Bilirubin Total: 0.5 mg/dL (ref 0.0–1.2)
CO2: 23 mmol/L (ref 20–29)
Calcium: 9.2 mg/dL (ref 8.7–10.3)
Chloride: 94 mmol/L — ABNORMAL LOW (ref 96–106)
Creatinine, Ser: 1.19 mg/dL — ABNORMAL HIGH (ref 0.57–1.00)
Globulin, Total: 2.2 g/dL (ref 1.5–4.5)
Glucose: 132 mg/dL — ABNORMAL HIGH (ref 70–99)
Potassium: 4.1 mmol/L (ref 3.5–5.2)
Sodium: 137 mmol/L (ref 134–144)
Total Protein: 6.5 g/dL (ref 6.0–8.5)
eGFR: 48 mL/min/{1.73_m2} — ABNORMAL LOW (ref >59)

## 2022-09-05 DIAGNOSIS — Z1231 Encounter for screening mammogram for malignant neoplasm of breast: Secondary | ICD-10-CM | POA: Diagnosis not present

## 2022-09-07 DIAGNOSIS — M6281 Muscle weakness (generalized): Secondary | ICD-10-CM | POA: Diagnosis not present

## 2022-09-07 DIAGNOSIS — Z9181 History of falling: Secondary | ICD-10-CM | POA: Diagnosis not present

## 2022-09-07 DIAGNOSIS — Z7409 Other reduced mobility: Secondary | ICD-10-CM | POA: Diagnosis not present

## 2022-09-07 DIAGNOSIS — M47819 Spondylosis without myelopathy or radiculopathy, site unspecified: Secondary | ICD-10-CM | POA: Diagnosis not present

## 2022-09-07 DIAGNOSIS — M79604 Pain in right leg: Secondary | ICD-10-CM | POA: Diagnosis not present

## 2022-09-07 DIAGNOSIS — R2689 Other abnormalities of gait and mobility: Secondary | ICD-10-CM | POA: Diagnosis not present

## 2022-09-09 DIAGNOSIS — Z7409 Other reduced mobility: Secondary | ICD-10-CM | POA: Diagnosis not present

## 2022-09-09 DIAGNOSIS — Z9181 History of falling: Secondary | ICD-10-CM | POA: Diagnosis not present

## 2022-09-09 DIAGNOSIS — R2689 Other abnormalities of gait and mobility: Secondary | ICD-10-CM | POA: Diagnosis not present

## 2022-09-09 DIAGNOSIS — M6281 Muscle weakness (generalized): Secondary | ICD-10-CM | POA: Diagnosis not present

## 2022-09-09 DIAGNOSIS — M79604 Pain in right leg: Secondary | ICD-10-CM | POA: Diagnosis not present

## 2022-09-09 DIAGNOSIS — M47819 Spondylosis without myelopathy or radiculopathy, site unspecified: Secondary | ICD-10-CM | POA: Diagnosis not present

## 2022-09-13 DIAGNOSIS — M6281 Muscle weakness (generalized): Secondary | ICD-10-CM | POA: Diagnosis not present

## 2022-09-13 DIAGNOSIS — R2689 Other abnormalities of gait and mobility: Secondary | ICD-10-CM | POA: Diagnosis not present

## 2022-09-13 DIAGNOSIS — M79604 Pain in right leg: Secondary | ICD-10-CM | POA: Diagnosis not present

## 2022-09-13 DIAGNOSIS — Z7409 Other reduced mobility: Secondary | ICD-10-CM | POA: Diagnosis not present

## 2022-09-13 DIAGNOSIS — M47819 Spondylosis without myelopathy or radiculopathy, site unspecified: Secondary | ICD-10-CM | POA: Diagnosis not present

## 2022-09-13 DIAGNOSIS — Z9181 History of falling: Secondary | ICD-10-CM | POA: Diagnosis not present

## 2022-09-14 ENCOUNTER — Encounter: Payer: Self-pay | Admitting: Urology

## 2022-09-14 ENCOUNTER — Ambulatory Visit (INDEPENDENT_AMBULATORY_CARE_PROVIDER_SITE_OTHER): Payer: Medicare Other | Admitting: Urology

## 2022-09-14 VITALS — BP 140/75 | HR 62

## 2022-09-14 DIAGNOSIS — C642 Malignant neoplasm of left kidney, except renal pelvis: Secondary | ICD-10-CM

## 2022-09-14 DIAGNOSIS — Z85528 Personal history of other malignant neoplasm of kidney: Secondary | ICD-10-CM

## 2022-09-14 NOTE — Patient Instructions (Signed)
Kidney Cancer  Kidney cancer is an abnormal growth of cells in one or both kidneys. The kidneys filter waste from your blood and produce urine. Kidney cancer may spread to other parts of your body. This type of cancer may also be called renal cell carcinoma. What are the causes? The cause of this condition is not always known. In some cases, abnormal changes to genes (genetic mutations) can cause cells to form cancer. What increases the risk? You may be more likely to develop kidney cancer if you: Are over age 57. The risk increases with age. Have a family history of kidney cancer. Are of African-American, Native American, or Native [place] descent. Smoke. Are female. Are obese. Have high blood pressure (hypertension). Have advanced kidney disease, especially if you need long-term dialysis. Have certain conditions that are passed from parent to child (inherited), such as von Hippel-Lindau disease, tuberous sclerosis, or hereditary papillary renal carcinoma. Have been exposed to certain chemicals. What are the signs or symptoms? In the early stages, kidney cancer does not cause symptoms. As the cancer grows, symptoms may include: Blood in the urine. Pain in the upper back or abdomen, just below the rib cage. You may feel pain on one or both sides of the body. Fatigue. Unexplained weight loss. Fever. How is this diagnosed? This condition may be diagnosed based on: Your symptoms and medical history. A physical exam. Blood and urine tests. X-rays. Imaging tests, such as CT scans, MRIs, and PET scans. Having dye injected into your blood through an IV, and then having X-rays taken of: Your kidneys and the rest of the organs involved in making and storing urine (intravenous pyelogram). Your blood vessels (angiogram). Removal and testing of a kidney tissue sample (biopsy). Your cancer will be assessed (staged), based on how severe it is and how much it has spread. How is this  treated? Treatment depends on the type and stage of the cancer. Treatment may include one or more of the following: Surgery. This may include surgery to remove: Just the tumor (nephron-sparing surgery). The entire kidney (nephrectomy). The kidney, some of the surrounding healthy tissue, nearby lymph nodes, and the adrenal gland in certain cases (radical nephrectomy). Medicines that kill cancer cells (chemotherapy). High-energy rays that kill cancer cells (radiation therapy). Targeted therapy. This targets specific parts of cancer cells and the area around them to block the growth and the spread of the cancer. Targeted therapy can help to limit the damage to healthy cells. Medicines that help your body's disease-fighting system (immune system) fight cancer cells (immunotherapy). Freezing cancer cells using gas or liquid that is delivered through a needle (cryoablation). Destroying cancer cells using high-energy radio waves that are delivered through a needle-like probe (radiofrequency ablation). A procedure to block the artery that supplies blood to the tumor, which kills the cancer cells (embolization). Follow these instructions at home: Eating and drinking Some of your treatments might affect your appetite and your ability to chew and swallow. If you are having problems eating, or if you do not have an appetite, meet with a diet and nutrition specialist (dietitian). If you have side effects that affect eating, it may help to: Eat smaller meals and snacks often. Drink high-nutrition and high-calorie shakes or supplements. Eat bland and soft foods that are easy to eat. Not eat foods that are hot, spicy, or hard to swallow. Lifestyle Do not drink alcohol. Do not use any products that contain nicotine or tobacco, such as cigarettes and e-cigarettes. If you need  help quitting, ask your health care provider. General instructions  Take over-the-counter and prescription medicines only as told by  your health care provider. This includes vitamins, supplements, and herbal products. Consider joining a support group to help you cope with the stress of having kidney cancer. Work with your health care provider to manage any side effects of treatment. Keep all follow-up visits as told by your health care provider. This is important. Where to find more information American Cancer Society: https://www.cancer.[place] ([place]): https://www.cancer.gov Contact a health care provider if you: Notice that you bruise or bleed easily. Are losing weight without trying. Have new or increased fatigue or weakness. Get help right away if you have: Blood in your urine. A sudden increase in pain. A fever. Shortness of breath. Chest pain. Yellow skin or whites of your eyes (jaundice). Summary Kidney cancer is an abnormal growth of cells (tumor) in one or both kidneys. Tumors may spread to other parts of your body. In the early stages, kidney cancer does not cause symptoms. As the cancer grows, symptoms may include blood in the urine, pain in the upper back or abdomen, unexplained weight loss, fatigue, and fever. Treatment depends on the type and stage of the cancer. It may include surgery to remove the tumor, procedures and medicines to kill the cancer cells, or medicines to help your body fight cancer cells. This information is not intended to replace advice given to you by your health care provider. Make sure you discuss any questions you have with your health care provider. Document Revised: 03/01/2022 Document Reviewed: 08/03/2021 Elsevier Patient Education  [place].

## 2022-09-14 NOTE — Progress Notes (Signed)
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 Pathology T3a. Creatinine 1.2. CT abd shows no evidence of metastatic disease. No flank pain. CXR normal.    PMH: Past Medical History:  Diagnosis Date   Arthritis    CHF (congestive heart failure) (HCC)    Diabetes mellitus without complication ([place])    Dysphagia    Family history of adverse reaction to anesthesia    Mother had problems waking up   GERD (gastroesophageal reflux disease)    Gout    Hypertension    Renal mass, left     Surgical History: Past Surgical History:  Procedure Laterality Date   ABDOMINAL HYSTERECTOMY     CARPAL TUNNEL RELEASE Left    CATARACT EXTRACTION W/PHACO Right 09/13/2019   Procedure: CATARACT EXTRACTION PHACO AND INTRAOCULAR LENS PLACEMENT RIGHT EYE;  Surgeon: Baruch Goldmann, MD;  Location: [facility];  Service: Ophthalmology;  Laterality: Right;  right   CATARACT EXTRACTION W/PHACO Left 09/27/2019   Procedure: CATARACT EXTRACTION PHACO AND INTRAOCULAR LENS PLACEMENT LEFT EYE  (CDE: 8.04);  Surgeon: Baruch Goldmann, MD;  Location: [facility];  Service: Ophthalmology;  Laterality: Left;   ESOPHAGOGASTRODUODENOSCOPY N/A 11/18/2020   Procedure: ESOPHAGOGASTRODUODENOSCOPY (EGD);  Surgeon: Daneil Dolin, MD;  Location: [facility];  Service: Endoscopy;  Laterality: N/A;  7:30am   MALONEY DILATION N/A 11/18/2020   Procedure: Venia Minks DILATION;  Surgeon: Daneil Dolin, MD;  Location: [facility];  Service: Endoscopy;  Laterality: N/A;   ROBOTIC ASSITED PARTIAL NEPHRECTOMY Left 12/07/2021   Procedure: XI ROBOTIC ASSITED PARTIAL NEPHRECTOMY;  Surgeon: Cleon Gustin, MD;  Location: [facility];  Service: Urology;  Laterality: Left;   TUBAL LIGATION     WISDOM TOOTH EXTRACTION      Home  Medications:  Allergies as of 09/14/2022       Reactions   Gabapentin Other (See Comments)   syncope   Capoten [captopril] Cough   Celexa [citalopram Hydrobromide]    Passed out   Hydrocodone    "awake all night", felt terrible   Lexapro [escitalopram Oxalate]    "see psychodelic lights"   Lipitor [atorvastatin Calcium] Other (See Comments)   Achy legs   Tramadol    confusion        Medication List        Accurate as of September 14, 2022 11:08 AM. If you have any questions, ask your nurse or doctor.          allopurinol 300 MG tablet Commonly known as: ZYLOPRIM Take 300 mg by mouth in the morning.   cloNIDine 0.3 MG tablet Commonly known as: CATAPRES Take 0.3 mg by mouth 2 (two) times daily.   docusate sodium 100 MG capsule Commonly known as: COLACE Take 1 capsule (100 mg total) by mouth 2 (two) times daily.   doxycycline 100 MG capsule Commonly known as: VIBRAMYCIN Take 1 capsule (100 mg total) by mouth every 12 (twelve) hours.   furosemide 20 MG tablet Commonly known as: LASIX Take 20 mg by mouth daily as needed for edema.   glipiZIDE 5 MG 24 hr tablet Commonly known as: GLUCOTROL XL Take 5 mg by mouth in the morning.   Magnesium 400 MG Tabs Take 800 mg by mouth daily.   metFORMIN 500  MG 24 hr tablet Commonly known as: GLUCOPHAGE-XR Take 500 mg by mouth at bedtime.   metoprolol tartrate 50 MG tablet Commonly known as: LOPRESSOR Take 50 mg by mouth 2 (two) times daily.   oxyCODONE 5 MG immediate release tablet Commonly known as: Roxicodone Take 1-2 tablets (5-10 mg total) by mouth every 6 (six) hours as needed for moderate pain.   pantoprazole 40 MG tablet Commonly known as: PROTONIX TAKE 1 TABLET(40 MG) BY MOUTH TWICE DAILY BEFORE A MEAL   rosuvastatin 5 MG tablet Commonly known as: CRESTOR Take 5 mg by mouth at bedtime.   valsartan-hydrochlorothiazide 320-12.5 MG tablet Commonly known as: DIOVAN-HCT Take 1 tablet by mouth daily.         Allergies:  Allergies  Allergen Reactions   Gabapentin Other (See Comments)    syncope   Capoten [Captopril] Cough   Celexa [Citalopram Hydrobromide]     Passed out   Hydrocodone     "awake all night", felt terrible   Lexapro [Escitalopram Oxalate]     "see psychodelic lights"   Lipitor [Atorvastatin Calcium] Other (See Comments)    Achy legs   Tramadol     confusion    Family History: Family History  Problem Relation Age of Onset   Hyperlipidemia Mother    Heart attack Mother    Parkinson's disease Father    Stroke Father 7   Hyperlipidemia Sister    Alcoholism Brother    Diabetes Brother    Heart failure Paternal Grandmother    Colon cancer Neg Hx    Gastric cancer Neg Hx    Esophageal cancer Neg Hx    Liver disease Neg Hx    Pancreatic disease Neg Hx     Social History:  reports that she quit smoking about 37 years ago. Her smoking use included cigarettes. She has a 3.00 pack-year smoking history. She has never used smokeless tobacco. She reports that she does not currently use alcohol. She reports that she does not use drugs.  ROS: All other review of systems were reviewed and are negative except what is noted above in HPI  Physical Exam: BP (!) 140/75   Pulse 62   Constitutional:  Alert and oriented, No acute distress. HEENT: [place] AT, moist mucus membranes.  Trachea midline, no masses. Cardiovascular: No clubbing, cyanosis, or edema. Respiratory: Normal respiratory effort, no increased work of breathing. GI: Abdomen is soft, nontender, nondistended, no abdominal masses GU: No CVA tenderness.  Lymph: No cervical or inguinal lymphadenopathy. Skin: No rashes, bruises or suspicious lesions. Neurologic: Grossly intact, no focal deficits, moving all 4 extremities. Psychiatric: Normal mood and affect.  Laboratory Data: Lab Results  Component Value Date   WBC 11.5 (H) 01/02/2022   HGB 12.0 01/02/2022   HCT 36.9 01/02/2022   MCV 87.9 01/02/2022    PLT 228 01/02/2022    Lab Results  Component Value Date   CREATININE 1.19 (H) 09/02/2022    No results found for: "PSA"  No results found for: "TESTOSTERONE"  Lab Results  Component Value Date   HGBA1C 7.1 (H) 11/26/2021    Urinalysis    Component Value Date/Time   COLORURINE YELLOW 01/02/2022 1548   APPEARANCEUR Clear 06/13/2022 1148   LABSPEC 1.020 01/02/2022 1548   PHURINE 5.0 01/02/2022 1548   GLUCOSEU Negative 06/13/2022 [place] 01/02/2022 1548   BILIRUBINUR Negative 06/13/2022 [place] 01/02/2022 1548   PROTEINUR Negative 06/13/2022 [place] 01/02/2022 1548  NITRITE Negative 06/13/2022 1148   NITRITE NEGATIVE 01/02/2022 1548   LEUKOCYTESUR Trace (A) 06/13/2022 1148   LEUKOCYTESUR LARGE (A) 01/02/2022 1548    Lab Results  Component Value Date   LABMICR See below: 06/13/2022   WBCUA None seen 06/13/2022   LABEPIT 0-10 06/13/2022   BACTERIA None seen 06/13/2022    Pertinent Imaging: CT 09/02/2022: Images reviewed and discussed with the patient  No results found for this or any previous visit.  No results found for this or any previous visit.  No results found for this or any previous visit.  No results found for this or any previous visit.  No results found for this or any previous visit.  No results found for this or any previous visit.  No results found for this or any previous visit.  No results found for this or any previous visit.   Assessment & Plan:    1. Renal cancer, left (HCC) CT 6 months with CMP and CT abd - Urinalysis, Routine w reflex microscopic   No follow-ups on file.  Nicolette Bang, MD  [facility] [place]

## 2022-09-15 ENCOUNTER — Telehealth: Payer: Self-pay

## 2022-09-15 LAB — URINALYSIS, ROUTINE W REFLEX MICROSCOPIC
Bilirubin, UA: NEGATIVE
Nitrite, UA: NEGATIVE
Protein,UA: NEGATIVE
RBC, UA: NEGATIVE
Specific Gravity, UA: 1.015 (ref 1.005–1.030)
Urobilinogen, Ur: 0.2 mg/dL (ref 0.2–1.0)
pH, UA: 5 (ref 5.0–7.5)

## 2022-09-15 LAB — MICROSCOPIC EXAMINATION: Epithelial Cells (non renal): >10 /hpf — AB (ref 0–10)

## 2022-09-15 NOTE — Telephone Encounter (Signed)
Discussed in office on 9/20

## 2022-09-15 NOTE — Telephone Encounter (Signed)
-----   Message from Cleon Gustin, MD sent at 09/13/2022 10:23 AM EDT ----- normal ----- Message ----- From: Iris Pert, LPN Sent: 02/10/2445   8:13 AM EDT To: Cleon Gustin, MD  F/u 9/20

## 2022-09-15 NOTE — Telephone Encounter (Signed)
-----   Message from Cleon Gustin, MD sent at 09/13/2022 10:25 AM EDT ----- Ct shows no evidence of metastatic disease ----- Message ----- From: Iris Pert, LPN Sent: 3/84/5364   8:13 AM EDT To: Cleon Gustin, MD  F/u 9/20

## 2022-09-20 DIAGNOSIS — M47819 Spondylosis without myelopathy or radiculopathy, site unspecified: Secondary | ICD-10-CM | POA: Diagnosis not present

## 2022-09-20 DIAGNOSIS — M6281 Muscle weakness (generalized): Secondary | ICD-10-CM | POA: Diagnosis not present

## 2022-09-20 DIAGNOSIS — Z7409 Other reduced mobility: Secondary | ICD-10-CM | POA: Diagnosis not present

## 2022-09-20 DIAGNOSIS — Z9181 History of falling: Secondary | ICD-10-CM | POA: Diagnosis not present

## 2022-09-20 DIAGNOSIS — R2689 Other abnormalities of gait and mobility: Secondary | ICD-10-CM | POA: Diagnosis not present

## 2022-09-20 DIAGNOSIS — M79604 Pain in right leg: Secondary | ICD-10-CM | POA: Diagnosis not present

## 2022-09-27 DIAGNOSIS — Z9181 History of falling: Secondary | ICD-10-CM | POA: Diagnosis not present

## 2022-09-27 DIAGNOSIS — M79604 Pain in right leg: Secondary | ICD-10-CM | POA: Diagnosis not present

## 2022-09-27 DIAGNOSIS — M6281 Muscle weakness (generalized): Secondary | ICD-10-CM | POA: Diagnosis not present

## 2022-09-27 DIAGNOSIS — Z7409 Other reduced mobility: Secondary | ICD-10-CM | POA: Diagnosis not present

## 2022-09-27 DIAGNOSIS — R2689 Other abnormalities of gait and mobility: Secondary | ICD-10-CM | POA: Diagnosis not present

## 2022-09-27 DIAGNOSIS — M47819 Spondylosis without myelopathy or radiculopathy, site unspecified: Secondary | ICD-10-CM | POA: Diagnosis not present

## 2022-09-28 DIAGNOSIS — E782 Mixed hyperlipidemia: Secondary | ICD-10-CM | POA: Diagnosis not present

## 2022-09-28 DIAGNOSIS — N183 Chronic kidney disease, stage 3 unspecified: Secondary | ICD-10-CM | POA: Diagnosis not present

## 2022-09-28 DIAGNOSIS — E1122 Type 2 diabetes mellitus with diabetic chronic kidney disease: Secondary | ICD-10-CM | POA: Diagnosis not present

## 2022-09-28 DIAGNOSIS — D529 Folate deficiency anemia, unspecified: Secondary | ICD-10-CM | POA: Diagnosis not present

## 2022-09-28 DIAGNOSIS — D649 Anemia, unspecified: Secondary | ICD-10-CM | POA: Diagnosis not present

## 2022-09-28 DIAGNOSIS — E7849 Other hyperlipidemia: Secondary | ICD-10-CM | POA: Diagnosis not present

## 2022-09-28 DIAGNOSIS — D519 Vitamin B12 deficiency anemia, unspecified: Secondary | ICD-10-CM | POA: Diagnosis not present

## 2022-09-28 DIAGNOSIS — E1165 Type 2 diabetes mellitus with hyperglycemia: Secondary | ICD-10-CM | POA: Diagnosis not present

## 2022-10-03 DIAGNOSIS — M8580 Other specified disorders of bone density and structure, unspecified site: Secondary | ICD-10-CM | POA: Diagnosis not present

## 2022-10-03 DIAGNOSIS — G43909 Migraine, unspecified, not intractable, without status migrainosus: Secondary | ICD-10-CM | POA: Diagnosis not present

## 2022-10-03 DIAGNOSIS — E7849 Other hyperlipidemia: Secondary | ICD-10-CM | POA: Diagnosis not present

## 2022-10-03 DIAGNOSIS — E739 Lactose intolerance, unspecified: Secondary | ICD-10-CM | POA: Diagnosis not present

## 2022-10-03 DIAGNOSIS — E1122 Type 2 diabetes mellitus with diabetic chronic kidney disease: Secondary | ICD-10-CM | POA: Diagnosis not present

## 2022-10-03 DIAGNOSIS — Z23 Encounter for immunization: Secondary | ICD-10-CM | POA: Diagnosis not present

## 2022-10-03 DIAGNOSIS — Z6826 Body mass index (BMI) 26.0-26.9, adult: Secondary | ICD-10-CM | POA: Diagnosis not present

## 2022-10-03 DIAGNOSIS — K7581 Nonalcoholic steatohepatitis (NASH): Secondary | ICD-10-CM | POA: Diagnosis not present

## 2022-10-03 DIAGNOSIS — I10 Essential (primary) hypertension: Secondary | ICD-10-CM | POA: Diagnosis not present

## 2022-10-03 DIAGNOSIS — N1832 Chronic kidney disease, stage 3b: Secondary | ICD-10-CM | POA: Diagnosis not present

## 2022-10-06 DIAGNOSIS — M25551 Pain in right hip: Secondary | ICD-10-CM | POA: Diagnosis not present

## 2022-10-06 DIAGNOSIS — M25561 Pain in right knee: Secondary | ICD-10-CM | POA: Diagnosis not present

## 2022-10-06 DIAGNOSIS — M47819 Spondylosis without myelopathy or radiculopathy, site unspecified: Secondary | ICD-10-CM | POA: Diagnosis not present

## 2022-10-06 DIAGNOSIS — R29898 Other symptoms and signs involving the musculoskeletal system: Secondary | ICD-10-CM | POA: Diagnosis not present

## 2022-10-06 DIAGNOSIS — M79651 Pain in right thigh: Secondary | ICD-10-CM | POA: Diagnosis not present

## 2022-10-06 DIAGNOSIS — M79604 Pain in right leg: Secondary | ICD-10-CM | POA: Diagnosis not present

## 2022-10-06 DIAGNOSIS — R2689 Other abnormalities of gait and mobility: Secondary | ICD-10-CM | POA: Diagnosis not present

## 2022-10-06 DIAGNOSIS — Z7409 Other reduced mobility: Secondary | ICD-10-CM | POA: Diagnosis not present

## 2022-10-06 DIAGNOSIS — M2569 Stiffness of other specified joint, not elsewhere classified: Secondary | ICD-10-CM | POA: Diagnosis not present

## 2022-10-06 DIAGNOSIS — M6281 Muscle weakness (generalized): Secondary | ICD-10-CM | POA: Diagnosis not present

## 2022-10-06 DIAGNOSIS — Z9181 History of falling: Secondary | ICD-10-CM | POA: Diagnosis not present

## 2022-10-06 DIAGNOSIS — M25571 Pain in right ankle and joints of right foot: Secondary | ICD-10-CM | POA: Diagnosis not present

## 2022-10-25 DIAGNOSIS — N1832 Chronic kidney disease, stage 3b: Secondary | ICD-10-CM | POA: Diagnosis not present

## 2022-10-25 DIAGNOSIS — I10 Essential (primary) hypertension: Secondary | ICD-10-CM | POA: Diagnosis not present

## 2022-10-25 DIAGNOSIS — E782 Mixed hyperlipidemia: Secondary | ICD-10-CM | POA: Diagnosis not present

## 2022-10-25 DIAGNOSIS — E119 Type 2 diabetes mellitus without complications: Secondary | ICD-10-CM | POA: Diagnosis not present

## 2022-12-08 DIAGNOSIS — M8589 Other specified disorders of bone density and structure, multiple sites: Secondary | ICD-10-CM | POA: Diagnosis not present

## 2022-12-08 DIAGNOSIS — M810 Age-related osteoporosis without current pathological fracture: Secondary | ICD-10-CM | POA: Diagnosis not present

## 2022-12-25 DIAGNOSIS — J069 Acute upper respiratory infection, unspecified: Secondary | ICD-10-CM | POA: Diagnosis not present

## 2022-12-25 DIAGNOSIS — Z1152 Encounter for screening for COVID-19: Secondary | ICD-10-CM | POA: Diagnosis not present

## 2022-12-25 DIAGNOSIS — J209 Acute bronchitis, unspecified: Secondary | ICD-10-CM | POA: Diagnosis not present

## 2022-12-25 DIAGNOSIS — I10 Essential (primary) hypertension: Secondary | ICD-10-CM | POA: Diagnosis not present

## 2022-12-25 DIAGNOSIS — E119 Type 2 diabetes mellitus without complications: Secondary | ICD-10-CM | POA: Diagnosis not present

## 2022-12-25 DIAGNOSIS — E7800 Pure hypercholesterolemia, unspecified: Secondary | ICD-10-CM | POA: Diagnosis not present

## 2022-12-25 DIAGNOSIS — I700 Atherosclerosis of aorta: Secondary | ICD-10-CM | POA: Diagnosis not present

## 2022-12-25 DIAGNOSIS — K219 Gastro-esophageal reflux disease without esophagitis: Secondary | ICD-10-CM | POA: Diagnosis not present

## 2022-12-25 DIAGNOSIS — R051 Acute cough: Secondary | ICD-10-CM | POA: Diagnosis not present

## 2022-12-25 DIAGNOSIS — J9811 Atelectasis: Secondary | ICD-10-CM | POA: Diagnosis not present

## 2022-12-29 DIAGNOSIS — J329 Chronic sinusitis, unspecified: Secondary | ICD-10-CM | POA: Diagnosis not present

## 2022-12-29 DIAGNOSIS — R059 Cough, unspecified: Secondary | ICD-10-CM | POA: Diagnosis not present

## 2022-12-29 DIAGNOSIS — R030 Elevated blood-pressure reading, without diagnosis of hypertension: Secondary | ICD-10-CM | POA: Diagnosis not present

## 2022-12-29 DIAGNOSIS — Z6826 Body mass index (BMI) 26.0-26.9, adult: Secondary | ICD-10-CM | POA: Diagnosis not present

## 2022-12-29 DIAGNOSIS — Z20828 Contact with and (suspected) exposure to other viral communicable diseases: Secondary | ICD-10-CM | POA: Diagnosis not present

## 2023-01-30 DIAGNOSIS — E039 Hypothyroidism, unspecified: Secondary | ICD-10-CM | POA: Diagnosis not present

## 2023-01-30 DIAGNOSIS — I10 Essential (primary) hypertension: Secondary | ICD-10-CM | POA: Diagnosis not present

## 2023-01-30 DIAGNOSIS — E1165 Type 2 diabetes mellitus with hyperglycemia: Secondary | ICD-10-CM | POA: Diagnosis not present

## 2023-01-30 DIAGNOSIS — E1122 Type 2 diabetes mellitus with diabetic chronic kidney disease: Secondary | ICD-10-CM | POA: Diagnosis not present

## 2023-01-30 DIAGNOSIS — E7849 Other hyperlipidemia: Secondary | ICD-10-CM | POA: Diagnosis not present

## 2023-01-30 DIAGNOSIS — N1832 Chronic kidney disease, stage 3b: Secondary | ICD-10-CM | POA: Diagnosis not present

## 2023-01-30 LAB — HEMOGLOBIN A1C: A1c: 7.5

## 2023-01-30 LAB — LAB REPORT - SCANNED: EGFR: 44.5

## 2023-02-03 DIAGNOSIS — M8580 Other specified disorders of bone density and structure, unspecified site: Secondary | ICD-10-CM | POA: Diagnosis not present

## 2023-02-03 DIAGNOSIS — N1832 Chronic kidney disease, stage 3b: Secondary | ICD-10-CM | POA: Diagnosis not present

## 2023-02-03 DIAGNOSIS — I10 Essential (primary) hypertension: Secondary | ICD-10-CM | POA: Diagnosis not present

## 2023-02-03 DIAGNOSIS — E1122 Type 2 diabetes mellitus with diabetic chronic kidney disease: Secondary | ICD-10-CM | POA: Diagnosis not present

## 2023-02-03 DIAGNOSIS — Z6826 Body mass index (BMI) 26.0-26.9, adult: Secondary | ICD-10-CM | POA: Diagnosis not present

## 2023-02-03 DIAGNOSIS — K7581 Nonalcoholic steatohepatitis (NASH): Secondary | ICD-10-CM | POA: Diagnosis not present

## 2023-02-03 DIAGNOSIS — G43909 Migraine, unspecified, not intractable, without status migrainosus: Secondary | ICD-10-CM | POA: Diagnosis not present

## 2023-02-03 DIAGNOSIS — E739 Lactose intolerance, unspecified: Secondary | ICD-10-CM | POA: Diagnosis not present

## 2023-02-03 DIAGNOSIS — E7849 Other hyperlipidemia: Secondary | ICD-10-CM | POA: Diagnosis not present

## 2023-02-07 ENCOUNTER — Other Ambulatory Visit: Payer: Medicare Other

## 2023-03-04 ENCOUNTER — Encounter (HOSPITAL_COMMUNITY): Payer: Self-pay

## 2023-03-04 ENCOUNTER — Emergency Department (HOSPITAL_COMMUNITY)
Admission: EM | Admit: 2023-03-04 | Discharge: 2023-03-04 | Disposition: A | Discharge status: Home / Self Care | Payer: Medicare Other | Attending: Emergency Medicine | Admitting: Emergency Medicine

## 2023-03-04 ENCOUNTER — Emergency Department (HOSPITAL_COMMUNITY): Payer: Medicare Other

## 2023-03-04 ENCOUNTER — Other Ambulatory Visit: Payer: Self-pay

## 2023-03-04 DIAGNOSIS — J069 Acute upper respiratory infection, unspecified: Secondary | ICD-10-CM | POA: Insufficient documentation

## 2023-03-04 DIAGNOSIS — R059 Cough, unspecified: Secondary | ICD-10-CM | POA: Diagnosis not present

## 2023-03-04 DIAGNOSIS — R0981 Nasal congestion: Secondary | ICD-10-CM | POA: Diagnosis present

## 2023-03-04 DIAGNOSIS — Z20822 Contact with and (suspected) exposure to covid-19: Secondary | ICD-10-CM | POA: Insufficient documentation

## 2023-03-04 DIAGNOSIS — J9811 Atelectasis: Secondary | ICD-10-CM | POA: Diagnosis not present

## 2023-03-04 LAB — RESP PANEL BY RT-PCR (RSV, FLU A&B, COVID)  RVPGX2
Influenza A by PCR: NEGATIVE
Influenza B by PCR: NEGATIVE
Resp Syncytial Virus by PCR: NEGATIVE
SARS Coronavirus 2 by RT PCR: NEGATIVE

## 2023-03-04 LAB — GROUP A STREP BY PCR: Group A Strep by PCR: NOT DETECTED

## 2023-03-04 MED ORDER — GUAIFENESIN ER 1200 MG PO TB12: 1.0000 | ORAL_TABLET | Freq: Two times a day (BID) | ORAL | 0 refills | Status: AC

## 2023-03-04 MED ORDER — FLUTICASONE PROPIONATE 50 MCG/ACT NA SUSP: 1.0000 | Freq: Every day | NASAL | 2 refills | Status: DC

## 2023-03-04 NOTE — ED Triage Notes (Signed)
Pt states she began having congestion, chills, low grade fever since last night. She has taken a sinus pill with no relief. Cannot take ibuprofen due to kidney problems, states tylenol does not work for her.

## 2023-03-04 NOTE — ED Provider Notes (Signed)
[facility] Provider Note   CSN: [id number] Arrival date & time: 03/04/23  1754     History  Chief Complaint  Patient presents with   Nasal Congestion    chills    Debra Vasquez is a 77 y.o. female.  HPI   Patient presents to the ER for evaluation of congestion, low-grade fevers and chills.  Patient states she is having generalized malaise.  She is aching all over.  Patient states Tylenol does not work she cannot take ibuprofen because of her kidneys.  She is not having any shortness of breath.  No abdominal pain.  No dysuria  Home Medications Prior to Admission medications   Medication Sig Start Date End Date Taking? Authorizing Provider  fluticasone (FLONASE) 50 MCG/ACT nasal spray Place 1 spray into both nostrils daily for 5 days. 03/04/23 03/09/23 Yes Dorie Rank, MD  Guaifenesin 1200 MG TB12 Take 1 tablet (1,200 mg total) by mouth 2 (two) times daily at 10 AM and 5 PM for 7 days. 03/04/23 03/11/23 Yes Dorie Rank, MD  allopurinol (ZYLOPRIM) 300 MG tablet Take 300 mg by mouth in the morning.     [provider]  cloNIDine (CATAPRES) 0.3 MG tablet Take 0.3 mg by mouth 2 (two) times daily.  07/06/19   [provider]  docusate sodium (COLACE) 100 MG capsule Take 1 capsule (100 mg total) by mouth 2 (two) times daily. Patient not taking: Reported on 03/22/2022 12/07/21   Debbrah Alar, PA-C  doxycycline (VIBRAMYCIN) 100 MG capsule Take 1 capsule (100 mg total) by mouth every 12 (twelve) hours. 06/02/22   McKenzie, Candee Furbish, MD  furosemide (LASIX) 20 MG tablet Take 20 mg by mouth daily as needed for edema.     [provider]  glipiZIDE (GLUCOTROL XL) 5 MG 24 hr tablet Take 5 mg by mouth in the morning.     [provider]  Magnesium 400 MG TABS Take 800 mg by mouth daily.    [provider]  metFORMIN (GLUCOPHAGE-XR) 500 MG 24 hr tablet Take 500 mg by mouth at bedtime.  06/25/19   [provider]  metoprolol tartrate (LOPRESSOR) 50 MG tablet Take 50 mg by mouth 2 (two) times daily.    [provider]  pantoprazole (PROTONIX) 40 MG tablet TAKE 1 TABLET(40 MG) BY MOUTH TWICE DAILY BEFORE A MEAL 05/12/21   Aliene Altes S, PA-C  rosuvastatin (CRESTOR) 5 MG tablet Take 5 mg by mouth at bedtime. 07/08/19   [provider]  valsartan-hydrochlorothiazide (DIOVAN-HCT) 320-12.5 MG tablet Take 1 tablet by mouth daily.    [provider]      Allergies    Gabapentin, Capoten [captopril], Celexa [citalopram hydrobromide], Hydrocodone, Lexapro [escitalopram oxalate], Lipitor [atorvastatin calcium], and Tramadol    Review of Systems   Review of Systems  Physical Exam Updated Vital Signs BP (!) 146/93 (BP Location: Right Arm)   Pulse 70   Temp 99.4 F (37.4 C) (Oral)   Resp 18   Ht 1.626 m ('5\' 4"'$ )   Wt 70.3 kg   SpO2 96%   BMI 26.61 kg/m  Physical Exam Vitals and nursing note reviewed.  Constitutional:      General: She is not in acute distress.    Appearance: She is well-developed.  HENT:     Head: Normocephalic and atraumatic.     Right Ear: External ear normal.     Left Ear: External ear normal.  Nose: No rhinorrhea.     Mouth/Throat:     Pharynx: No oropharyngeal exudate or posterior oropharyngeal erythema.  Eyes:     General: No scleral icterus.       Right eye: No discharge.        Left eye: No discharge.     Conjunctiva/sclera: Conjunctivae normal.  Neck:     Trachea: No tracheal deviation.  Cardiovascular:     Rate and Rhythm: Normal rate and regular rhythm.  Pulmonary:     Effort: Pulmonary effort is normal. No respiratory distress.     Breath sounds: Normal breath sounds. No stridor. No wheezing or rales.  Abdominal:     General: Bowel sounds are normal. There is no distension.     Palpations: Abdomen is soft.     Tenderness: There is no abdominal tenderness. There is no guarding or rebound.  Musculoskeletal:         General: No tenderness or deformity.     Cervical back: Neck supple.  Skin:    General: Skin is warm and dry.     Findings: No rash.  Neurological:     General: No focal deficit present.     Mental Status: She is alert.     Cranial Nerves: No cranial nerve deficit, dysarthria or facial asymmetry.     Sensory: No sensory deficit.     Motor: No abnormal muscle tone or seizure activity.     Coordination: Coordination normal.  Psychiatric:        Mood and Affect: Mood normal.     ED Results / Procedures / Treatments   Labs (all labs ordered are listed, but only abnormal results are displayed) Labs Reviewed  RESP PANEL BY RT-PCR (RSV, FLU A&B, COVID)  [id number]  GROUP A STREP BY PCR    EKG None  Radiology DG Chest Port 1 View  Result Date: 03/04/2023 CLINICAL DATA:  Cough and congestion EXAM: PORTABLE CHEST 1 VIEW COMPARISON:  Chest radiograph dated 12/25/2022 FINDINGS: Unchanged elevation of the right hemidiaphragm. Right basilar linear opacities, likely atelectasis. No focal consolidations. No pleural effusion or pneumothorax. The heart size and mediastinal contours are within normal limits. The visualized skeletal structures are unremarkable. Aortic atherosclerosis. IMPRESSION: 1. Unchanged elevation of the right hemidiaphragm with right basilar atelectasis. 2.  No focal consolidations. 3.  Aortic Atherosclerosis (ICD10-I70.0). Electronically Signed   By: Darrin Nipper M.D.   On: 03/04/2023 19:44    Procedures Procedures    Medications Ordered in ED Medications - No data to display  ED Course/ Medical Decision Making/ A&P Clinical Course as of 03/04/23 2009  Sat Mar 04, 2023  1944 Strep test negative [JK]  1949 Resp panel by RT-PCR (RSV, Flu A&B, Covid) Anterior Nasal Swab Covid flu and RSV negative [JK]  1950 Chest x-ray without acute findings [JK]    Clinical Course User Index [JK] Dorie Rank, MD                             Medical Decision Making Problems  Addressed: Upper respiratory tract infection, unspecified type: acute illness or injury that poses a threat to life or bodily functions  Amount and/or Complexity of Data Reviewed Labs: ordered. Decision-making details documented in ED Course. Radiology: ordered and independent interpretation performed.  Risk OTC drugs.   Patient presented to the ED for URI symptoms.  ED workup reassuring.  No evidence of pneumonia.  COVID flu RSV negative.  Strep test negative.  Suspect viral etiology.  Will discharge home with prescription for nasal steroids and guaifenesin.        Final Clinical Impression(s) / ED Diagnoses Final diagnoses:  Upper respiratory tract infection, unspecified type    Rx / DC Orders ED Discharge Orders          Ordered    fluticasone (FLONASE) 50 MCG/ACT nasal spray  Daily        03/04/23 2008    Guaifenesin 1200 MG TB12  2 times daily        03/04/23 2008              Dorie Rank, MD 03/04/23 2009

## 2023-03-04 NOTE — Discharge Instructions (Signed)
Take the medications as prescribed to help with your nasal congestion and cold symptoms.  Follow-up with your doctor if the symptoms have not resolved in the next week

## 2023-03-08 ENCOUNTER — Ambulatory Visit (HOSPITAL_COMMUNITY): Payer: Medicare Other

## 2023-03-08 ENCOUNTER — Other Ambulatory Visit: Payer: Medicare Other

## 2023-03-15 ENCOUNTER — Ambulatory Visit: Payer: Medicare Other | Admitting: Urology

## 2023-03-29 ENCOUNTER — Other Ambulatory Visit: Payer: Medicare Other

## 2023-04-12 ENCOUNTER — Other Ambulatory Visit: Payer: Medicare Other

## 2023-04-12 ENCOUNTER — Telehealth: Payer: Self-pay

## 2023-04-12 DIAGNOSIS — R109 Unspecified abdominal pain: Secondary | ICD-10-CM | POA: Diagnosis not present

## 2023-04-12 DIAGNOSIS — R030 Elevated blood-pressure reading, without diagnosis of hypertension: Secondary | ICD-10-CM | POA: Diagnosis not present

## 2023-04-12 DIAGNOSIS — Z6826 Body mass index (BMI) 26.0-26.9, adult: Secondary | ICD-10-CM | POA: Diagnosis not present

## 2023-04-12 NOTE — Telephone Encounter (Signed)
Patient seen her PCP this week Dr. Garner Nash and he advised he believes patient has a hernia. The patient wanted to know if that would be visible on the CT scan that you ordered so she wouldn't need multiple scans done.    Thank you

## 2023-04-19 ENCOUNTER — Ambulatory Visit: Payer: Medicare Other | Admitting: Urology

## 2023-04-21 DIAGNOSIS — M109 Gout, unspecified: Secondary | ICD-10-CM | POA: Diagnosis not present

## 2023-04-21 DIAGNOSIS — R030 Elevated blood-pressure reading, without diagnosis of hypertension: Secondary | ICD-10-CM | POA: Diagnosis not present

## 2023-04-21 DIAGNOSIS — Z6826 Body mass index (BMI) 26.0-26.9, adult: Secondary | ICD-10-CM | POA: Diagnosis not present

## 2023-04-25 ENCOUNTER — Other Ambulatory Visit: Payer: Self-pay

## 2023-04-25 ENCOUNTER — Other Ambulatory Visit: Payer: Medicare Other

## 2023-04-25 DIAGNOSIS — C642 Malignant neoplasm of left kidney, except renal pelvis: Secondary | ICD-10-CM

## 2023-04-26 LAB — COMPREHENSIVE METABOLIC PANEL
ALT: 30 IU/L (ref 0–32)
AST: 44 IU/L — ABNORMAL HIGH (ref 0–40)
Albumin/Globulin Ratio: 1.8 (ref 1.2–2.2)
Albumin: 4 g/dL (ref 3.8–4.8)
Alkaline Phosphatase: 110 IU/L (ref 44–121)
BUN/Creatinine Ratio: 24 (ref 12–28)
BUN: 36 mg/dL — ABNORMAL HIGH (ref 8–27)
Bilirubin Total: 0.5 mg/dL (ref 0.0–1.2)
CO2: 27 mmol/L (ref 20–29)
Calcium: 9.6 mg/dL (ref 8.7–10.3)
Chloride: 95 mmol/L — ABNORMAL LOW (ref 96–106)
Creatinine, Ser: 1.49 mg/dL — ABNORMAL HIGH (ref 0.57–1.00)
Globulin, Total: 2.2 g/dL (ref 1.5–4.5)
Glucose: 136 mg/dL — ABNORMAL HIGH (ref 70–99)
Potassium: 4.1 mmol/L (ref 3.5–5.2)
Sodium: 140 mmol/L (ref 134–144)
Total Protein: 6.2 g/dL (ref 6.0–8.5)
eGFR: 36 mL/min/{1.73_m2} — ABNORMAL LOW (ref >59)

## 2023-04-27 ENCOUNTER — Ambulatory Visit (HOSPITAL_COMMUNITY)
Admission: RE | Admit: 2023-04-27 | Discharge: 2023-04-27 | Disposition: A | Discharge status: Home / Self Care | Payer: Medicare Other | Source: Ambulatory Visit | Attending: Urology | Admitting: Urology

## 2023-04-27 ENCOUNTER — Other Ambulatory Visit: Payer: Medicare Other

## 2023-04-27 DIAGNOSIS — C642 Malignant neoplasm of left kidney, except renal pelvis: Secondary | ICD-10-CM | POA: Diagnosis not present

## 2023-04-27 DIAGNOSIS — N281 Cyst of kidney, acquired: Secondary | ICD-10-CM | POA: Diagnosis not present

## 2023-04-27 DIAGNOSIS — K7460 Unspecified cirrhosis of liver: Secondary | ICD-10-CM | POA: Diagnosis not present

## 2023-04-27 MED ORDER — IOHEXOL 300 MG/ML  SOLN
80.0000 mL | Freq: Once | INTRAMUSCULAR | Status: AC | PRN
  Administered 2023-04-27: 80 mL via INTRAVENOUS

## 2023-05-05 ENCOUNTER — Ambulatory Visit: Payer: Medicare Other | Admitting: Urology

## 2023-05-11 ENCOUNTER — Ambulatory Visit: Payer: Medicare Other | Admitting: Nutrition

## 2023-05-29 DIAGNOSIS — E1165 Type 2 diabetes mellitus with hyperglycemia: Secondary | ICD-10-CM | POA: Diagnosis not present

## 2023-05-29 DIAGNOSIS — R5383 Other fatigue: Secondary | ICD-10-CM | POA: Diagnosis not present

## 2023-05-29 DIAGNOSIS — E039 Hypothyroidism, unspecified: Secondary | ICD-10-CM | POA: Diagnosis not present

## 2023-05-29 DIAGNOSIS — Z1329 Encounter for screening for other suspected endocrine disorder: Secondary | ICD-10-CM | POA: Diagnosis not present

## 2023-05-29 DIAGNOSIS — K219 Gastro-esophageal reflux disease without esophagitis: Secondary | ICD-10-CM | POA: Diagnosis not present

## 2023-05-29 DIAGNOSIS — E7849 Other hyperlipidemia: Secondary | ICD-10-CM | POA: Diagnosis not present

## 2023-05-29 DIAGNOSIS — E782 Mixed hyperlipidemia: Secondary | ICD-10-CM | POA: Diagnosis not present

## 2023-05-29 DIAGNOSIS — I10 Essential (primary) hypertension: Secondary | ICD-10-CM | POA: Diagnosis not present

## 2023-06-02 DIAGNOSIS — M8580 Other specified disorders of bone density and structure, unspecified site: Secondary | ICD-10-CM | POA: Diagnosis not present

## 2023-06-02 DIAGNOSIS — N1832 Chronic kidney disease, stage 3b: Secondary | ICD-10-CM | POA: Diagnosis not present

## 2023-06-02 DIAGNOSIS — E7849 Other hyperlipidemia: Secondary | ICD-10-CM | POA: Diagnosis not present

## 2023-06-02 DIAGNOSIS — Z1331 Encounter for screening for depression: Secondary | ICD-10-CM | POA: Diagnosis not present

## 2023-06-02 DIAGNOSIS — I10 Essential (primary) hypertension: Secondary | ICD-10-CM | POA: Diagnosis not present

## 2023-06-02 DIAGNOSIS — Z0001 Encounter for general adult medical examination with abnormal findings: Secondary | ICD-10-CM | POA: Diagnosis not present

## 2023-06-02 DIAGNOSIS — E1122 Type 2 diabetes mellitus with diabetic chronic kidney disease: Secondary | ICD-10-CM | POA: Diagnosis not present

## 2023-06-02 DIAGNOSIS — Z6826 Body mass index (BMI) 26.0-26.9, adult: Secondary | ICD-10-CM | POA: Diagnosis not present

## 2023-06-02 DIAGNOSIS — Z1389 Encounter for screening for other disorder: Secondary | ICD-10-CM | POA: Diagnosis not present

## 2023-06-02 DIAGNOSIS — K7581 Nonalcoholic steatohepatitis (NASH): Secondary | ICD-10-CM | POA: Diagnosis not present

## 2023-06-02 DIAGNOSIS — E739 Lactose intolerance, unspecified: Secondary | ICD-10-CM | POA: Diagnosis not present

## 2023-06-02 DIAGNOSIS — G43909 Migraine, unspecified, not intractable, without status migrainosus: Secondary | ICD-10-CM | POA: Diagnosis not present

## 2023-06-06 ENCOUNTER — Other Ambulatory Visit: Payer: Self-pay | Admitting: *Deleted

## 2023-06-06 DIAGNOSIS — Z905 Acquired absence of kidney: Secondary | ICD-10-CM | POA: Insufficient documentation

## 2023-06-06 DIAGNOSIS — E782 Mixed hyperlipidemia: Secondary | ICD-10-CM | POA: Insufficient documentation

## 2023-06-06 DIAGNOSIS — I10 Essential (primary) hypertension: Secondary | ICD-10-CM | POA: Insufficient documentation

## 2023-06-06 DIAGNOSIS — K439 Ventral hernia without obstruction or gangrene: Secondary | ICD-10-CM

## 2023-06-06 NOTE — Progress Notes (Unsigned)
History of Present Illness: Debra Vasquez is a 77 y.o. female who presents today for follow up visit at [facility]. - GU history: 1. Left renal cell carcinoma. Underwent robotic-assisted left partial nephrectomy on 12/07/2021. Pathology T3a.   At last visit with Dr. Ronne Binning on 09/14/2022: - Per note: "Creatinine 1.2. CT abd shows no evidence of metastatic disease. No flank pain. CXR normal." - The plan was: "CT 6 months with CMP and CT abd"  Since last visit: > 03/04/2023: No acute findings on chest x-ray.  > 04/27/2023:  CT abdomen w/wo contrast showed: - Postsurgical changes related to left upper pole partial nephrectomy. - No evidence of recurrent or metastatic disease. - Left renal cysts, including a dominant 4.2 cm exophytic posterior lower pole cyst (series 11/image 91), measuring simple fluid density, benign (Bosniak I). No follow-up is recommended.  > 05/29/2023:  Creatinine 1.21; GFR 46.6.   Today: She reports doing well with no acute concerns. She denies any urinary complaints. She denies flank or abdominal pain.  Fall Screening: Do you usually have a device to assist in your mobility? No    Medications: Current Outpatient Medications  Medication Sig Dispense Refill   allopurinol (ZYLOPRIM) 300 MG tablet Take 300 mg by mouth in the morning.      Cholecalciferol (VITAMIN D3) 50 MCG (2000 UT) TABS Take 50 mcg by mouth daily.     cloNIDine (CATAPRES) 0.3 MG tablet Take 0.3 mg by mouth 2 (two) times daily.      Cyanocobalamin (B-12) 3000 MCG CAPS Take 3,000 mcg by mouth daily.     furosemide (LASIX) 20 MG tablet Take 20 mg by mouth daily as needed for edema.      glipiZIDE (GLUCOTROL XL) 5 MG 24 hr tablet Take 5 mg by mouth in the morning.      Magnesium 400 MG TABS Take 800 mg by mouth daily.     Magnesium Bisglycinate (MAG GLYCINATE PO) Take 500 mg by mouth daily.     metFORMIN (GLUCOPHAGE-XR) 500 MG 24 hr tablet Take 500 mg by mouth at bedtime.       metoprolol tartrate (LOPRESSOR) 50 MG tablet Take 50 mg by mouth 2 (two) times daily.     pantoprazole (PROTONIX) 40 MG tablet TAKE 1 TABLET(40 MG) BY MOUTH TWICE DAILY BEFORE A MEAL (Patient taking differently: As needed) 60 tablet 5   valsartan-hydrochlorothiazide (DIOVAN-HCT) 320-12.5 MG tablet Take 1 tablet by mouth daily.     No current facility-administered medications for this visit.    Allergies: Allergies  Allergen Reactions   Gabapentin Other (See Comments)    syncope   Capoten [Captopril] Cough   Celexa [Citalopram Hydrobromide]     Passed out   Hydrocodone     "awake all night", felt terrible   Lexapro [Escitalopram Oxalate]     "see psychodelic lights"   Lipitor [Atorvastatin Calcium] Other (See Comments)    Achy legs   Tramadol     confusion    Past Medical History:  Diagnosis Date   Arthritis    CHF (congestive heart failure) (HCC)    Diabetes mellitus without complication (HCC)    Dysphagia    Family history of adverse reaction to anesthesia    Mother had problems waking up   GERD (gastroesophageal reflux disease)    Gout    Hypertension    Renal mass, left    Past Surgical History:  Procedure Laterality Date   ABDOMINAL HYSTERECTOMY  CARPAL TUNNEL RELEASE Left    CATARACT EXTRACTION W/PHACO Right 09/13/2019   Procedure: CATARACT EXTRACTION PHACO AND INTRAOCULAR LENS PLACEMENT RIGHT EYE;  Surgeon: Fabio Pierce, MD;  Location: [facility];  Service: Ophthalmology;  Laterality: Right;  right   CATARACT EXTRACTION W/PHACO Left 09/27/2019   Procedure: CATARACT EXTRACTION PHACO AND INTRAOCULAR LENS PLACEMENT LEFT EYE  (CDE: 8.04);  Surgeon: Fabio Pierce, MD;  Location: [facility];  Service: Ophthalmology;  Laterality: Left;   ESOPHAGOGASTRODUODENOSCOPY N/A 11/18/2020   Procedure: ESOPHAGOGASTRODUODENOSCOPY (EGD);  Surgeon: Corbin Ade, MD;  [facility]: [facility];  Service: Endoscopy;  Laterality: N/A;  7:30am   MALONEY DILATION N/A 11/18/2020    Procedure: Elease Hashimoto DILATION;  Surgeon: Corbin Ade, MD;  [facility]: [facility];  Service: Endoscopy;  Laterality: N/A;   ROBOTIC ASSITED PARTIAL NEPHRECTOMY Left 12/07/2021   Procedure: XI ROBOTIC ASSITED PARTIAL NEPHRECTOMY;  Surgeon: Malen Gauze, MD;  Location: [facility];  Service: Urology;  Laterality: Left;   TUBAL LIGATION     WISDOM TOOTH EXTRACTION     Family History  Problem Relation Age of Onset   Hyperlipidemia Mother    Heart attack Mother    Parkinson's disease Father    Stroke Father 33   Hyperlipidemia Sister    Alcoholism Brother    Diabetes Brother    Heart failure Paternal Grandmother    Colon cancer Neg Hx    Gastric cancer Neg Hx    Esophageal cancer Neg Hx    Liver disease Neg Hx    Pancreatic disease Neg Hx    Social History   Socioeconomic History   Marital status: Widowed    Spouse name: Not on file   Number of children: Not on file   Years of education: Not on file   Highest education level: Not on file  Occupational History   Not on file  Tobacco Use   Smoking status: Former    Packs/day: 0.50    Years: 6.00    Additional pack years: 0.00    Total pack years: 3.00    Types: Cigarettes    Quit date: 09/09/1985    Years since quitting: 37.7   Smokeless tobacco: Never  Vaping Use   Vaping Use: Never used  Substance and Sexual Activity   Alcohol use: Not Currently    Comment: no h/o etoh use   Drug use: Never   Sexual activity: Not Currently  Other Topics Concern   Not on file  Social History Narrative   Not on file   Social Determinants of Health   Financial Resource Strain: Not on file  Food Insecurity: Not on file  Transportation Needs: Not on file  Physical Activity: Not on file  Stress: Not on file  Social Connections: Not on file  Intimate Partner Violence: Not on file    Review of Systems Constitutional: Patient denies any unintentional weight loss or change in strength lntegumentary: Patient denies any rashes or  pruritus Eyes: Patient denies dry eyes ENT: Patient denies dry mouth Cardiovascular: Patient denies chest pain or syncope Respiratory: Patient denies shortness of breath Gastrointestinal: Patient denies nausea, vomiting, constipation, or diarrhea. She states she has an abdominal hernia and is getting that repaired by Dr. Lovell Sheehan 07/03/2023. Musculoskeletal: Patient denies muscle cramps or weakness Neurologic: Patient denies convulsions or seizures Psychiatric: Patient denies memory problems Allergic/Immunologic: Patient denies recent allergic reaction(s) Hematologic/Lymphatic: Patient denies bleeding tendencies Endocrine: Patient denies heat/cold intolerance  GU: As per HPI.  OBJECTIVE Vitals:  06/08/23 1421  BP: 131/64  Pulse: 60  Temp: 98.5 F (36.9 C)   There is no height or weight on file to calculate BMI.  Physical Examination  Constitutional: No obvious distress; patient is non-toxic appearing  Cardiovascular: No visible lower extremity edema.  Respiratory: The patient does not have audible wheezing/stridor; respirations do not appear labored  Gastrointestinal: Abdomen non-distended Musculoskeletal: Normal ROM of UEs  Skin: No obvious rashes/open sores  Neurologic: CN 2-12 grossly intact Psychiatric: Answered questions appropriately with normal affect  Hematologic/Lymphatic/Immunologic: No obvious bruises or sites of spontaneous bleeding  UA: negative  ASSESSMENT Renal cell carcinoma, left (HCC) - Plan: Urinalysis, Routine w reflex microscopic, CT ABDOMEN PELVIS W WO CONTRAST, Chest 1 View, Comprehensive metabolic panel  History of partial nephrectomy - left - Plan: Urinalysis, Routine w reflex microscopic, CT ABDOMEN PELVIS W WO CONTRAST, Chest 1 View, Comprehensive metabolic panel  We reviewed her recent results; no acute findings. She is doing well. Will plan for follow up in 6 months with repeat CT abdomen/pelvis w/wo contrast, chest xray, and CMP for cancer  surveillance in accordance with AUA guidelines. Pt verbalized understanding and agreement. All questions were answered.  PLAN Advised the following: 1. Return in about 6 months (around 12/08/2023) for UA & f/u with Evette Georges NP (with CMP, chest xray, & CT prior).  Orders Placed This Encounter  Procedures   CT ABDOMEN PELVIS W WO CONTRAST    Standing Status:   Future    Standing Expiration Date:   06/07/2024    Order Specific Question:   If indicated for the ordered procedure, I authorize the administration of contrast media per Radiology protocol    Answer:   Yes    Order Specific Question:   Does the patient have a contrast media/X-ray dye allergy?    Answer:   No    Order Specific Question:   Preferred imaging location?    Answer:   [facility]    Order Specific Question:   If indicated for the ordered procedure, I authorize the administration of oral contrast media per Radiology protocol    Answer:   Yes   Chest 1 View    Standing Status:   Future    Standing Expiration Date:   06/07/2024    Order Specific Question:   Reason for Exam (SYMPTOM  OR DIAGNOSIS REQUIRED)    Answer:   left RCC surveillance    Order Specific Question:   Preferred imaging location?    Answer:   [facility]   Urinalysis, Routine w reflex microscopic   Comprehensive metabolic panel    Standing Status:   Future    Standing Expiration Date:   06/07/2024    It has been explained that the patient is to follow regularly with their PCP in addition to all other providers involved in their care and to follow instructions provided by these respective offices. Patient advised to contact urology clinic if any urologic-pertaining questions, concerns, new symptoms or problems arise in the interim period.  There are no Patient Instructions on file for this visit.  Electronically signed by:  Donnita Falls, FNP   06/08/23    2:51 PM

## 2023-06-08 ENCOUNTER — Encounter: Payer: Self-pay | Admitting: General Surgery

## 2023-06-08 ENCOUNTER — Encounter: Payer: Self-pay | Admitting: Urology

## 2023-06-08 ENCOUNTER — Ambulatory Visit (INDEPENDENT_AMBULATORY_CARE_PROVIDER_SITE_OTHER): Payer: Medicare Other | Admitting: Urology

## 2023-06-08 ENCOUNTER — Ambulatory Visit (INDEPENDENT_AMBULATORY_CARE_PROVIDER_SITE_OTHER): Payer: Medicare Other | Admitting: General Surgery

## 2023-06-08 VITALS — BP 131/78 | HR 51 | Temp 98.0°F | Resp 16 | Ht 64.0 in | Wt 161.0 lb

## 2023-06-08 VITALS — BP 131/64 | HR 60 | Temp 98.5°F

## 2023-06-08 DIAGNOSIS — Z905 Acquired absence of kidney: Secondary | ICD-10-CM

## 2023-06-08 DIAGNOSIS — C642 Malignant neoplasm of left kidney, except renal pelvis: Secondary | ICD-10-CM | POA: Diagnosis not present

## 2023-06-08 DIAGNOSIS — K432 Incisional hernia without obstruction or gangrene: Secondary | ICD-10-CM

## 2023-06-08 LAB — URINALYSIS, ROUTINE W REFLEX MICROSCOPIC
Bilirubin, UA: NEGATIVE
Glucose, UA: NEGATIVE
Ketones, UA: NEGATIVE
Nitrite, UA: NEGATIVE
Protein,UA: NEGATIVE
RBC, UA: NEGATIVE
Specific Gravity, UA: 1.025 (ref 1.005–1.030)
Urobilinogen, Ur: 0.2 mg/dL (ref 0.2–1.0)
pH, UA: 6 (ref 5.0–7.5)

## 2023-06-08 LAB — MICROSCOPIC EXAMINATION: Bacteria, UA: NONE SEEN

## 2023-06-08 NOTE — H&P (Signed)
Debra Vasquez; [id number]; 07/28/1946   HPI Patient is a 77-year-old white female who was referred to my care by Dr. Terry Daniel for evaluation and treatment of a ventral hernia.  Patient is status post robotic assisted laparoscopic left nephrectomy in 2022 who now presents with a supraumbilical hernia.  It is at a surgical trocar site.  She states is made worse with straining.  It does swell when abdominal wall pressure occurs.  She denies any vomiting.  It has been occurring over the past few months and is worsening. Past Medical History:  Diagnosis Date   Arthritis    CHF (congestive heart failure) (HCC)    Diabetes mellitus without complication (HCC)    Dysphagia    Family history of adverse reaction to anesthesia    Mother had problems waking up   GERD (gastroesophageal reflux disease)    Gout    Hypertension    Renal mass, left     Past Surgical History:  Procedure Laterality Date   ABDOMINAL HYSTERECTOMY     CARPAL TUNNEL RELEASE Left    CATARACT EXTRACTION W/PHACO Right 09/13/2019   Procedure: CATARACT EXTRACTION PHACO AND INTRAOCULAR LENS PLACEMENT RIGHT EYE;  Surgeon: Wrzosek, James, MD;  Location: [facility];  Service: Ophthalmology;  Laterality: Right;  right   CATARACT EXTRACTION W/PHACO Left 09/27/2019   Procedure: CATARACT EXTRACTION PHACO AND INTRAOCULAR LENS PLACEMENT LEFT EYE  (CDE: 8.04);  Surgeon: Wrzosek, James, MD;  Location: [facility];  Service: Ophthalmology;  Laterality: Left;   ESOPHAGOGASTRODUODENOSCOPY N/A 11/18/2020   Procedure: ESOPHAGOGASTRODUODENOSCOPY (EGD);  Surgeon: Rourk, Robert M, MD;  [facility];  Service: Endoscopy;  Laterality: N/A;  7:30am   MALONEY DILATION N/A 11/18/2020   Procedure: MALONEY DILATION;  Surgeon: Rourk, Robert M, MD;  [facility];  Service: Endoscopy;  Laterality: N/A;   ROBOTIC ASSITED PARTIAL NEPHRECTOMY Left 12/07/2021   Procedure: XI ROBOTIC ASSITED PARTIAL NEPHRECTOMY;  Surgeon: McKenzie, Patrick L,  MD;  Location: [facility];  Service: Urology;  Laterality: Left;   TUBAL LIGATION     WISDOM TOOTH EXTRACTION      Family History  Problem Relation Age of Onset   Hyperlipidemia Mother    Heart attack Mother    Parkinson's disease Father    Stroke Father 51   Hyperlipidemia Sister    Alcoholism Brother    Diabetes Brother    Heart failure Paternal Grandmother    Colon cancer Neg Hx    Gastric cancer Neg Hx    Esophageal cancer Neg Hx    Liver disease Neg Hx    Pancreatic disease Neg Hx     Current Outpatient Medications on File Prior to Visit  Medication Sig Dispense Refill   allopurinol (ZYLOPRIM) 300 MG tablet Take 300 mg by mouth in the morning.      cloNIDine (CATAPRES) 0.3 MG tablet Take 0.3 mg by mouth 2 (two) times daily.      furosemide (LASIX) 20 MG tablet Take 20 mg by mouth daily as needed for edema.      glipiZIDE (GLUCOTROL XL) 5 MG 24 hr tablet Take 5 mg by mouth in the morning.      Magnesium 400 MG TABS Take 800 mg by mouth daily.     metFORMIN (GLUCOPHAGE-XR) 500 MG 24 hr tablet Take 500 mg by mouth at bedtime.      metoprolol tartrate (LOPRESSOR) 50 MG tablet Take 50 mg by mouth 2 (two) times daily.     pantoprazole (  PROTONIX) 40 MG tablet TAKE 1 TABLET(40 MG) BY MOUTH TWICE DAILY BEFORE A MEAL 60 tablet 5   valsartan-hydrochlorothiazide (DIOVAN-HCT) 320-12.5 MG tablet Take 1 tablet by mouth daily.     No current facility-administered medications on file prior to visit.    Allergies  Allergen Reactions   Gabapentin Other (See Comments)    syncope   Capoten [Captopril] Cough   Celexa [Citalopram Hydrobromide]     Passed out   Hydrocodone     "awake all night", felt terrible   Lexapro [Escitalopram Oxalate]     "see psychodelic lights"   Lipitor [Atorvastatin Calcium] Other (See Comments)    Achy legs   Tramadol     confusion    Social History   Substance and Sexual Activity  Alcohol Use Not Currently   Comment: no h/o etoh use    Social  History   Tobacco Use  Smoking Status Former   Packs/day: 0.50   Years: 6.00   Additional pack years: 0.00   Total pack years: 3.00   Types: Cigarettes   Quit date: 09/09/1985   Years since quitting: 37.7  Smokeless Tobacco Never    Review of Systems  Constitutional: Negative.   HENT:  Positive for sinus pain.   Eyes: Negative.   Respiratory: Negative.    Cardiovascular: Negative.   Gastrointestinal:  Positive for abdominal pain, heartburn and nausea.  Genitourinary:  Positive for frequency.  Musculoskeletal:  Positive for joint pain.  Skin: Negative.   Neurological:  Positive for sensory change and headaches.  Endo/Heme/Allergies: Negative.   Psychiatric/Behavioral: Negative.      Objective   Vitals:   06/08/23 1129  BP: 131/78  Pulse: (!) 51  Resp: 16  Temp: 98 F (36.7 C)  SpO2: 94%    Physical Exam Vitals reviewed.  Constitutional:      Appearance: Normal appearance. She is normal weight. She is not ill-appearing.  HENT:     Head: Normocephalic and atraumatic.  Cardiovascular:     Rate and Rhythm: Normal rate and regular rhythm.     Heart sounds: Normal heart sounds. No murmur heard.    No friction rub. No gallop.  Pulmonary:     Effort: Pulmonary effort is normal. No respiratory distress.     Breath sounds: Normal breath sounds. No stridor. No wheezing, rhonchi or rales.  Abdominal:     General: Bowel sounds are normal. There is no distension.     Palpations: Abdomen is soft. There is no mass.     Tenderness: There is no abdominal tenderness. There is no guarding or rebound.     Hernia: A hernia is present.     Comments: A 4 cm supraumbilical reducible hernia is present below a trocar site.  Skin:    General: Skin is warm and dry.  Neurological:     Mental Status: She is alert and oriented to person, place, and time.   Primary care notes reviewed  Assessment  Incisional hernia Plan  Patient is scheduled for robotic assisted laparoscopic  incisional herniorrhaphy with mesh on 07/03/2023.  The risks and benefits of the procedure including bleeding, infection, mesh use, and the possibility of recurrence of the hernia were fully explained to the patient, who gave informed consent. 

## 2023-06-08 NOTE — Progress Notes (Signed)
Debra Vasquez; [id number]; 1946-04-05   HPI Patient is a 77 year old white female who was referred to my care by Dr. Donzetta Sprung for evaluation and treatment of a ventral hernia.  Patient is status post robotic assisted laparoscopic left nephrectomy in 2022 who now presents with a supraumbilical hernia.  It is at a surgical trocar site.  She states is made worse with straining.  It does swell when abdominal wall pressure occurs.  She denies any vomiting.  It has been occurring over the past few months and is worsening. Past Medical History:  Diagnosis Date   Arthritis    CHF (congestive heart failure) (HCC)    Diabetes mellitus without complication (HCC)    Dysphagia    Family history of adverse reaction to anesthesia    Mother had problems waking up   GERD (gastroesophageal reflux disease)    Gout    Hypertension    Renal mass, left     Past Surgical History:  Procedure Laterality Date   ABDOMINAL HYSTERECTOMY     CARPAL TUNNEL RELEASE Left    CATARACT EXTRACTION W/PHACO Right 09/13/2019   Procedure: CATARACT EXTRACTION PHACO AND INTRAOCULAR LENS PLACEMENT RIGHT EYE;  Surgeon: Fabio Pierce, MD;  Location: [facility];  Service: Ophthalmology;  Laterality: Right;  right   CATARACT EXTRACTION W/PHACO Left 09/27/2019   Procedure: CATARACT EXTRACTION PHACO AND INTRAOCULAR LENS PLACEMENT LEFT EYE  (CDE: 8.04);  Surgeon: Fabio Pierce, MD;  Location: [facility];  Service: Ophthalmology;  Laterality: Left;   ESOPHAGOGASTRODUODENOSCOPY N/A 11/18/2020   Procedure: ESOPHAGOGASTRODUODENOSCOPY (EGD);  Surgeon: Corbin Ade, MD;  [facility];  Service: Endoscopy;  Laterality: N/A;  7:30am   MALONEY DILATION N/A 11/18/2020   Procedure: Elease Hashimoto DILATION;  Surgeon: Corbin Ade, MD;  [facility];  Service: Endoscopy;  Laterality: N/A;   ROBOTIC ASSITED PARTIAL NEPHRECTOMY Left 12/07/2021   Procedure: XI ROBOTIC ASSITED PARTIAL NEPHRECTOMY;  Surgeon: Malen Gauze,  MD;  Location: [facility];  Service: Urology;  Laterality: Left;   TUBAL LIGATION     WISDOM TOOTH EXTRACTION      Family History  Problem Relation Age of Onset   Hyperlipidemia Mother    Heart attack Mother    Parkinson's disease Father    Stroke Father 70   Hyperlipidemia Sister    Alcoholism Brother    Diabetes Brother    Heart failure Paternal Grandmother    Colon cancer Neg Hx    Gastric cancer Neg Hx    Esophageal cancer Neg Hx    Liver disease Neg Hx    Pancreatic disease Neg Hx     Current Outpatient Medications on File Prior to Visit  Medication Sig Dispense Refill   allopurinol (ZYLOPRIM) 300 MG tablet Take 300 mg by mouth in the morning.      cloNIDine (CATAPRES) 0.3 MG tablet Take 0.3 mg by mouth 2 (two) times daily.      furosemide (LASIX) 20 MG tablet Take 20 mg by mouth daily as needed for edema.      glipiZIDE (GLUCOTROL XL) 5 MG 24 hr tablet Take 5 mg by mouth in the morning.      Magnesium 400 MG TABS Take 800 mg by mouth daily.     metFORMIN (GLUCOPHAGE-XR) 500 MG 24 hr tablet Take 500 mg by mouth at bedtime.      metoprolol tartrate (LOPRESSOR) 50 MG tablet Take 50 mg by mouth 2 (two) times daily.     pantoprazole (  PROTONIX) 40 MG tablet TAKE 1 TABLET(40 MG) BY MOUTH TWICE DAILY BEFORE A MEAL 60 tablet 5   valsartan-hydrochlorothiazide (DIOVAN-HCT) 320-12.5 MG tablet Take 1 tablet by mouth daily.     No current facility-administered medications on file prior to visit.    Allergies  Allergen Reactions   Gabapentin Other (See Comments)    syncope   Capoten [Captopril] Cough   Celexa [Citalopram Hydrobromide]     Passed out   Hydrocodone     "awake all night", felt terrible   Lexapro [Escitalopram Oxalate]     "see psychodelic lights"   Lipitor [Atorvastatin Calcium] Other (See Comments)    Achy legs   Tramadol     confusion    Social History   Substance and Sexual Activity  Alcohol Use Not Currently   Comment: no h/o etoh use    Social  History   Tobacco Use  Smoking Status Former   Packs/day: 0.50   Years: 6.00   Additional pack years: 0.00   Total pack years: 3.00   Types: Cigarettes   Quit date: 09/09/1985   Years since quitting: 37.7  Smokeless Tobacco Never    Review of Systems  Constitutional: Negative.   HENT:  Positive for sinus pain.   Eyes: Negative.   Respiratory: Negative.    Cardiovascular: Negative.   Gastrointestinal:  Positive for abdominal pain, heartburn and nausea.  Genitourinary:  Positive for frequency.  Musculoskeletal:  Positive for joint pain.  Skin: Negative.   Neurological:  Positive for sensory change and headaches.  Endo/Heme/Allergies: Negative.   Psychiatric/Behavioral: Negative.      Objective   Vitals:   06/08/23 1129  BP: 131/78  Pulse: (!) 51  Resp: 16  Temp: 98 F (36.7 C)  SpO2: 94%    Physical Exam Vitals reviewed.  Constitutional:      Appearance: Normal appearance. She is normal weight. She is not ill-appearing.  HENT:     Head: Normocephalic and atraumatic.  Cardiovascular:     Rate and Rhythm: Normal rate and regular rhythm.     Heart sounds: Normal heart sounds. No murmur heard.    No friction rub. No gallop.  Pulmonary:     Effort: Pulmonary effort is normal. No respiratory distress.     Breath sounds: Normal breath sounds. No stridor. No wheezing, rhonchi or rales.  Abdominal:     General: Bowel sounds are normal. There is no distension.     Palpations: Abdomen is soft. There is no mass.     Tenderness: There is no abdominal tenderness. There is no guarding or rebound.     Hernia: A hernia is present.     Comments: A 4 cm supraumbilical reducible hernia is present below a trocar site.  Skin:    General: Skin is warm and dry.  Neurological:     Mental Status: She is alert and oriented to person, place, and time.   Primary care notes reviewed  Assessment  Incisional hernia Plan  Patient is scheduled for robotic assisted laparoscopic  incisional herniorrhaphy with mesh on 07/03/2023.  The risks and benefits of the procedure including bleeding, infection, mesh use, and the possibility of recurrence of the hernia were fully explained to the patient, who gave informed consent.

## 2023-06-21 IMAGING — MR MR ABDOMEN WO/W CM
40 of 41 series · 47 of 48 positions shown · IV contrast (gadavist)
Comparison: Abdominal ultrasound, 09/23/2021
COMPARISON: Abdominal ultrasound, 09/23/2021

Addendum:
CLINICAL DATA: Abnormal ultrasound, possible left renal mass

EXAM:
MRI ABDOMEN WITHOUT AND WITH CONTRAST
TECHNIQUE: Multiplanar multisequence MR imaging of the abdomen was performed
both before and after the administration of intravenous contrast.
CONTRAST:  7mL GADAVIST GADOBUTROL 1 MMOL/ML IV SOLN

[Series 3: cor haste · coronal · 6.0mm · 1.25mm/px · 2 of 30 slices shown (1 of 2)]
[im 1/30]
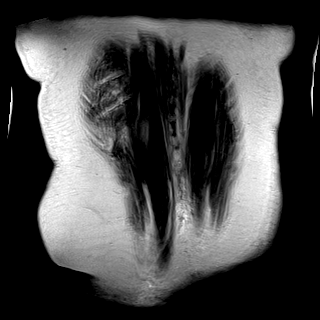
[im 30/30]
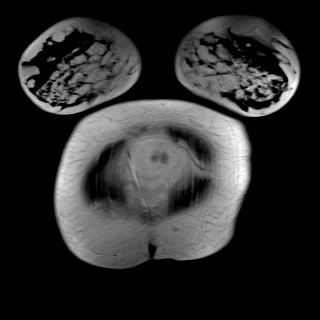

[Series 4: ax haste · axial · 6.0mm · 1.19mm/px · z∈[+11,+220]mm · 2 of 30 slices shown (1 of 2)]
[im 1/30]
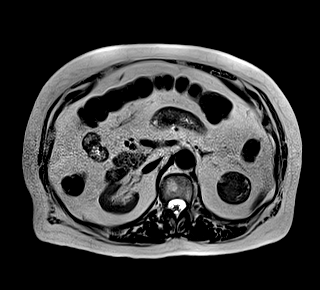
[im 30/30]
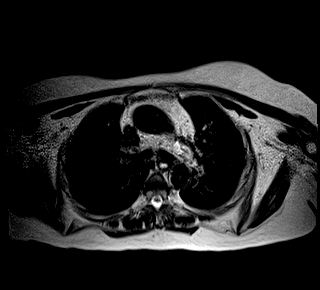

[Series 5: T2 fat-sat · axial · 6.0mm · 1.19mm/px · z∈[+11,+220]mm · 2 of 30 slices shown (1 of 2)]
[im 1/30]
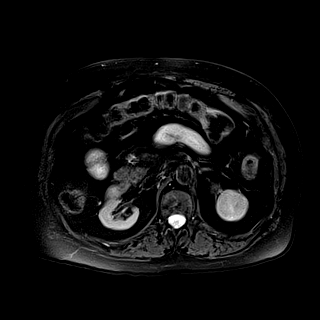
[im 30/30]
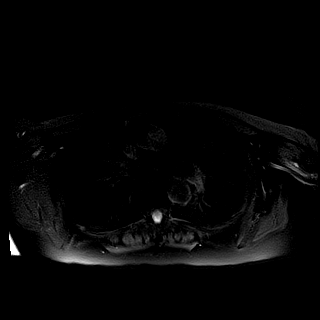

[Series 6: ax in and · axial · 3.0mm · 1.19mm/px · z∈[+9,+222]mm · 2 of 72 slices shown (1 of 4)]
[im 1/72]
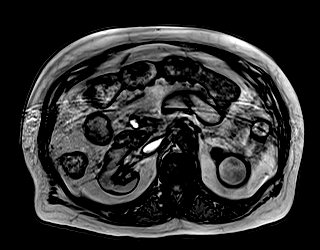
[im 72/72]
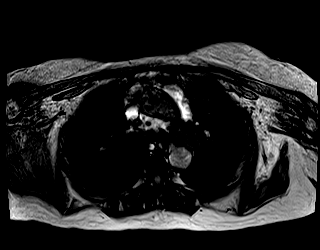

[Series 7: ax in and · axial · 3.0mm · 1.19mm/px · z∈[+9,+222]mm · 2 of 72 slices shown (2 of 4)]
[im 1/72]
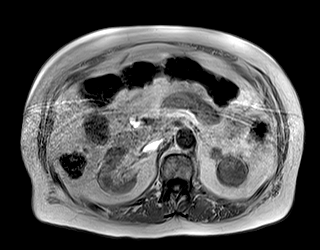
[im 72/72]
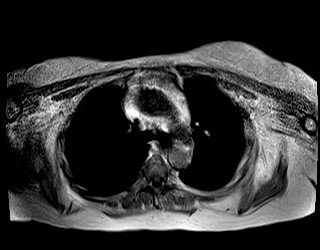

[Series 8: cor in-opp_opp · coronal · 3.0mm · 1.19mm/px · 2 of 72 slices shown]
[im 1/72]
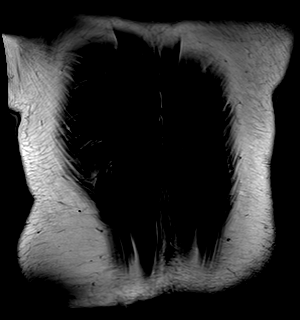
[im 72/72]
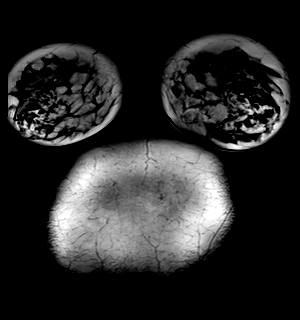

[Series 9: cor in-opp_in · coronal · 3.0mm · 1.19mm/px · 2 of 72 slices shown]
[im 1/72]
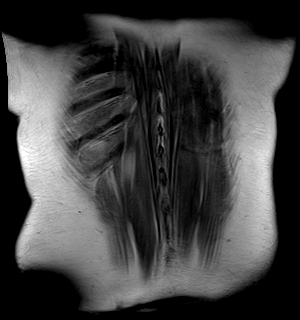
[im 72/72]
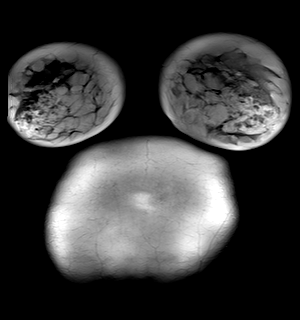

[Series 10: DWI · axial · 6.0mm · 1.42mm/px · 1 of 30 slices shown (1 of 8)]
[im 1/30]
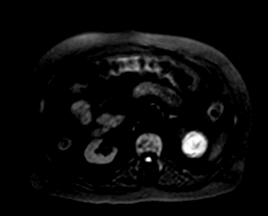

[Series 10: DWI · axial · 6.0mm · 1.42mm/px · 1 of 30 slices shown (2 of 8)]
[im 1/30]
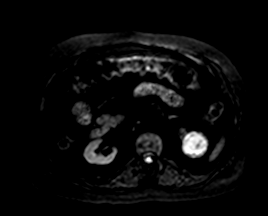

[Series 10: DWI · axial · 6.0mm · 1.42mm/px · 1 of 30 slices shown (3 of 8)]
[im 1/30]
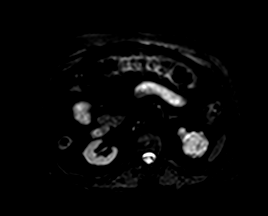

[Series 11: DWI · axial · 6.0mm · 1.42mm/px · 1 of 30 slices shown (4 of 8)]
[im 1/30]
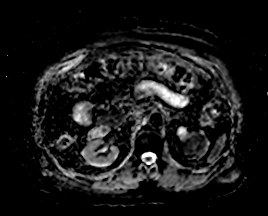

[Series 12: t1_vibe_fs_tra_p4_bh_pre · axial · 3.0mm · 1.19mm/px · 1 of 72 slices shown (1 of 2)]
[im 1/72]
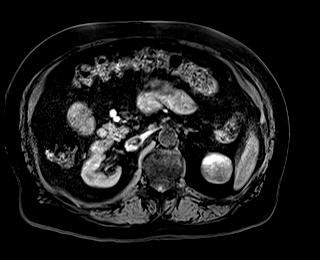

[Series 14: t1_vibe_fs_tra_p4_bh_post · axial · 3.0mm · 1.19mm/px · 1 of 72 slices shown (1 of 8)]
[im 1/72]
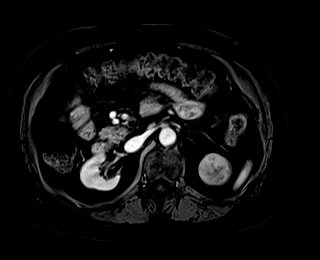

[Series 15: t1_vibe_fs_tra_p4_bh_post_sub · axial · 3.0mm · 1.19mm/px · 1 of 72 slices shown (1 of 8)]
[im 1/72]
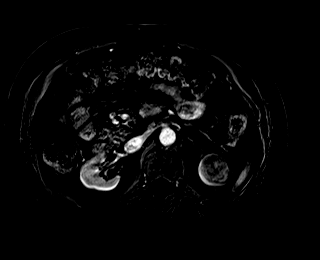

[Series 16: t1_vibe_fs_tra_p4_bh_post · axial · 3.0mm · 1.19mm/px · 1 of 72 slices shown (2 of 8)]
[im 1/72]
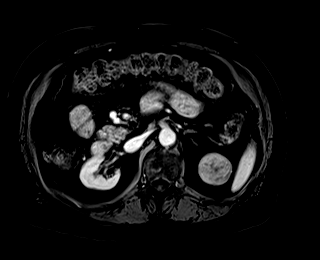

[Series 17: t1_vibe_fs_tra_p4_bh_post_sub · axial · 3.0mm · 1.19mm/px · 1 of 72 slices shown (2 of 8)]
[im 1/72]
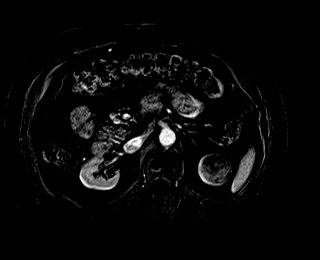

[Series 18: t1_vibe_fs_tra_p4_bh_post · axial · 3.0mm · 1.19mm/px · 1 of 72 slices shown (3 of 8)]
[im 1/72]
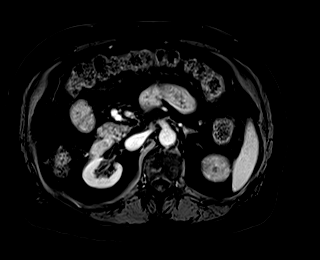

[Series 19: t1_vibe_fs_tra_p4_bh_post_sub · axial · 3.0mm · 1.19mm/px · 1 of 72 slices shown (3 of 8)]
[im 1/72]
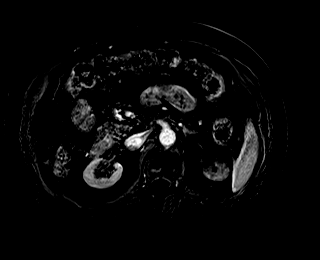

[Series 20: t1_vibe_fs_tra_p4_bh_post · axial · 3.0mm · 1.19mm/px · 1 of 72 slices shown (4 of 8)]
[im 1/72]
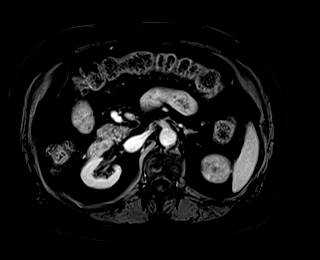

[Series 21: t1_vibe_fs_tra_p4_bh_post_sub · axial · 3.0mm · 1.19mm/px · 1 of 72 slices shown (4 of 8)]
[im 1/72]
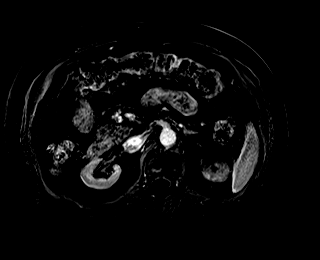

[Series 22: T1 dynamic post-contrast · coronal · 3.0mm · 1.31mm/px · 1 of 72 slices shown]
[im 1/72]
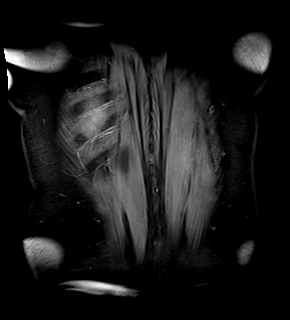

[Series 26: cor haste · coronal · 6.0mm · 1.19mm/px · 1 of 30 slices shown (2 of 2)]
[im 1/30]
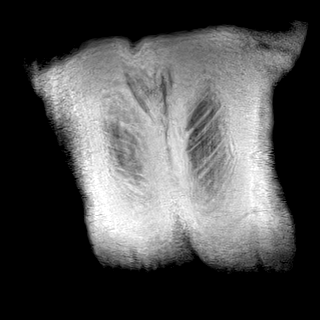

[Series 27: ax haste · axial · 6.0mm · 1.19mm/px · 1 of 30 slices shown (2 of 2)]
[im 1/30]
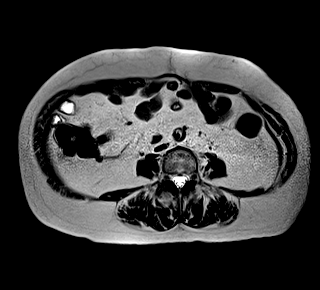

[Series 28: T2 fat-sat · axial · 6.0mm · 1.19mm/px · 1 of 30 slices shown (2 of 2)]
[im 1/30]
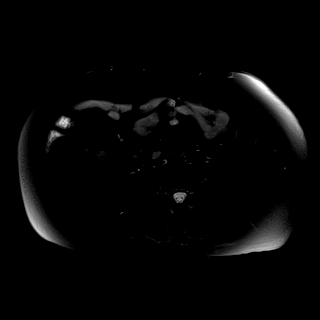

[Series 29: ax in and · axial · 3.5mm · 1.31mm/px · 1 of 72 slices shown (3 of 4)]
[im 1/72]
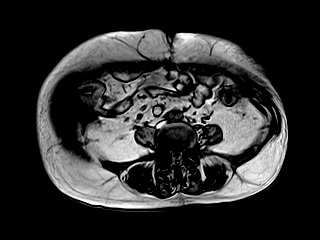

[Series 30: ax in and · axial · 3.5mm · 1.31mm/px · 1 of 72 slices shown (4 of 4)]
[im 1/72]
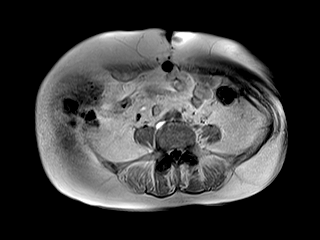

[Series 31: DWI · axial · 6.0mm · 1.42mm/px · 1 of 30 slices shown (5 of 8)]
[im 1/30]
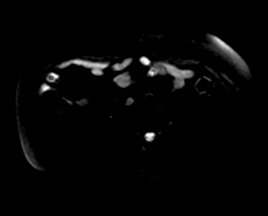

[Series 31: DWI · axial · 6.0mm · 1.42mm/px · 1 of 30 slices shown (6 of 8)]
[im 1/30]
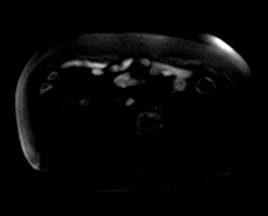

[Series 31: DWI · axial · 6.0mm · 1.42mm/px · 1 of 30 slices shown (7 of 8)]
[im 1/30]
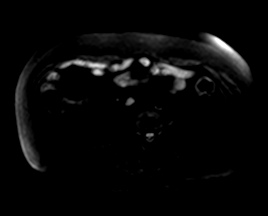

[Series 32: DWI · axial · 6.0mm · 1.42mm/px · 1 of 30 slices shown (8 of 8)]
[im 1/30]
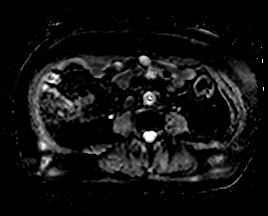

[Series 33: bSSFP · axial · 6.0mm · 0.74mm/px · 1 of 30 slices shown]
[im 1/30]
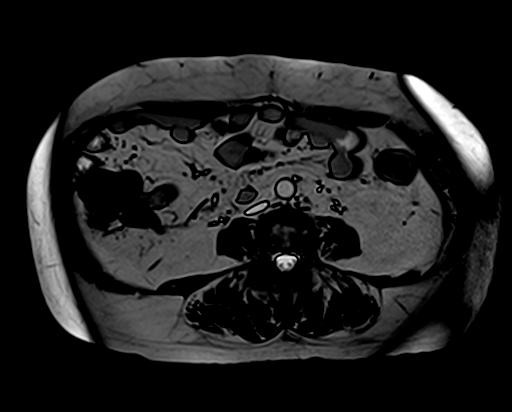

[Series 34: t1_vibe_fs_tra_p4_bh_pre · axial · 3.0mm · 1.34mm/px · 1 of 72 slices shown (2 of 2)]
[im 1/72]
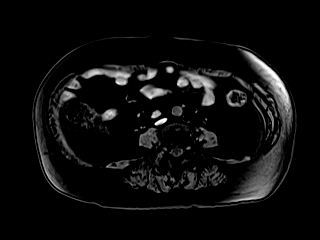

[Series 36: t1_vibe_fs_tra_p4_bh_post · axial · 3.0mm · 1.34mm/px · 1 of 72 slices shown (5 of 8)]
[im 1/72]
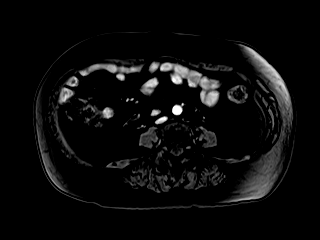

[Series 37: t1_vibe_fs_tra_p4_bh_post_sub · axial · 3.0mm · 1.34mm/px · 1 of 72 slices shown (5 of 8)]
[im 1/72]
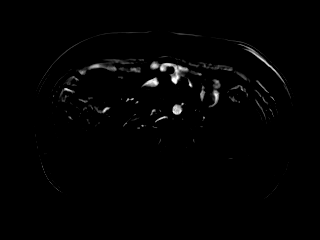

[Series 38: t1_vibe_fs_tra_p4_bh_post · axial · 3.0mm · 1.34mm/px · 1 of 72 slices shown (6 of 8)]
[im 1/72]
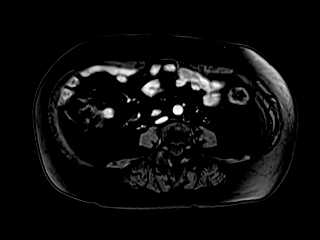

[Series 39: t1_vibe_fs_tra_p4_bh_post_sub · axial · 3.0mm · 1.34mm/px · 1 of 72 slices shown (6 of 8)]
[im 1/72]
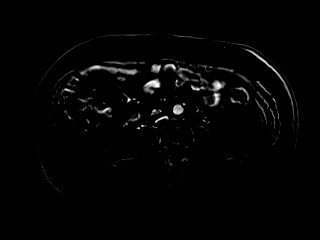

[Series 40: t1_vibe_fs_tra_p4_bh_post · axial · 3.0mm · 1.34mm/px · 1 of 72 slices shown (7 of 8)]
[im 1/72]
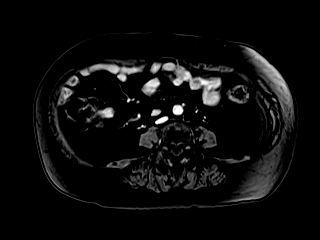

[Series 41: t1_vibe_fs_tra_p4_bh_post_sub · axial · 3.0mm · 1.34mm/px · 1 of 72 slices shown (7 of 8)]
[im 1/72]
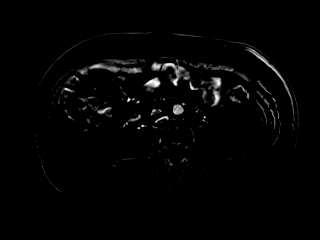

[Series 42: t1_vibe_fs_tra_p4_bh_post · axial · 3.0mm · 1.34mm/px · 1 of 72 slices shown (8 of 8)]
[im 1/72]
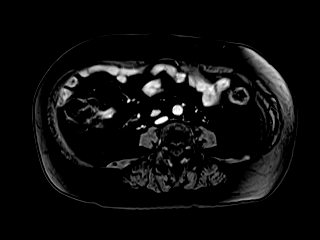

[Series 43: t1_vibe_fs_tra_p4_bh_post_sub · axial · 3.0mm · 1.34mm/px · 1 of 72 slices shown (8 of 8)]
[im 1/72]
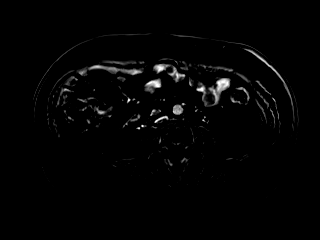

[47 of 48 positions shown; findings below may reference images not displayed]

FINDINGS: Lower chest: No acute findings. There is severe elevation of the
right hemidiaphragm with associated atelectasis or consolidation of
the right lung base.

Hepatobiliary: No mass or other parenchymal abnormality identified.

Pancreas: No mass, inflammatory changes, or other parenchymal
abnormality identified.

Spleen:  Within normal limits in size and appearance.

Adrenals/Urinary Tract: There is a partially exophytic,
heterogeneously contrast enhancing mass of the superior pole of the
left kidney, measuring 3.7 x 3.6 x 3.6 cm (series 22, image 20,
series 5, image 30). Note that due to field of view of the
examination, this mass is incompletely imaged on axial series. No
evidence of hydronephrosis.

Stomach/Bowel: Visualized portions within the abdomen are
unremarkable.

Vascular/Lymphatic: No pathologically enlarged lymph nodes
identified. No abdominal aortic aneurysm demonstrated.

Other:  None.

Musculoskeletal: No suspicious bone lesions identified.
IMPRESSION: 1. There is a partially exophytic, heterogeneously contrast
enhancing mass of the superior pole of the left kidney, measuring
3.7 x 3.6 x 3.6 cm and highly suspicious for renal cell carcinoma.
Note that due to field of view of the examination, this mass is
incompletely imaged on axial series at the inferior extent of the
examination. Additional imaging is required for complete coverage of
this mass, left kidney and adjacent lymph node stations. This
examination will be addended with additional comment following
acquisition additional sequences.
2. No evidence of abdominal metastatic disease. Please note that as
above, evaluation is incomplete due to field of view.
3. Severe elevation of the right hemidiaphragm with associated
atelectasis or consolidation of the right lung base.

ADDENDUM:
Additional imaging was performed to cover the entirety of the left
kidney. There is a large, exophytic simple cyst of the
posteroinferior pole of the left kidney. Otherwise findings are as
originally dictated, with an exophytic, contrast enhancing mass of
the superior pole of the left kidney highly suspicious for renal
cell carcinoma. No evidence of renal vein invasion, lymphadenopathy,
or abdominal metastatic disease.

*** End of Addendum ***
FINDINGS: Lower chest: No acute findings. There is severe elevation of the
right hemidiaphragm with associated atelectasis or consolidation of
the right lung base.

Hepatobiliary: No mass or other parenchymal abnormality identified.

Pancreas: No mass, inflammatory changes, or other parenchymal
abnormality identified.

Spleen:  Within normal limits in size and appearance.

Adrenals/Urinary Tract: There is a partially exophytic,
heterogeneously contrast enhancing mass of the superior pole of the
left kidney, measuring 3.7 x 3.6 x 3.6 cm (series 22, image 20,
series 5, image 30). Note that due to field of view of the
examination, this mass is incompletely imaged on axial series. No
evidence of hydronephrosis.

Stomach/Bowel: Visualized portions within the abdomen are
unremarkable.

Vascular/Lymphatic: No pathologically enlarged lymph nodes
identified. No abdominal aortic aneurysm demonstrated.

Other:  None.

Musculoskeletal: No suspicious bone lesions identified.
IMPRESSION: 1. There is a partially exophytic, heterogeneously contrast
enhancing mass of the superior pole of the left kidney, measuring
3.7 x 3.6 x 3.6 cm and highly suspicious for renal cell carcinoma.
Note that due to field of view of the examination, this mass is
incompletely imaged on axial series at the inferior extent of the
examination. Additional imaging is required for complete coverage of
this mass, left kidney and adjacent lymph node stations. This
examination will be addended with additional comment following
acquisition additional sequences.
2. No evidence of abdominal metastatic disease. Please note that as
above, evaluation is incomplete due to field of view.
3. Severe elevation of the right hemidiaphragm with associated
atelectasis or consolidation of the right lung base.

## 2023-06-27 NOTE — Patient Instructions (Addendum)
Your procedure is scheduled on: 07/03/2023  Report to [facility] Main Entrance at  6:00   AM.  Call this number if you have problems the morning of surgery: [phone]   Remember:   Do not Eat or Drink after midnight         No Smoking the morning of surgery  :  Take these medicines the morning of surgery with A SIP OF WATER: Clonidine, metoprolol, and pantoprazole Allipurinol if needed  No diabetic medication am of surgery   Do not wear jewelry, make-up or nail polish.  Do not wear lotions, powders, or perfumes. You may wear deodorant.  Do not shave 48 hours prior to surgery. Men may shave face and neck.  Do not bring valuables to the hospital.  Contacts, dentures or bridgework may not be worn into surgery.  Leave suitcase in the car. After surgery it may be brought to your room.  For patients admitted to the hospital, checkout time is 11:00 AM the day of discharge.   Patients discharged the day of surgery will not be allowed to drive home.    Special Instructions: Shower using CHG night before surgery and shower the day of surgery use CHG.  Use special wash - you have one bottle of CHG for all showers.  You should use approximately 1/2 of the bottle for each shower. How to Use Chlorhexidine Before Surgery Chlorhexidine gluconate (CHG) is a germ-killing (antiseptic) solution that is used to clean the skin. It can get rid of the bacteria that normally live on the skin and can keep them away for about 24 hours. To clean your skin with CHG, you may be given: A CHG solution to use in the shower or as part of a sponge bath. A prepackaged cloth that contains CHG. Cleaning your skin with CHG may help lower the risk for infection: While you are staying in the intensive care unit of the hospital. If you have a vascular access, such as a central line, to provide short-term or long-term access to your veins. If you have a catheter to drain urine from your bladder. If you are on a  ventilator. A ventilator is a machine that helps you breathe by moving air in and out of your lungs. After surgery. What are the risks? Risks of using CHG include: A skin reaction. Hearing loss, if CHG gets in your ears and you have a perforated eardrum. Eye injury, if CHG gets in your eyes and is not rinsed out. The CHG product catching fire. Make sure that you avoid smoking and flames after applying CHG to your skin. Do not use CHG: If you have a chlorhexidine allergy or have previously reacted to chlorhexidine. On babies younger than 82 months of age. How to use CHG solution Use CHG only as told by your health care provider, and follow the instructions on the label. Use the full amount of CHG as directed. Usually, this is one bottle. During a shower Follow these steps when using CHG solution during a shower (unless your health care provider gives you different instructions): Start the shower. Use your normal soap and shampoo to wash your face and hair. Turn off the shower or move out of the shower stream. Pour the CHG onto a clean washcloth. Do not use any type of brush or rough-edged sponge. Starting at your neck, lather your body down to your toes. Make sure you follow these instructions: If you will be having surgery, pay special attention to  the part of your body where you will be having surgery. Scrub this area for at least 1 minute. Do not use CHG on your head or face. If the solution gets into your ears or eyes, rinse them well with water. Avoid your genital area. Avoid any areas of skin that have broken skin, cuts, or scrapes. Scrub your back and under your arms. Make sure to wash skin folds. Let the lather sit on your skin for 1-2 minutes or as long as told by your health care provider. Thoroughly rinse your entire body in the shower. Make sure that all body creases and crevices are rinsed well. Dry off with a clean towel. Do not put any substances on your body afterward--such  as powder, lotion, or perfume--unless you are told to do so by your health care provider. Only use lotions that are recommended by the manufacturer. Put on clean clothes or pajamas. If it is the night before your surgery, sleep in clean sheets.  During a sponge bath Follow these steps when using CHG solution during a sponge bath (unless your health care provider gives you different instructions): Use your normal soap and shampoo to wash your face and hair. Pour the CHG onto a clean washcloth. Starting at your neck, lather your body down to your toes. Make sure you follow these instructions: If you will be having surgery, pay special attention to the part of your body where you will be having surgery. Scrub this area for at least 1 minute. Do not use CHG on your head or face. If the solution gets into your ears or eyes, rinse them well with water. Avoid your genital area. Avoid any areas of skin that have broken skin, cuts, or scrapes. Scrub your back and under your arms. Make sure to wash skin folds. Let the lather sit on your skin for 1-2 minutes or as long as told by your health care provider. Using a different clean, wet washcloth, thoroughly rinse your entire body. Make sure that all body creases and crevices are rinsed well. Dry off with a clean towel. Do not put any substances on your body afterward--such as powder, lotion, or perfume--unless you are told to do so by your health care provider. Only use lotions that are recommended by the manufacturer. Put on clean clothes or pajamas. If it is the night before your surgery, sleep in clean sheets. How to use CHG prepackaged cloths Only use CHG cloths as told by your health care provider, and follow the instructions on the label. Use the CHG cloth on clean, dry skin. Do not use the CHG cloth on your head or face unless your health care provider tells you to. When washing with the CHG cloth: Avoid your genital area. Avoid any areas of skin  that have broken skin, cuts, or scrapes. Before surgery Follow these steps when using a CHG cloth to clean before surgery (unless your health care provider gives you different instructions): Using the CHG cloth, vigorously scrub the part of your body where you will be having surgery. Scrub using a back-and-forth motion for 3 minutes. The area on your body should be completely wet with CHG when you are done scrubbing. Do not rinse. Discard the cloth and let the area air-dry. Do not put any substances on the area afterward, such as powder, lotion, or perfume. Put on clean clothes or pajamas. If it is the night before your surgery, sleep in clean sheets.  For general bathing Follow these steps  when using CHG cloths for general bathing (unless your health care provider gives you different instructions). Use a separate CHG cloth for each area of your body. Make sure you wash between any folds of skin and between your fingers and toes. Wash your body in the following order, switching to a new cloth after each step: The front of your neck, shoulders, and chest. Both of your arms, under your arms, and your hands. Your stomach and groin area, avoiding the genitals. Your right leg and foot. Your left leg and foot. The back of your neck, your back, and your buttocks. Do not rinse. Discard the cloth and let the area air-dry. Do not put any substances on your body afterward--such as powder, lotion, or perfume--unless you are told to do so by your health care provider. Only use lotions that are recommended by the manufacturer. Put on clean clothes or pajamas. Contact a health care provider if: Your skin gets irritated after scrubbing. You have questions about using your solution or cloth. You swallow any chlorhexidine. Call your local poison control center [phone] [phone] in the [place] Get help right away if: Your eyes itch badly, or they become very red or swollen. Your skin itches badly and is red or  swollen. Your hearing changes. You have trouble seeing. You have swelling or tingling in your mouth or throat. You have trouble breathing. These symptoms may represent a serious problem that is an emergency. Do not wait to see if the symptoms will go away. Get medical help right away. Call your local emergency services (911 in the [place] Do not drive yourself to the hospital. Summary Chlorhexidine gluconate (CHG) is a germ-killing (antiseptic) solution that is used to clean the skin. Cleaning your skin with CHG may help to lower your risk for infection. You may be given CHG to use for bathing. It may be in a bottle or in a prepackaged cloth to use on your skin. Carefully follow your health care provider's instructions and the instructions on the product label. Do not use CHG if you have a chlorhexidine allergy. Contact your health care provider if your skin gets irritated after scrubbing. This information is not intended to replace advice given to you by your health care provider. Make sure you discuss any questions you have with your health care provider. Document Revised: 04/11/2022 Document Reviewed: 02/22/2021 Elsevier Patient Education  2023 [employer] Laparoscopic Ventral Hernia Repair, Care After The following information offers guidance on how to care for yourself after your procedure. Your health care provider may also give you more specific instructions. If you have problems or questions, contact your health care provider. What can I expect after the procedure? After the procedure, it is common to have pain, discomfort, or soreness. Follow these instructions at home: Medicines Take over-the-counter and prescription medicines only as told by your health care provider. Ask your health care provider if the medicine prescribed to you: Requires you to avoid driving or using machinery. Can cause constipation. You may need to take these actions to prevent or treat constipation: Drink  enough fluid to keep your urine pale yellow. Take over-the-counter or prescription medicines. Eat foods that are high in fiber, such as beans, whole grains, and fresh fruits and vegetables. Limit foods that are high in fat and processed sugars, such as fried or sweet foods. Incision care  Follow instructions from your health care provider about how to take care of your incisions. Make sure you: Wash your hands with soap and  water for at least 20 seconds before and after you change your bandage (dressing) or before you touch your abdomen. If soap and water are not available, use hand sanitizer. Change your dressing as told by your health care provider. Leave stitches (sutures), skin glue, or adhesive strips in place. These skin closures may need to stay in place for 2 weeks or longer. If adhesive strip edges start to loosen and curl up, you may trim the loose edges. Do not remove adhesive strips completely unless your health care provider tells you to do that. Check your incision areas every day for signs of infection. Check for: More redness, swelling, or pain. Fluid or blood. Warmth. Pus or a bad smell. Bathing  Do not take baths, swim, or use a hot tub until your health care provider approves. Ask your health care provider if you may take showers. You may only be allowed to take sponge baths. Keep your dressing dry until your health care provider says it can be removed. Activity  Rest as told by your health care provider. Avoid sitting for a long time without moving. Get up to take short walks every 1-2 hours. This is important to improve blood flow and breathing. Ask for help if you feel weak or unsteady. Do not lift anything that is heavier than 10 lb (4.5 kg), or the limit that you are told, until your health care provider says that it is safe. If you were given a sedative during the procedure, it can affect you for several hours. Do not drive or operate machinery until your health care  provider says that it is safe. Return to your normal activities as told by your health care provider. Ask your health care provider what activities are safe for you. General instructions  Hold a pillow over your abdomen when you cough or sneeze. This helps with pain. Do not use any products that contain nicotine or tobacco. These products include cigarettes, chewing tobacco, and vaping devices, such as e-cigarettes. These can delay healing after surgery. If you need help quitting, ask your health care provider. You may be asked to continue to do deep breathing exercises at home. This will help to prevent a lung infection. Keep all follow-up visits. This is important. Contact a health care provider if: You have any of these signs of infection: More redness, swelling, or pain around an incision. Fluid or blood coming from an incision. Warmth coming from an incision. Pus or a bad smell coming from an incision. A fever or chills. You have pain that gets worse or does not get better with medicine. You have nausea or vomiting. You have a cough. You have shortness of breath. You have not had a bowel movement in 3 days. You are not able to urinate. Get help right away if you have: Severe pain in your abdomen. Persistent nausea and vomiting. Redness, warmth, or pain in your leg. Chest pain. Trouble breathing. These symptoms may represent a serious problem that is an emergency. Do not wait to see if the symptoms will go away. Get medical help right away. Call your local emergency services (911 in the [place] Do not drive yourself to the hospital. Summary After this procedure, it is common to have pain, discomfort, or soreness. Follow instructions from your health care provider about how to take care of your incision. Check your incision area every day for signs of infection. Report any signs of infection to your health care provider. Keep all follow-up visits. This  is important. This  information is not intended to replace advice given to you by your health care provider. Make sure you discuss any questions you have with your health care provider. Document Revised: 07/31/2020 Document Reviewed: 07/31/2020 Elsevier Patient Education  2024 Else[employer] Inc. General Anesthesia, Adult, Care After The following information offers guidance on how to care for yourself after your procedure. Your health care provider may also give you more specific instructions. If you have problems or questions, contact your health care provider. What can I expect after the procedure? After the procedure, it is common for people to: Have pain or discomfort at the IV site. Have nausea or vomiting. Have a sore throat or hoarseness. Have trouble concentrating. Feel cold or chills. Feel weak, sleepy, or tired (fatigue). Have soreness and body aches. These can affect parts of the body that were not involved in surgery. Follow these instructions at home: For the time period you were told by your health care provider:  Rest. Do not participate in activities where you could fall or become injured. Do not drive or use machinery. Do not drink alcohol. Do not take sleeping pills or medicines that cause drowsiness. Do not make important decisions or sign legal documents. Do not take care of children on your own. General instructions Drink enough fluid to keep your urine pale yellow. If you have sleep apnea, surgery and certain medicines can increase your risk for breathing problems. Follow instructions from your health care provider about wearing your sleep device: Anytime you are sleeping, including during daytime naps. While taking prescription pain medicines, sleeping medicines, or medicines that make you drowsy. Return to your normal activities as told by your health care provider. Ask your health care provider what activities are safe for you. Take over-the-counter and prescription medicines only as  told by your health care provider. Do not use any products that contain nicotine or tobacco. These products include cigarettes, chewing tobacco, and vaping devices, such as e-cigarettes. These can delay incision healing after surgery. If you need help quitting, ask your health care provider. Contact a health care provider if: You have nausea or vomiting that does not get better with medicine. You vomit every time you eat or drink. You have pain that does not get better with medicine. You cannot urinate or have bloody urine. You develop a skin rash. You have a fever. Get help right away if: You have trouble breathing. You have chest pain. You vomit blood. These symptoms may be an emergency. Get help right away. Call 911. Do not wait to see if the symptoms will go away. Do not drive yourself to the hospital. Summary After the procedure, it is common to have a sore throat, hoarseness, nausea, vomiting, or to feel weak, sleepy, or fatigue. For the time period you were told by your health care provider, do not drive or use machinery. Get help right away if you have difficulty breathing, have chest pain, or vomit blood. These symptoms may be an emergency. This information is not intended to replace advice given to you by your health care provider. Make sure you discuss any questions you have with your health care provider. Document Revised: 03/11/2022 Document Reviewed: 03/11/2022 Elsevier Patient Education  2024 [employer].

## 2023-06-30 ENCOUNTER — Other Ambulatory Visit: Payer: Self-pay

## 2023-06-30 ENCOUNTER — Encounter (HOSPITAL_COMMUNITY): Payer: Self-pay

## 2023-06-30 ENCOUNTER — Encounter (HOSPITAL_COMMUNITY)
Admission: RE | Admit: 2023-06-30 | Discharge: 2023-06-30 | Disposition: A | Discharge status: Home / Self Care | Payer: Medicare Other | Source: Ambulatory Visit | Attending: General Surgery | Admitting: General Surgery

## 2023-06-30 VITALS — BP 123/68 | HR 53 | Temp 98.1°F

## 2023-06-30 DIAGNOSIS — R001 Bradycardia, unspecified: Secondary | ICD-10-CM | POA: Diagnosis not present

## 2023-06-30 DIAGNOSIS — I10 Essential (primary) hypertension: Secondary | ICD-10-CM

## 2023-06-30 DIAGNOSIS — Z01818 Encounter for other preprocedural examination: Secondary | ICD-10-CM | POA: Diagnosis not present

## 2023-07-03 ENCOUNTER — Ambulatory Visit (HOSPITAL_COMMUNITY): Payer: Medicare Other | Admitting: Anesthesiology

## 2023-07-03 ENCOUNTER — Ambulatory Visit (HOSPITAL_COMMUNITY)
Admission: RE | Admit: 2023-07-03 | Discharge: 2023-07-03 | Disposition: A | Discharge status: Home / Self Care | Payer: Medicare Other | Source: Home / Self Care | Attending: General Surgery | Admitting: General Surgery

## 2023-07-03 ENCOUNTER — Other Ambulatory Visit: Payer: Self-pay

## 2023-07-03 ENCOUNTER — Encounter (HOSPITAL_COMMUNITY)
Admission: RE | Disposition: A | Discharge status: Home / Self Care | Payer: Self-pay | Source: Home / Self Care | Attending: General Surgery

## 2023-07-03 ENCOUNTER — Encounter (HOSPITAL_COMMUNITY): Payer: Self-pay | Admitting: General Surgery

## 2023-07-03 ENCOUNTER — Ambulatory Visit (HOSPITAL_BASED_OUTPATIENT_CLINIC_OR_DEPARTMENT_OTHER): Payer: Medicare Other | Admitting: Anesthesiology

## 2023-07-03 DIAGNOSIS — M109 Gout, unspecified: Secondary | ICD-10-CM | POA: Diagnosis not present

## 2023-07-03 DIAGNOSIS — Z85528 Personal history of other malignant neoplasm of kidney: Secondary | ICD-10-CM | POA: Insufficient documentation

## 2023-07-03 DIAGNOSIS — K219 Gastro-esophageal reflux disease without esophagitis: Secondary | ICD-10-CM | POA: Diagnosis not present

## 2023-07-03 DIAGNOSIS — Z79899 Other long term (current) drug therapy: Secondary | ICD-10-CM | POA: Diagnosis not present

## 2023-07-03 DIAGNOSIS — I509 Heart failure, unspecified: Secondary | ICD-10-CM | POA: Insufficient documentation

## 2023-07-03 DIAGNOSIS — K432 Incisional hernia without obstruction or gangrene: Secondary | ICD-10-CM

## 2023-07-03 DIAGNOSIS — Z87891 Personal history of nicotine dependence: Secondary | ICD-10-CM

## 2023-07-03 DIAGNOSIS — E119 Type 2 diabetes mellitus without complications: Secondary | ICD-10-CM | POA: Diagnosis not present

## 2023-07-03 DIAGNOSIS — Z905 Acquired absence of kidney: Secondary | ICD-10-CM | POA: Insufficient documentation

## 2023-07-03 DIAGNOSIS — E1122 Type 2 diabetes mellitus with diabetic chronic kidney disease: Secondary | ICD-10-CM | POA: Diagnosis not present

## 2023-07-03 DIAGNOSIS — Z7984 Long term (current) use of oral hypoglycemic drugs: Secondary | ICD-10-CM | POA: Insufficient documentation

## 2023-07-03 DIAGNOSIS — I110 Hypertensive heart disease with heart failure: Secondary | ICD-10-CM | POA: Insufficient documentation

## 2023-07-03 DIAGNOSIS — N189 Chronic kidney disease, unspecified: Secondary | ICD-10-CM | POA: Diagnosis not present

## 2023-07-03 DIAGNOSIS — I10 Essential (primary) hypertension: Secondary | ICD-10-CM

## 2023-07-03 HISTORY — PX: XI ROBOTIC ASSISTED VENTRAL HERNIA: SHX6789

## 2023-07-03 LAB — GLUCOSE, CAPILLARY
Glucose-Capillary: 138 mg/dL — ABNORMAL HIGH (ref 70–99)
Glucose-Capillary: 160 mg/dL — ABNORMAL HIGH (ref 70–99)

## 2023-07-03 SURGERY — REPAIR, HERNIA, VENTRAL, ROBOT-ASSISTED
Anesthesia: General | Site: Abdomen

## 2023-07-03 MED ORDER — ONDANSETRON HCL 4 MG/2ML IJ SOLN
INTRAMUSCULAR | Status: AC
  Filled 2023-07-03: qty 2

## 2023-07-03 MED ORDER — CEFAZOLIN SODIUM-DEXTROSE 2-4 GM/100ML-% IV SOLN
INTRAVENOUS | Status: AC
  Filled 2023-07-03: qty 100

## 2023-07-03 MED ORDER — PROPOFOL 10 MG/ML IV BOLUS
INTRAVENOUS | Status: AC
  Filled 2023-07-03: qty 20

## 2023-07-03 MED ORDER — DEXAMETHASONE SODIUM PHOSPHATE 10 MG/ML IJ SOLN
INTRAMUSCULAR | Status: DC | PRN
  Administered 2023-07-03: 4 mg via INTRAVENOUS

## 2023-07-03 MED ORDER — FENTANYL CITRATE (PF) 100 MCG/2ML IJ SOLN
INTRAMUSCULAR | Status: AC
  Filled 2023-07-03: qty 2

## 2023-07-03 MED ORDER — FENTANYL CITRATE (PF) 100 MCG/2ML IJ SOLN
INTRAMUSCULAR | Status: DC | PRN
  Administered 2023-07-03: 50 ug via INTRAVENOUS
  Administered 2023-07-03: 100 ug via INTRAVENOUS
  Administered 2023-07-03: 50 ug via INTRAVENOUS

## 2023-07-03 MED ORDER — ORAL CARE MOUTH RINSE: 15.0000 mL | Freq: Once | OROMUCOSAL | Status: AC

## 2023-07-03 MED ORDER — CEFAZOLIN SODIUM-DEXTROSE 2-4 GM/100ML-% IV SOLN
2.0000 g | INTRAVENOUS | Status: AC
  Administered 2023-07-03: 2 g via INTRAVENOUS

## 2023-07-03 MED ORDER — ONDANSETRON HCL 4 MG/2ML IJ SOLN: 4.0000 mg | Freq: Once | INTRAMUSCULAR | Status: DC | PRN

## 2023-07-03 MED ORDER — LIDOCAINE HCL (CARDIAC) PF 100 MG/5ML IV SOSY
PREFILLED_SYRINGE | INTRAVENOUS | Status: DC | PRN
  Administered 2023-07-03: 60 mg via INTRATRACHEAL

## 2023-07-03 MED ORDER — GLYCOPYRROLATE PF 0.2 MG/ML IJ SOSY
PREFILLED_SYRINGE | INTRAMUSCULAR | Status: AC
  Filled 2023-07-03: qty 1

## 2023-07-03 MED ORDER — BUPIVACAINE HCL (PF) 0.5 % IJ SOLN
INTRAMUSCULAR | Status: AC
  Filled 2023-07-03: qty 30

## 2023-07-03 MED ORDER — IPRATROPIUM-ALBUTEROL 0.5-2.5 (3) MG/3ML IN SOLN
RESPIRATORY_TRACT | Status: AC
  Filled 2023-07-03: qty 3

## 2023-07-03 MED ORDER — ROCURONIUM BROMIDE 10 MG/ML (PF) SYRINGE
PREFILLED_SYRINGE | INTRAVENOUS | Status: AC
  Filled 2023-07-03: qty 10

## 2023-07-03 MED ORDER — ACETAMINOPHEN 10 MG/ML IV SOLN
INTRAVENOUS | Status: AC
  Filled 2023-07-03: qty 100

## 2023-07-03 MED ORDER — CHLORHEXIDINE GLUCONATE CLOTH 2 % EX PADS: 6.0000 | MEDICATED_PAD | Freq: Once | CUTANEOUS | Status: DC

## 2023-07-03 MED ORDER — SUGAMMADEX SODIUM 200 MG/2ML IV SOLN
INTRAVENOUS | Status: DC | PRN
  Administered 2023-07-03: 200 mg via INTRAVENOUS

## 2023-07-03 MED ORDER — KETOROLAC TROMETHAMINE 30 MG/ML IJ SOLN
15.0000 mg | Freq: Once | INTRAMUSCULAR | Status: AC
  Administered 2023-07-03: 15 mg via INTRAVENOUS
  Filled 2023-07-03: qty 1

## 2023-07-03 MED ORDER — CHLORHEXIDINE GLUCONATE 0.12 % MT SOLN
15.0000 mL | Freq: Once | OROMUCOSAL | Status: AC
  Administered 2023-07-03: 15 mL via OROMUCOSAL

## 2023-07-03 MED ORDER — LACTATED RINGERS IV SOLN: INTRAVENOUS | Status: DC

## 2023-07-03 MED ORDER — HYDROMORPHONE HCL 1 MG/ML IJ SOLN
INTRAMUSCULAR | Status: AC
  Filled 2023-07-03: qty 1

## 2023-07-03 MED ORDER — IPRATROPIUM-ALBUTEROL 0.5-2.5 (3) MG/3ML IN SOLN
3.0000 mL | Freq: Once | RESPIRATORY_TRACT | Status: AC
  Administered 2023-07-03: 3 mL via RESPIRATORY_TRACT

## 2023-07-03 MED ORDER — HYDROMORPHONE HCL 1 MG/ML IJ SOLN
0.2500 mg | INTRAMUSCULAR | Status: DC | PRN
  Administered 2023-07-03: 0.5 mg via INTRAVENOUS
  Filled 2023-07-03: qty 0.5

## 2023-07-03 MED ORDER — BUPIVACAINE HCL (PF) 0.5 % IJ SOLN
INTRAMUSCULAR | Status: DC | PRN
  Administered 2023-07-03: 30 mL

## 2023-07-03 MED ORDER — HYDROMORPHONE HCL 1 MG/ML IJ SOLN
INTRAMUSCULAR | Status: DC | PRN
  Administered 2023-07-03 (×2): .5 mg via INTRAVENOUS

## 2023-07-03 MED ORDER — STERILE WATER FOR IRRIGATION IR SOLN
Status: DC | PRN
  Administered 2023-07-03: 500 mL

## 2023-07-03 MED ORDER — MEPERIDINE HCL 50 MG/ML IJ SOLN: 6.2500 mg | INTRAMUSCULAR | Status: DC | PRN

## 2023-07-03 MED ORDER — LIDOCAINE HCL (PF) 2 % IJ SOLN
INTRAMUSCULAR | Status: AC
  Filled 2023-07-03: qty 5

## 2023-07-03 MED ORDER — PROPOFOL 10 MG/ML IV BOLUS
INTRAVENOUS | Status: DC | PRN
  Administered 2023-07-03: 130 mg via INTRAVENOUS

## 2023-07-03 MED ORDER — ROCURONIUM BROMIDE 10 MG/ML (PF) SYRINGE
PREFILLED_SYRINGE | INTRAVENOUS | Status: DC | PRN
  Administered 2023-07-03 (×2): 50 mg via INTRAVENOUS
  Administered 2023-07-03: 30 mg via INTRAVENOUS

## 2023-07-03 MED ORDER — OXYCODONE HCL 5 MG PO TABS: 5.0000 mg | ORAL_TABLET | Freq: Four times a day (QID) | ORAL | 0 refills | Status: DC | PRN

## 2023-07-03 MED ORDER — ACETAMINOPHEN 10 MG/ML IV SOLN
1000.0000 mg | Freq: Once | INTRAVENOUS | Status: AC
  Administered 2023-07-03: 1000 mg via INTRAVENOUS

## 2023-07-03 MED ORDER — ONDANSETRON HCL 4 MG/2ML IJ SOLN
INTRAMUSCULAR | Status: DC | PRN
  Administered 2023-07-03: 4 mg via INTRAVENOUS

## 2023-07-03 MED ORDER — DEXAMETHASONE SODIUM PHOSPHATE 10 MG/ML IJ SOLN
INTRAMUSCULAR | Status: AC
  Filled 2023-07-03: qty 1

## 2023-07-03 MED ORDER — LIDOCAINE HCL (CARDIAC) PF 100 MG/5ML IV SOSY: PREFILLED_SYRINGE | INTRAVENOUS | Status: DC | PRN

## 2023-07-03 MED ORDER — GLYCOPYRROLATE PF 0.2 MG/ML IJ SOSY
PREFILLED_SYRINGE | INTRAMUSCULAR | Status: DC | PRN
  Administered 2023-07-03: .2 mg via INTRAVENOUS

## 2023-07-03 SURGICAL SUPPLY — 47 items
ADH SKN CLS APL DERMABOND .7 (GAUZE/BANDAGES/DRESSINGS) ×1
APL PRP STRL LF DISP 70% ISPRP (MISCELLANEOUS) ×1
CHLORAPREP W/TINT 26 (MISCELLANEOUS) ×1 IMPLANT
COVER LIGHT HANDLE STERIS (MISCELLANEOUS) ×2 IMPLANT
COVER MAYO STAND XLG (MISCELLANEOUS) ×1 IMPLANT
COVER TIP SHEARS 8 DVNC (MISCELLANEOUS) ×1 IMPLANT
DERMABOND ADVANCED .7 DNX12 (GAUZE/BANDAGES/DRESSINGS) ×1 IMPLANT
DRAPE ARM DVNC X/XI (DISPOSABLE) ×3 IMPLANT
DRAPE COLUMN DVNC XI (DISPOSABLE) ×1 IMPLANT
DRIVER NDL MEGA SUTCUT DVNCXI (INSTRUMENTS) ×1 IMPLANT
DRIVER NDLE MEGA SUTCUT DVNCXI (INSTRUMENTS) ×1 IMPLANT
ELECT REM PT RETURN 9FT ADLT (ELECTROSURGICAL) ×1
ELECTRODE REM PT RTRN 9FT ADLT (ELECTROSURGICAL) ×1 IMPLANT
FORCEPS BPLR R/ABLATION 8 DVNC (INSTRUMENTS) ×1 IMPLANT
GLOVE BIO SURGEON STRL SZ7 (GLOVE) IMPLANT
GLOVE BIOGEL PI IND STRL 6.5 (GLOVE) IMPLANT
GLOVE BIOGEL PI IND STRL 7.0 (GLOVE) ×4 IMPLANT
GLOVE SS BIOGEL STRL SZ 6.5 (GLOVE) IMPLANT
GLOVE SURG SS PI 7.5 STRL IVOR (GLOVE) ×2 IMPLANT
GOWN STRL REUS W/ TWL LRG LVL3 (GOWN DISPOSABLE) IMPLANT
GOWN STRL REUS W/TWL LRG LVL3 (GOWN DISPOSABLE) ×4 IMPLANT
KIT TURNOVER KIT A (KITS) ×1 IMPLANT
MESH VENTRALIGHT ST 4.5IN (Mesh General) IMPLANT
MESH VENTRALIGHT ST 4X6IN (Mesh General) IMPLANT
NDL HYPO 21X1.5 SAFETY (NEEDLE) ×1 IMPLANT
NDL INSUFFLATION 14GA 120MM (NEEDLE) ×1 IMPLANT
NEEDLE HYPO 21X1.5 SAFETY (NEEDLE) ×1 IMPLANT
NEEDLE INSUFFLATION 14GA 120MM (NEEDLE) ×1 IMPLANT
OBTURATOR OPTICAL STND 8 DVNC (TROCAR) ×1
OBTURATOR OPTICALSTD 8 DVNC (TROCAR) ×1 IMPLANT
PACK LAP CHOLE LZT030E (CUSTOM PROCEDURE TRAY) ×1 IMPLANT
PAD ARMBOARD 7.5X6 YLW CONV (MISCELLANEOUS) ×1 IMPLANT
PENCIL HANDSWITCHING (ELECTRODE) ×1 IMPLANT
POSITIONER HEAD 8X9X4 ADT (SOFTGOODS) ×1 IMPLANT
SCISSORS MNPLR CVD DVNC XI (INSTRUMENTS) ×1 IMPLANT
SEAL CANN UNIV 5-8 DVNC XI (MISCELLANEOUS) ×3 IMPLANT
SET BASIN LINEN APH (SET/KITS/TRAYS/PACK) ×1 IMPLANT
SET TUBE SMOKE EVAC HIGH FLOW (TUBING) ×1 IMPLANT
SUT MNCRL AB 4-0 PS2 18 (SUTURE) ×2 IMPLANT
SUT STRATAFIX 0 PDS+ CT-2 23 (SUTURE) ×1
SUT V-LOC 90 ABS 3-0 VLT V-20 (SUTURE) ×2 IMPLANT
SUTURE STRATFX 0 PDS+ CT-2 23 (SUTURE) ×1 IMPLANT
SYR 30ML LL (SYRINGE) ×1 IMPLANT
TAPE TRANSPORE STRL 2 31045 (GAUZE/BANDAGES/DRESSINGS) ×2 IMPLANT
TRAY FOLEY W/BAG SLVR 16FR (SET/KITS/TRAYS/PACK) ×1
TRAY FOLEY W/BAG SLVR 16FR ST (SET/KITS/TRAYS/PACK) ×1 IMPLANT
WATER STERILE IRR 500ML POUR (IV SOLUTION) ×1 IMPLANT

## 2023-07-03 NOTE — Anesthesia Procedure Notes (Addendum)
Procedure Name: Intubation Date/Time: 07/03/2023 7:46 AM  Performed by: Oletha Cruel, CRNAPre-anesthesia Checklist: Patient identified, Emergency Drugs available, Suction available, Patient being monitored and Timeout performed Patient Re-evaluated:Patient Re-evaluated prior to induction Oxygen Delivery Method: Circle system utilized Preoxygenation: Pre-oxygenation with 100% oxygen Induction Type: IV induction Ventilation: Mask ventilation without difficulty Laryngoscope Size: Mac and 4 Grade View: Grade III Tube size: 6.5 mm Number of attempts: 1 Airway Equipment and Method: Stylet Placement Confirmation: ETT inserted through vocal cords under direct vision, positive ETCO2 and breath sounds checked- equal and bilateral Secured at: 21 cm Tube secured with: Tape Dental Injury: Teeth and Oropharynx as per pre-operative assessment  Comments: Bleeding noted at mid-upper gum region. Pressure held to site with gauze.

## 2023-07-03 NOTE — Transportation (Signed)
Departure time corrected.

## 2023-07-03 NOTE — Transfer of Care (Signed)
Immediate Anesthesia Transfer of Care Note  Patient: [place] J Nodal  Procedure(s) Performed: XI ROBOTIC ASSISTED VENTRAL HERNIA (Abdomen)  Patient Location: PACU  Anesthesia Type:General  Level of Consciousness: awake and patient cooperative  Airway & Oxygen Therapy: Patient Spontanous Breathing and Patient connected to face mask oxygen  Post-op Assessment: Report given to RN and Post -op Vital signs reviewed and stable  Post vital signs: Reviewed and stable  Last Vitals:  Vitals Value Taken Time  BP    Temp 36.7 C 07/03/23 0922  Pulse 76 07/03/23 0924  Resp 19 07/03/23 0924  SpO2 100 % 07/03/23 0924  Vitals shown include unvalidated device data.  Last Pain:  Vitals:   07/03/23 0650  TempSrc: Oral  PainSc: 0-No pain         Complications: No notable events documented.

## 2023-07-03 NOTE — Op Note (Signed)
Patient:  Debra Vasquez  DOB:  1946-11-15  MRN:  [id number]   Preop Diagnosis: Incisional hernia  Postop Diagnosis: Same  Procedure: Robotic assisted laparoscopic incisional herniorrhaphy with mesh  Surgeon: Franky Macho, MD  Anes: General endotracheal  Indications: Patient is a 77 year old white female status post laparoscopic surgery in the past who now presents with a ventral hernia at a trocar site.  The risks and benefits of the procedure including bleeding, infection, mesh use, the possibility of recurrence of the hernia were fully explained to the patient, who gave informed consent.  Procedure note: The patient was placed in supine position.  After induction of general endotracheal anesthesia, the abdomen was prepped and draped using usual sterile technique with ChloraPrep.  Surgical site confirmation was performed.  An incision was made in the left upper quadrant at Palmer's point.  The Veress needle was introduced into the abdominal cavity and the abdomen was insufflated to 15 mmHg pressure.  An 8 mm trocar was placed into the left upper quadrant under direct visualization without difficulty.  Additional 8 mm trocars were placed in the left lateral mid abdomen and left lower quadrant regions.  The robot was then targeted and docked.  The patient had omentum into the incisional hernia that was just at the supraumbilical region.  There was an additional midline fat-containing hernia just superior to this and a fat-containing hernia just below the umbilicus.  In total, they measured approximately 11 cm in greatest length.  It was elected to proceed with repairing all 3 hernias.  A 2-0 Stratafix suture was used to reapproximate the fascia of all 3 hernia defects.  An 11 x 15 cm Bard Ventralight dual mesh was then placed and secured circumferentially using a 3-0 V-Loc running suture.  The robot was then undocked and all air was evacuated from the abdominal cavity prior to removal of the  trocars.  All wounds were irrigated normal saline.  The wounds were injected with point 5% Sensorcaine.  All incisions were closed using a 4-0 Monocryl subcuticular suture.  Dermabond was applied.  All tape and needle counts were correct at the end of the procedure.  The patient was extubated in the operating room and transferred to PACU in stable condition.  Complications: None  EBL: Minimal  Specimen: None

## 2023-07-03 NOTE — Anesthesia Postprocedure Evaluation (Signed)
Anesthesia Post Note  Patient: Debra Vasquez  Procedure(s) Performed: XI ROBOTIC ASSISTED VENTRAL HERNIA (Abdomen)  Patient location during evaluation: Phase II Anesthesia Type: General Level of consciousness: awake and alert and oriented Pain management: pain level controlled Vital Signs Assessment: post-procedure vital signs reviewed and stable Respiratory status: spontaneous breathing, nonlabored ventilation and respiratory function stable Cardiovascular status: blood pressure returned to baseline and stable Postop Assessment: no apparent nausea or vomiting Anesthetic complications: no  No notable events documented.   Last Vitals:  Vitals:   07/03/23 1144 07/03/23 1148  BP:  (!) 179/101  Pulse: 76 78  Resp:    Temp:  36.4 C  SpO2: 98% 95%    Last Pain:  Vitals:   07/03/23 1148  TempSrc: Oral  PainSc: 0-No pain                 Billijo Dilling C Nathan Moctezuma

## 2023-07-03 NOTE — Interval H&P Note (Signed)
History and Physical Interval Note:  07/03/2023 7:09 AM  Debra Vasquez  has presented today for surgery, with the diagnosis of INCISIONAL HERNIA, 3- 10 CM.  The various methods of treatment have been discussed with the patient and family. After consideration of risks, benefits and other options for treatment, the patient has consented to  Procedure(s): XI ROBOTIC ASSISTED VENTRAL HERNIA (N/A) as a surgical intervention.  The patient's history has been reviewed, patient examined, no change in status, stable for surgery.  I have reviewed the patient's chart and labs.  Questions were answered to the patient's satisfaction.     Franky Macho

## 2023-07-03 NOTE — Anesthesia Preprocedure Evaluation (Addendum)
Anesthesia Evaluation  Patient identified by MRN, date of birth, ID band Patient awake    Reviewed: Allergy & Precautions, H&P , NPO status , Patient's Chart, lab work & pertinent test results  History of Anesthesia Complications (+) PROLONGED EMERGENCE, Family history of anesthesia reaction and history of anesthetic complications  Airway Mallampati: III  TM Distance: >3 FB Neck ROM: Full    Dental  (+) Missing, Dental Advisory Given, Edentulous Lower, Partial Lower   Pulmonary former smoker   Pulmonary exam normal breath sounds clear to auscultation       Cardiovascular hypertension, Pt. on medications +CHF  Normal cardiovascular exam Rhythm:Regular Rate:Normal     Neuro/Psych negative neurological ROS  negative psych ROS   GI/Hepatic Neg liver ROS,GERD  Medicated and Controlled,,  Endo/Other  diabetes, Well Controlled, Type 2, Oral Hypoglycemic Agents    Renal/GU Renal InsufficiencyRenal disease (left renal cell carcinoma, partial nephrectomy)  negative genitourinary   Musculoskeletal  (+) Arthritis  (gout),  Displacement of lumbar intervertebral disc without myelopathy   Abdominal   Peds negative pediatric ROS (+)  Hematology negative hematology ROS (+)   Anesthesia Other Findings   Reproductive/Obstetrics negative OB ROS                             Anesthesia Physical Anesthesia Plan  ASA: 3  Anesthesia Plan: General   Post-op Pain Management: Dilaudid IV   Induction: Intravenous  PONV Risk Score and Plan: 4 or greater and Ondansetron and Dexamethasone  Airway Management Planned: Oral ETT  Additional Equipment:   Intra-op Plan:   Post-operative Plan: Extubation in OR  Informed Consent: I have reviewed the patients History and Physical, chart, labs and discussed the procedure including the risks, benefits and alternatives for the proposed anesthesia with the patient or  authorized representative who has indicated his/her understanding and acceptance.     Dental advisory given  Plan Discussed with: CRNA and Surgeon  Anesthesia Plan Comments:         Anesthesia Quick Evaluation

## 2023-07-06 ENCOUNTER — Encounter (HOSPITAL_COMMUNITY): Payer: Self-pay | Admitting: General Surgery

## 2023-07-12 ENCOUNTER — Other Ambulatory Visit (HOSPITAL_COMMUNITY): Payer: Self-pay | Admitting: Family Medicine

## 2023-07-12 DIAGNOSIS — Z1231 Encounter for screening mammogram for malignant neoplasm of breast: Secondary | ICD-10-CM

## 2023-07-18 ENCOUNTER — Encounter: Payer: Self-pay | Admitting: General Surgery

## 2023-07-18 ENCOUNTER — Ambulatory Visit (INDEPENDENT_AMBULATORY_CARE_PROVIDER_SITE_OTHER): Payer: Medicare Other | Admitting: General Surgery

## 2023-07-18 DIAGNOSIS — Z09 Encounter for follow-up examination after completed treatment for conditions other than malignant neoplasm: Secondary | ICD-10-CM

## 2023-07-18 NOTE — Progress Notes (Signed)
Note for postoperative visit performed with patient.  She was at home and I was in the office.  She states that she is improving.  She does have still some incisional pain but this seems to be easing.  She is starting to return to her normal activity.  I told her to gradually increase her activity as able.  Should she have any issues, she was instructed to call me.  Follow-up as needed.  Total telephone time was 3 minutes.

## 2023-08-21 ENCOUNTER — Ambulatory Visit (HOSPITAL_COMMUNITY)
Admission: RE | Admit: 2023-08-21 | Discharge: 2023-08-21 | Disposition: A | Discharge status: Home / Self Care | Payer: Medicare Other | Source: Ambulatory Visit | Attending: Family Medicine | Admitting: Family Medicine

## 2023-08-21 DIAGNOSIS — Z1231 Encounter for screening mammogram for malignant neoplasm of breast: Secondary | ICD-10-CM | POA: Insufficient documentation

## 2023-09-04 ENCOUNTER — Encounter: Payer: Self-pay | Admitting: Nutrition

## 2023-09-04 ENCOUNTER — Encounter: Payer: Medicare Other | Attending: Family Medicine | Admitting: Nutrition

## 2023-09-04 DIAGNOSIS — Z6826 Body mass index (BMI) 26.0-26.9, adult: Secondary | ICD-10-CM | POA: Diagnosis not present

## 2023-09-04 DIAGNOSIS — E785 Hyperlipidemia, unspecified: Secondary | ICD-10-CM | POA: Insufficient documentation

## 2023-09-04 DIAGNOSIS — N1832 Chronic kidney disease, stage 3b: Secondary | ICD-10-CM | POA: Insufficient documentation

## 2023-09-04 DIAGNOSIS — I129 Hypertensive chronic kidney disease with stage 1 through stage 4 chronic kidney disease, or unspecified chronic kidney disease: Secondary | ICD-10-CM | POA: Diagnosis not present

## 2023-09-04 DIAGNOSIS — Z7984 Long term (current) use of oral hypoglycemic drugs: Secondary | ICD-10-CM | POA: Diagnosis not present

## 2023-09-04 DIAGNOSIS — E1122 Type 2 diabetes mellitus with diabetic chronic kidney disease: Secondary | ICD-10-CM | POA: Insufficient documentation

## 2023-09-04 DIAGNOSIS — Z713 Dietary counseling and surveillance: Secondary | ICD-10-CM | POA: Diagnosis not present

## 2023-09-04 NOTE — Progress Notes (Signed)
Medical Nutrition Therapy  Appointment Start time:  1530  Appointment End time:  1630  Primary concerns today: Type 2 DM  Referral diagnosis: E11.8 Preferred learning style: no preference  Learning readiness: Ready    NUTRITION ASSESSMENT  77 yr old wfemale referred for Type 2 DM. Currently on Glipizide,Metformin 500 mg ER.  PCP Dr. 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 Had surgery on her kidney for kidney cancer- partial nephrectomy. Hyperlipidemia-can't take statins. Complains of poor sleep. She notes her BS are up and down. Drinks Pepsi's and eats some junk food Eats 2 meals per day  Likes to walk for exercise  She is willing to work with Lifestyle Medicine eat more whole plant based foods that come from a garden, tree or bush.    Anthropometrics  Wt Readings from Last 3 Encounters:  09/04/23 162 lb (73.5 kg)  07/03/23 160 lb (72.6 kg)  06/08/23 161 lb (73 kg)   Ht Readings from Last 3 Encounters:  09/04/23 5\' 5"  (1.651 m)  07/03/23 5\' 4"  (1.626 m)  06/08/23 5\' 4"  (1.626 m)   Body mass index is 26.96 kg/m. @BMIFA @ Facility age limit for growth %iles is 20 years. Facility age limit for growth %iles is 20 years.    Clinical Medical Hx: See chart Medications: See chart Labs: Last A1C 7.5% Elevated liver enzymes, Notable Signs/Symptoms: tired, can't sleep well.  Lifestyle & Dietary Hx Widow   Estimated daily fluid intake: 20 oz Supplements:  Sleep: poor Stress / self-care: none Current average weekly physical activity: ADL   24-Hr Dietary Recall LIves by herself. Eats 1-2 times per day.   Estimated Energy Needs Calories: 1500 Carbohydrate: 170g Protein: 112g Fat: 42g   NUTRITION DIAGNOSIS  NB-1.1 Food and nutrition-related knowledge deficit As related to Diabetes Type 2.  As evidenced by A1C 7.5%.   NUTRITION INTERVENTION  Nutrition education (E-1) on the following topics:  Nutrition and Diabetes education provided on My Plate, CHO  counting, meal planning, portion sizes, timing of meals, avoiding snacks between meals unless having a low blood sugar, target ranges for A1C and blood sugars, signs/symptoms and treatment of hyper/hypoglycemia, monitoring blood sugars, taking medications as prescribed, benefits of exercising 30 minutes per day and prevention of complications of DM.  Lifestyle Medicine  - Whole Food, Plant Predominant Nutrition is highly recommended: Eat Plenty of vegetables, Mushrooms, fruits, Legumes, Whole Grains, Nuts, seeds in lieu of processed meats, processed snacks/pastries red meat, poultry, eggs.    -It is better to avoid simple carbohydrates including: Cakes, Sweet Desserts, Ice Cream, Soda (diet and regular), Sweet Tea, Candies, Chips, Cookies, Store Bought Juices, Alcohol in Excess of  1-2 drinks a day, Lemonade,  Artificial Sweeteners, Doughnuts, Coffee Creamers, "Sugar-free" Products, etc, etc.  This is not a complete list.....  Exercise: If you are able: 30 -60 minutes a day ,4 days a week, or 150 minutes a week.  The longer the better.  Combine stretch, strength, and aerobic activities.  If you were told in the past that you have high risk for cardiovascular diseases, you may seek evaluation by your heart doctor prior to initiating moderate to intense exercise programs.   Handouts Provided Include  LIfestyle Medicine Know your numbers  Learning Style & Readiness for Change Teaching method utilized: Visual & Auditory  Demonstrated degree of understanding via: Teach Back  Barriers to learning/adherence to lifestyle change: none  Goals Established by Pt GOals  Eat three meals per ay Don't skip meals Wean from  Pepsi Increase fruits and vegetables from the garden Drink 3-4 bottles of water with lemon Get A1C down to 6.5%   MONITORING & EVALUATION Dietary intake, weekly physical activity, and blood sugars in 3 months.  Next Steps  Patient is to work on meal planning and eating 3 meals  per day.Marland Kitchen

## 2023-09-04 NOTE — Patient Instructions (Signed)
GOals  Eat three meals per ay Don't skip meals Wean from Pepsi Increase fruits and vegetables from the garden Drink 3-4 bottles of water with lemon Get A1C down to 6.5%

## 2023-09-16 IMAGING — DX DG CHEST 1V PORT
1 series · 1 of 1 positions shown · non-contrast
Comparison: Chest x-ray dated August 12, 2021.

CLINICAL DATA: Chest pain. Knot at the right clavicle.

EXAM:
PORTABLE CHEST 1 VIEW

[chest ap]
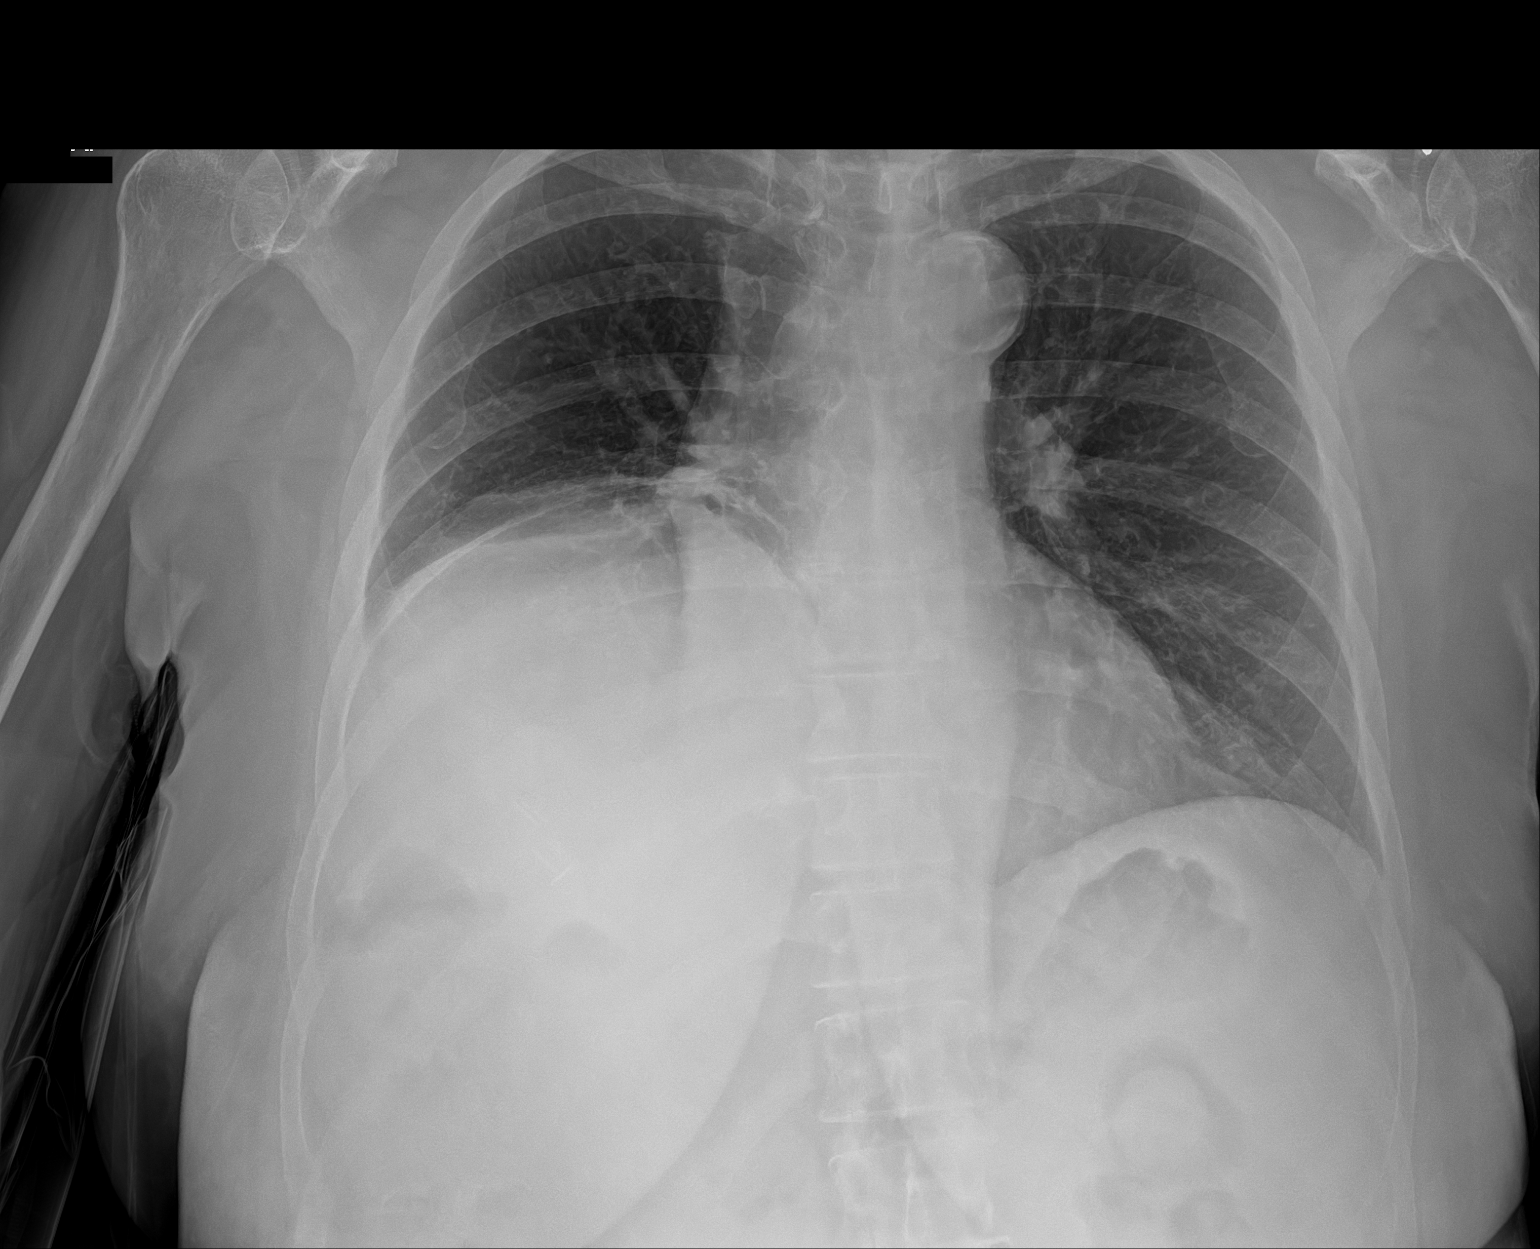

[1 of 1 positions shown; findings below may reference images not displayed]

FINDINGS: The heart size and mediastinal contours are within normal limits.
Normal pulmonary vascularity. Unchanged right hemidiaphragm
elevation with right basilar scarring. No focal consolidation,
pleural effusion, or pneumothorax. No acute osseous abnormality.
IMPRESSION: No active disease.

## 2023-09-16 IMAGING — CT CT ABD-PELV W/ CM
2 of 5 series · 14 of 46 positions shown, 16 images · IV contrast (Omnipaque or Isovue)
Comparison: Abdomen MRI, 10/07/2021.

CLINICAL DATA: Surgery to remove part her left kidney on
12/07/2021. Pain along the left abdomen in the region of the
surgery.

EXAM:
CT ABDOMEN AND PELVIS WITH CONTRAST
TECHNIQUE: Multidetector CT imaging of the abdomen and pelvis was performed
using the standard protocol following bolus administration of
intravenous contrast.
CONTRAST:  75mL OMNIPAQUE IOHEXOL 300 MG/ML  SOLN

[Series 3: axial st · axial · 0.84mm/px · z∈[+603,+1118]mm · 11 of 119 slices shown, 13 images]
[im 8/119  soft-tissue]
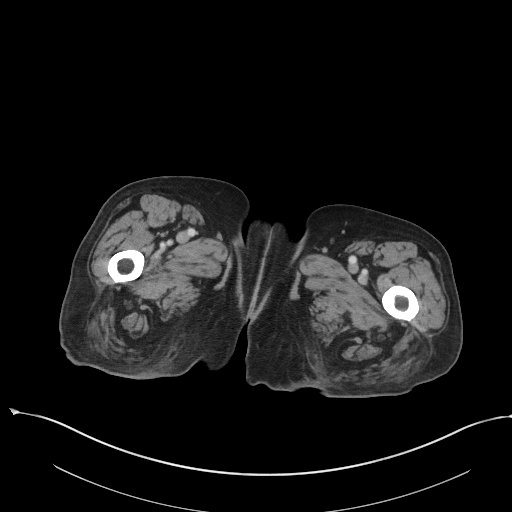
[im 8/119  bone]
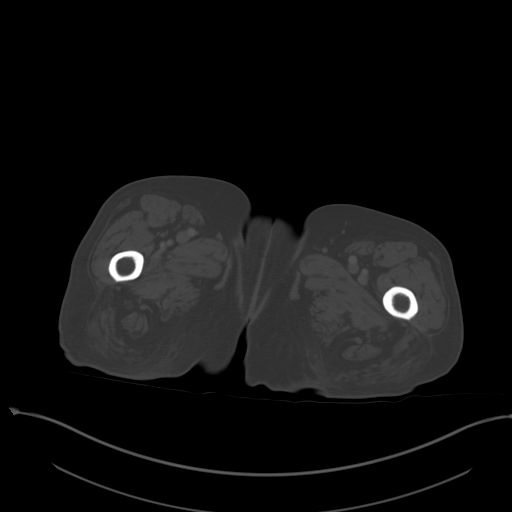
[im 16/119  soft-tissue]
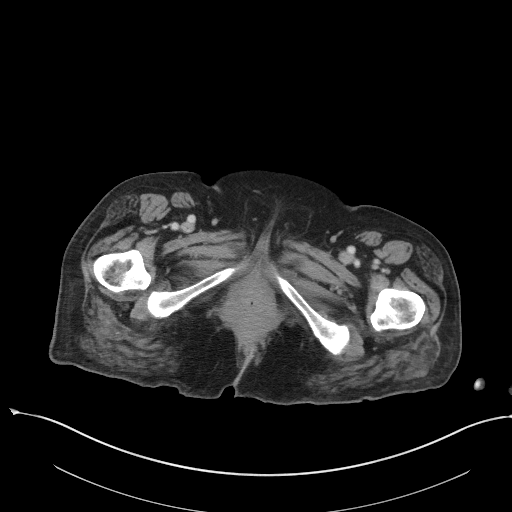
[im 32/119  soft-tissue]
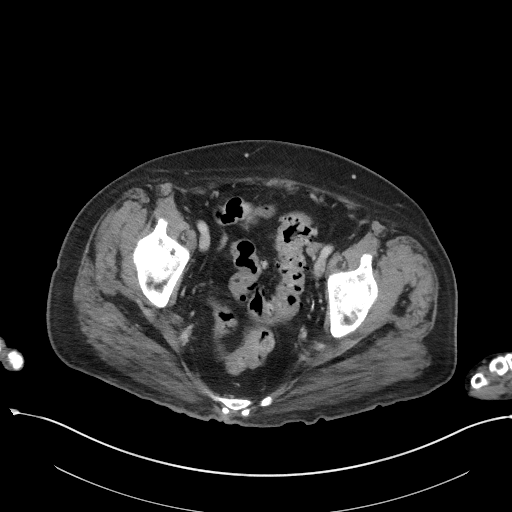
[im 40/119  soft-tissue]
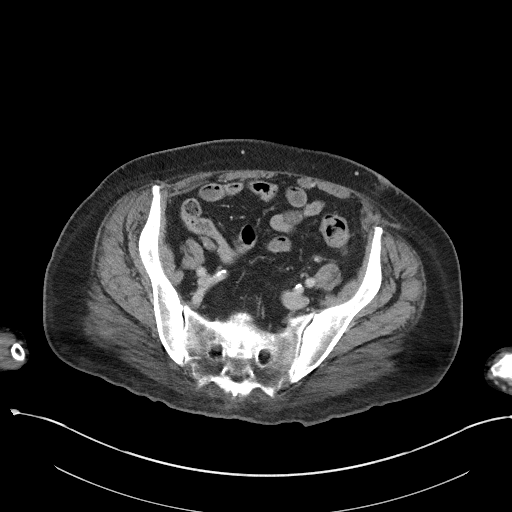
[im 48/119  soft-tissue]
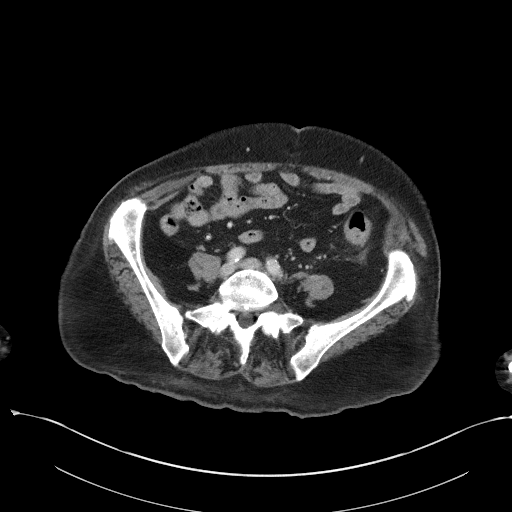
[im 63/119  soft-tissue]
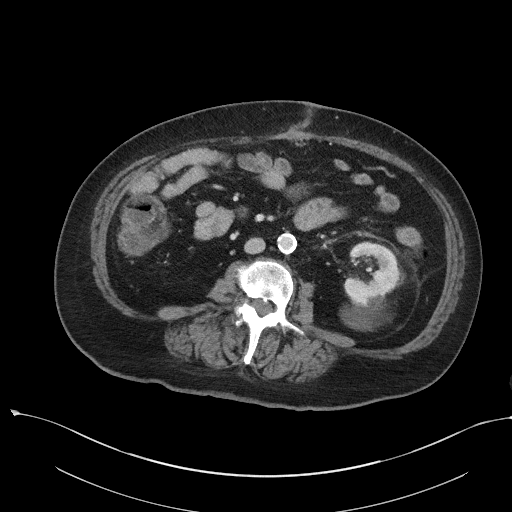
[im 71/119  soft-tissue]
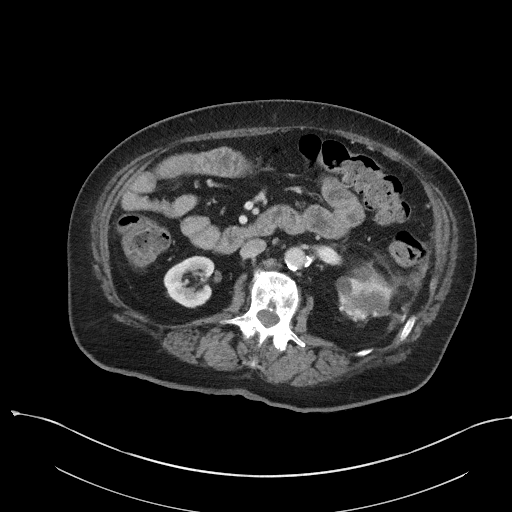
[im 79/119  soft-tissue]
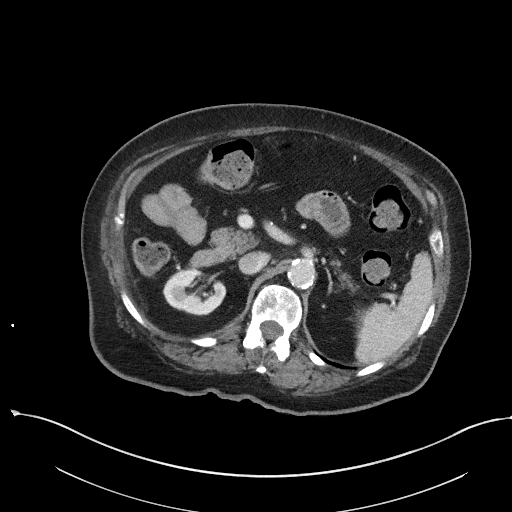
[im 87/119  soft-tissue]
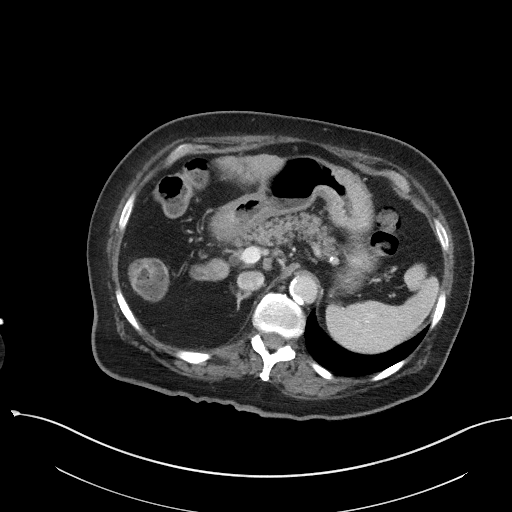
[im 87/119  bone]
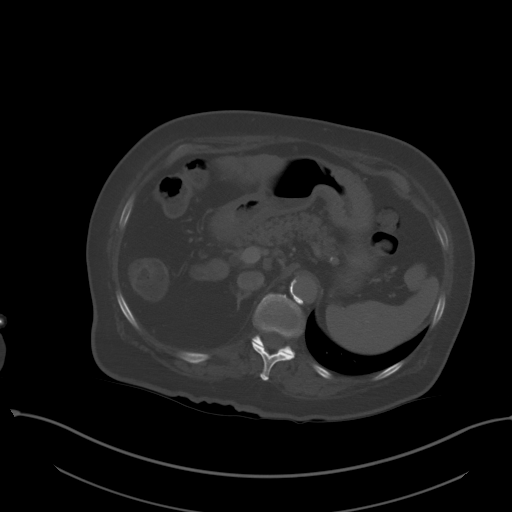
[im 103/119  soft-tissue]
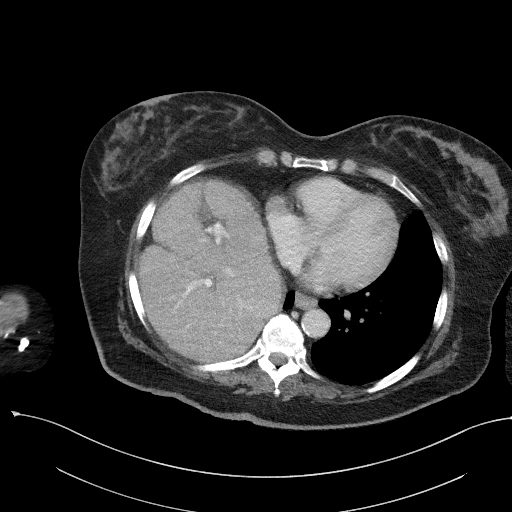
[im 111/119  soft-tissue]
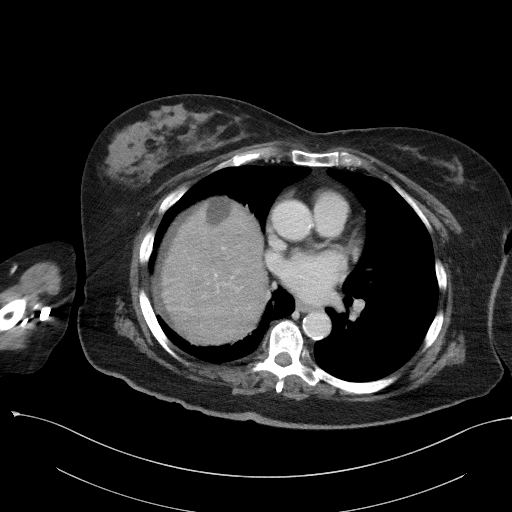

[Series 5: coronal st · coronal · 0.81mm/px · 3 of 105 slices shown]
[im 35/105  soft-tissue]
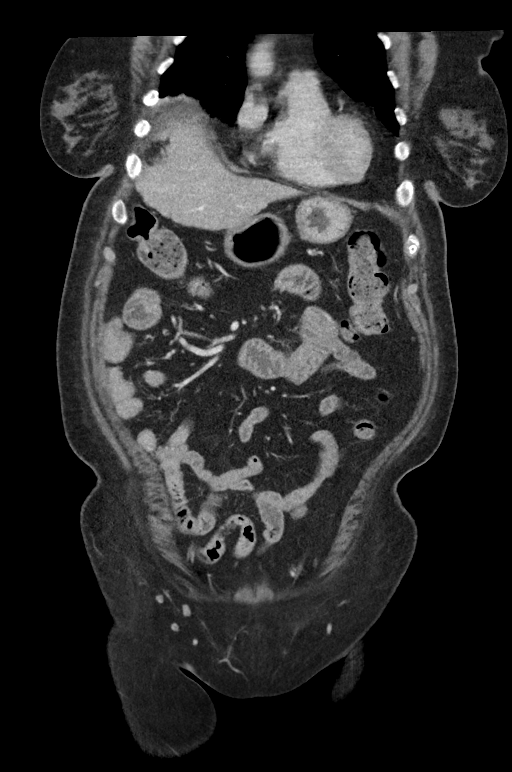
[im 47/105  soft-tissue]
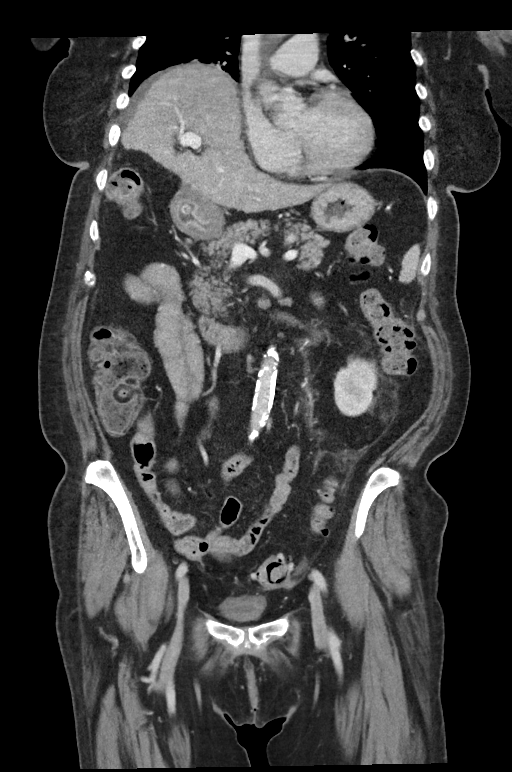
[im 58/105  soft-tissue]
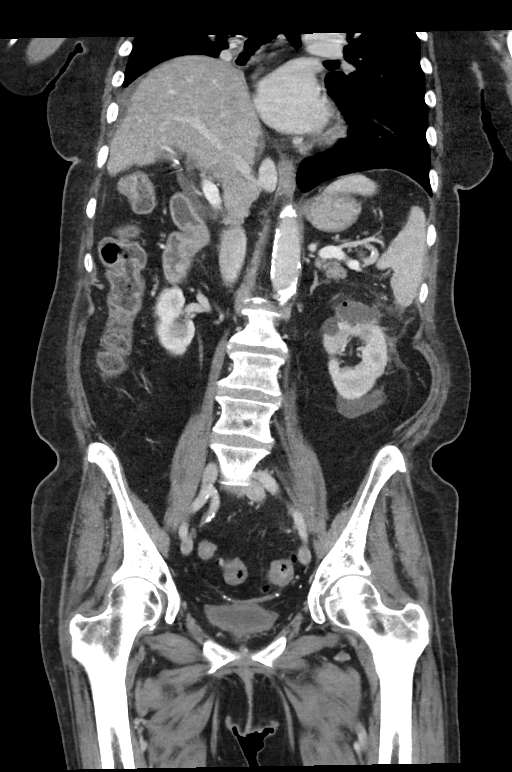

[14 of 46 positions shown; findings below may reference images not displayed]

FINDINGS: Lower chest: Dependent opacity at the right lung base consistent
with atelectasis. No convincing acute findings.

Hepatobiliary: Liver normal in size and overall attenuation. 2.1 cm
low attenuation mass at the dome of the right lobe, consistent with
a cyst, stable from the prior MRI. Liver also shows central volume
loss, relative enlargement of the lateral segment of the left lobe
and surface nodularity findings suspicious for cirrhosis.
Gallbladder surgically absent. No bile duct dilation.

Pancreas: Unremarkable. No pancreatic ductal dilatation or
surrounding inflammatory changes.

Spleen: Normal in size without focal abnormality.

Adrenals/Urinary Tract: No adrenal masses.

Low attenuation lies along the superior margin of the left kidney,
within the partial nephrectomy cavity, consistent with postoperative
fluid, measuring approximately 4.5 x 1.9 x 4.5 cm. There is a tiny
bubble of air along the superior margin of this with mild adjacent
inflammatory changes.

1.1 cm cyst arises from the medial upper pole left kidney with a
cm exophytic cyst from the posterior lower pole. No other left renal
masses. No right renal masses. Both kidneys show normal enhancement
and excretion. No renal stones. No hydronephrosis. Normal ureters.
Normal bladder.

Stomach/Bowel: Normal stomach. Small bowel and colon are normal in
caliber. No wall thickening. No inflammation. Multiple sigmoid colon
diverticula without diverticulitis. Normal appendix visualized.

Vascular/Lymphatic: Aortic atherosclerosis. No aneurysm. No enlarged
lymph nodes.

Reproductive: Status post hysterectomy. No adnexal masses.

Other: No ascites or free air.

Musculoskeletal: No fracture or acute finding.  No bone lesion.
IMPRESSION: 1. Status post partial left nephrectomy with resection of an upper
pole mass. There is fluid attenuation with mild adjacent
inflammatory type stranding within the resection bed abutting the
remaining upper pole of the left kidney. This contains a single tiny
bubble of air. This may reflect sterile postoperative fluid. An
early abscess is possible.
2. No other evidence of an acute abnormality within the abdomen or
pelvis.
3. Liver morphology suspicious for cirrhosis. No suspicious liver
mass.
4. Aortic atherosclerosis.

## 2023-09-25 DIAGNOSIS — D519 Vitamin B12 deficiency anemia, unspecified: Secondary | ICD-10-CM | POA: Diagnosis not present

## 2023-09-25 DIAGNOSIS — E1122 Type 2 diabetes mellitus with diabetic chronic kidney disease: Secondary | ICD-10-CM | POA: Diagnosis not present

## 2023-09-25 DIAGNOSIS — E782 Mixed hyperlipidemia: Secondary | ICD-10-CM | POA: Diagnosis not present

## 2023-10-02 DIAGNOSIS — R09A2 Foreign body sensation, throat: Secondary | ICD-10-CM | POA: Diagnosis not present

## 2023-10-02 DIAGNOSIS — Z6826 Body mass index (BMI) 26.0-26.9, adult: Secondary | ICD-10-CM | POA: Diagnosis not present

## 2023-10-02 DIAGNOSIS — J309 Allergic rhinitis, unspecified: Secondary | ICD-10-CM | POA: Diagnosis not present

## 2023-10-02 DIAGNOSIS — R030 Elevated blood-pressure reading, without diagnosis of hypertension: Secondary | ICD-10-CM | POA: Diagnosis not present

## 2023-10-11 DIAGNOSIS — E119 Type 2 diabetes mellitus without complications: Secondary | ICD-10-CM | POA: Diagnosis not present

## 2023-10-11 DIAGNOSIS — N1832 Chronic kidney disease, stage 3b: Secondary | ICD-10-CM | POA: Diagnosis not present

## 2023-10-11 DIAGNOSIS — E7849 Other hyperlipidemia: Secondary | ICD-10-CM | POA: Diagnosis not present

## 2023-10-11 DIAGNOSIS — I10 Essential (primary) hypertension: Secondary | ICD-10-CM | POA: Diagnosis not present

## 2023-10-11 DIAGNOSIS — E1165 Type 2 diabetes mellitus with hyperglycemia: Secondary | ICD-10-CM | POA: Diagnosis not present

## 2023-10-18 DIAGNOSIS — G43909 Migraine, unspecified, not intractable, without status migrainosus: Secondary | ICD-10-CM | POA: Diagnosis not present

## 2023-10-18 DIAGNOSIS — E1122 Type 2 diabetes mellitus with diabetic chronic kidney disease: Secondary | ICD-10-CM | POA: Diagnosis not present

## 2023-10-18 DIAGNOSIS — M8580 Other specified disorders of bone density and structure, unspecified site: Secondary | ICD-10-CM | POA: Diagnosis not present

## 2023-10-18 DIAGNOSIS — E7849 Other hyperlipidemia: Secondary | ICD-10-CM | POA: Diagnosis not present

## 2023-10-18 DIAGNOSIS — Z6826 Body mass index (BMI) 26.0-26.9, adult: Secondary | ICD-10-CM | POA: Diagnosis not present

## 2023-10-18 DIAGNOSIS — N1832 Chronic kidney disease, stage 3b: Secondary | ICD-10-CM | POA: Diagnosis not present

## 2023-10-18 DIAGNOSIS — I10 Essential (primary) hypertension: Secondary | ICD-10-CM | POA: Diagnosis not present

## 2023-10-18 DIAGNOSIS — E739 Lactose intolerance, unspecified: Secondary | ICD-10-CM | POA: Diagnosis not present

## 2023-10-18 DIAGNOSIS — Z23 Encounter for immunization: Secondary | ICD-10-CM | POA: Diagnosis not present

## 2023-10-18 DIAGNOSIS — K7581 Nonalcoholic steatohepatitis (NASH): Secondary | ICD-10-CM | POA: Diagnosis not present

## 2023-10-24 ENCOUNTER — Ambulatory Visit (INDEPENDENT_AMBULATORY_CARE_PROVIDER_SITE_OTHER): Payer: Medicare Other | Admitting: Gastroenterology

## 2023-10-24 ENCOUNTER — Encounter: Payer: Self-pay | Admitting: Gastroenterology

## 2023-10-24 VITALS — BP 138/76 | HR 69 | Temp 98.1°F | Ht 65.0 in | Wt 160.4 lb

## 2023-10-24 DIAGNOSIS — K7460 Unspecified cirrhosis of liver: Secondary | ICD-10-CM

## 2023-10-24 DIAGNOSIS — R1310 Dysphagia, unspecified: Secondary | ICD-10-CM | POA: Diagnosis not present

## 2023-10-24 DIAGNOSIS — K219 Gastro-esophageal reflux disease without esophagitis: Secondary | ICD-10-CM | POA: Diagnosis not present

## 2023-10-24 NOTE — Progress Notes (Signed)
GI Office Note    Referring Provider: Richardean Chimera, MD Primary Care Physician:  Richardean Chimera, MD  Primary Gastroenterologist: Roetta Sessions, MD   Chief Complaint   Chief Complaint  Patient presents with   Dysphagia    Feels like something is in her throat that doesn't belong    History of Present Illness   [place] is a 77 y.o. female presenting today at the request of Dr. Reuel Boom for further evaluation of GERD, dysphagia.    She was last seen in September 2022.  At that time she was noted to have elevated LFTs.  Ultrasound abdomen was obtained to further evaluate LFTs, and she was incidentally found to have solid-appearing cyst associated with the left kidney.  MR abdomen with and without contrast October 2022 burned left renal mass suspicious for renal cell carcinoma, patient subsequently underwent partial nephrectomy.  There was no evidence of abdominal metastatic disease.  Liver appeared unremarkable.  Spleen normal in size.  Follow-up CT with and without contrast March 2023 showed findings concerning for cirrhosis and a 6 mm nodule in the left hepatic lobe concerning for hepatocellular carcinoma.  MRI recommended.  Also with 6 mm low-attenuation lesion in the head/uncinate process of the pancreas recommending CT abdomen with and without contrast in 2 years.  Last CT imaging May 2024 with and without contrast again showing cirrhosis but no suspicious hepatic lesions this time.  Spleen enlarged.  Pancreas unremarkable. Postsurgical changes related to left upper pole partial nephrectomy.  Today: Feels like lump in throat. Worse in the mornings. Feels like has to heave to get relief. Causes nausea. Restarted pantoprazole recently, once daily as needed. Has not noted any improvement. Sometimes solid food and pill dysphagia. Sometimes has to spit it back out. No heartburn. BM regular on Fibercon two daily for past two weeks. Never had to take something for stools in the  past. No melena, brbpr. She was very thankful we found her kidney cancer while investigating her liver back in 2022.   Patient states she was told her more recent CTs showed findings concerning for cirrhosis but she says her PCP said it wasn't bad. On MRI in 2022 her liver was unremarkable but subsequent surveillance CTs over the past year with findings of cirrhosis and 02/2022 concern for [facility] and lesion in pancreas but later CT 04/2023 without liver or pancreas lesions. Upcoming CT planned in 5-6 weeks.   CT abdomen with and without contrast Apr 27, 2023: IMPRESSION: Postsurgical changes related to left upper pole partial nephrectomy. No evidence of recurrent or metastatic disease. Cirrhosis. Mild splenomegaly. No abdominal ascites.  EGD November 2021: - Normal esophagus. Dilated. Query occult mucosal ring at the GE junction?disrupted with dilation - Small hiatal hernia. - Normal duodenal bulb and second portion of the duodenum. - No specimens collected.   Colonoscopy February 2015: Dr. Marcha Solders -Diverticula in the sigmoid colon -Next colonoscopy February 2025   Medications   Current Outpatient Medications  Medication Sig Dispense Refill   allopurinol (ZYLOPRIM) 300 MG tablet Take 300 mg by mouth in the morning.      Cholecalciferol (VITAMIN D3) 50 MCG (2000 UT) TABS Take 2,000 Units by mouth daily.     cloNIDine (CATAPRES) 0.3 MG tablet Take 0.3 mg by mouth 2 (two) times daily.      Cyanocobalamin (B-12) 3000 MCG CAPS Take 3,000 mcg by mouth daily.     furosemide (LASIX) 20 MG tablet Take 20 mg by  mouth daily as needed for edema.      glipiZIDE (GLUCOTROL XL) 5 MG 24 hr tablet Take 5 mg by mouth in the morning.      Magnesium Bisglycinate (MAG GLYCINATE PO) Take 500 mg by mouth daily.     metFORMIN (GLUCOPHAGE-XR) 500 MG 24 hr tablet Take 500 mg by mouth at bedtime.      metoprolol tartrate (LOPRESSOR) 50 MG tablet Take 50 mg by mouth 2 (two) times daily.     Multiple Vitamin  (MULTIVITAMIN WITH MINERALS) TABS tablet Take 1 tablet by mouth daily.     pantoprazole (PROTONIX) 40 MG tablet TAKE 1 TABLET(40 MG) BY MOUTH TWICE DAILY BEFORE A MEAL (Patient taking differently: Take 40 mg by mouth 2 (two) times daily as needed (acid reflux).) 60 tablet 5   valsartan-hydrochlorothiazide (DIOVAN-HCT) 320-12.5 MG tablet Take 1 tablet by mouth daily.     No current facility-administered medications for this visit.    Allergies   Allergies as of 10/24/2023 - Review Complete 10/24/2023  Allergen Reaction Noted   Gabapentin Other (See Comments) 12/17/2018   Capoten [captopril] Cough 09/10/2019   Celexa [citalopram hydrobromide]  09/10/2019   Hydrocodone  11/11/2020   Lexapro [escitalopram oxalate]  09/10/2019   Lipitor [atorvastatin calcium] Other (See Comments) 09/13/2019   Tramadol  09/10/2019     Past Medical History   Past Medical History:  Diagnosis Date   Arthritis    CHF (congestive heart failure) (HCC)    Diabetes mellitus without complication (HCC)    Dysphagia    Family history of adverse reaction to anesthesia    Mother had problems waking up   GERD (gastroesophageal reflux disease)    Gout    Hypertension    Renal mass, left     Past Surgical History   Past Surgical History:  Procedure Laterality Date   ABDOMINAL HYSTERECTOMY     CARPAL TUNNEL RELEASE Left    CATARACT EXTRACTION W/PHACO Right 09/13/2019   Procedure: CATARACT EXTRACTION PHACO AND INTRAOCULAR LENS PLACEMENT RIGHT EYE;  Surgeon: Fabio Pierce, MD;  Location: [facility];  Service: Ophthalmology;  Laterality: Right;  right   CATARACT EXTRACTION W/PHACO Left 09/27/2019   Procedure: CATARACT EXTRACTION PHACO AND INTRAOCULAR LENS PLACEMENT LEFT EYE  (CDE: 8.04);  Surgeon: Fabio Pierce, MD;  Location: [facility];  Service: Ophthalmology;  Laterality: Left;   ESOPHAGOGASTRODUODENOSCOPY N/A 11/18/2020   Procedure: ESOPHAGOGASTRODUODENOSCOPY (EGD);  Surgeon: Corbin Ade, MD;  Location: [facility];  Service: Endoscopy;  Laterality: N/A;  7:30am   MALONEY DILATION N/A 11/18/2020   Procedure: Elease Hashimoto DILATION;  Surgeon: Corbin Ade, MD;  Location: [facility];  Service: Endoscopy;  Laterality: N/A;   ROBOTIC ASSITED PARTIAL NEPHRECTOMY Left 12/07/2021   Procedure: XI ROBOTIC ASSITED PARTIAL NEPHRECTOMY;  Surgeon: Malen Gauze, MD;  Location: [facility];  Service: Urology;  Laterality: Left;   TRIGGER FINGER RELEASE Right    TUBAL LIGATION     WISDOM TOOTH EXTRACTION     XI ROBOTIC ASSISTED VENTRAL HERNIA N/A 07/03/2023   Procedure: XI ROBOTIC ASSISTED VENTRAL HERNIA;  Surgeon: Franky Macho, MD;  Location: [facility];  Service: General;  Laterality: N/A;    Past Family History   Family History  Problem Relation Age of Onset   Hyperlipidemia Mother    Heart attack Mother    Parkinson's disease Father    Stroke Father 62   Hyperlipidemia Sister    Alcoholism Brother    Diabetes Brother  Heart failure Paternal Grandmother    Colon cancer Neg Hx    Gastric cancer Neg Hx    Esophageal cancer Neg Hx    Liver disease Neg Hx    Pancreatic disease Neg Hx     Past Social History   Social History   Socioeconomic History   Marital status: Widowed    Spouse name: Not on file   Number of children: Not on file   Years of education: Not on file   Highest education level: Not on file  Occupational History   Not on file  Tobacco Use   Smoking status: Former    Current packs/day: 0.00    Average packs/day: 0.5 packs/day for 6.0 years (3.0 ttl pk-yrs)    Types: Cigarettes    Start date: 09/10/1979    Quit date: 09/09/1985    Years since quitting: 38.1   Smokeless tobacco: Never  Vaping Use   Vaping status: Never Used  Substance and Sexual Activity   Alcohol use: Not Currently    Comment: no h/o etoh use   Drug use: Never   Sexual activity: Not Currently  Other Topics Concern   Not on file  Social History Narrative   Not on file   Social Determinants of  Health   Financial Resource Strain: Not on file  Food Insecurity: Not on file  Transportation Needs: Not on file  Physical Activity: Not on file  Stress: Not on file  Social Connections: Not on file  Intimate Partner Violence: Not on file    Review of Systems   General: Negative for anorexia, weight loss, fever, chills, fatigue, weakness. ENT: Negative for hoarseness,  nasal congestion. CV: Negative for chest pain, angina, palpitations, dyspnea on exertion, peripheral edema.  Respiratory: Negative for dyspnea at rest, dyspnea on exertion, cough, sputum, wheezing.  GI: See history of present illness. GU:  Negative for dysuria, hematuria, urinary incontinence, urinary frequency, nocturnal urination.  Endo: Negative for unusual weight change.     Physical Exam   BP 138/76 (BP Location: Right Arm, Patient Position: Sitting, Cuff Size: Normal)   Pulse 69   Temp 98.1 F (36.7 C) (Oral)   Ht 5\' 5"  (1.651 m)   Wt 160 lb 6.4 oz (72.8 kg)   SpO2 98%   BMI 26.69 kg/m    General: Well-nourished, well-developed in no acute distress.  Eyes: No icterus. Mouth: Oropharyngeal mucosa moist and pink   Lungs: Clear to auscultation bilaterally.  Heart: Regular rate and rhythm, no murmurs rubs or gallops.  Abdomen: Bowel sounds are normal, nontender, nondistended, no hepatosplenomegaly or masses,  no abdominal bruits or hernia , no rebound or guarding.  Rectal: not performed  Extremities: No lower extremity edema. No clubbing or deformities. Neuro: Alert and oriented x 4   Skin: Warm and dry, no jaundice.   Psych: Alert and cooperative, normal mood and affect.  Labs   Lab Results  Component Value Date   NA 140 04/25/2023   CL 95 (L) 04/25/2023   K 4.1 04/25/2023   CO2 27 04/25/2023   BUN 36 (H) 04/25/2023   CREATININE 1.49 (H) 04/25/2023   EGFR 36 (L) 04/25/2023   CALCIUM 9.6 04/25/2023   ALBUMIN 4.0 04/25/2023   GLUCOSE 136 (H) 04/25/2023   Lab Results  Component Value Date    ALT 30 04/25/2023   AST 44 (H) 04/25/2023   ALKPHOS 110 04/25/2023   BILITOT 0.5 04/25/2023   Lab Results  Component Value Date  WBC 11.5 (H) 01/02/2022   HGB 12.0 01/02/2022   HCT 36.9 01/02/2022   MCV 87.9 01/02/2022   PLT 228 01/02/2022   Lab Results  Component Value Date   INR 1.1 11/26/2021    Imaging Studies   No results found.  Assessment/Plan:   GERD -possible cause of am globus symptoms, nausea -continue pantoprazole daily for now -EGD as outlined  Dysphagia -possibly due to esophagitis/gerd but need to consider esophageal stricture -EGD with possible esophageal dilation (unless varices present). ASA 3.  I have discussed the risks, alternatives, benefits with regards to but not limited to the risk of reaction to medication, bleeding, infection, perforation and the patient is agreeable to proceed. Written consent to be obtained.  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  -avoid raw seafood -2 gram sodium diet   Liver lesion -seen on 2023 CT but not mentioned on 04/2023 CT -await upcoming CT  Pancreatic lesion -seen on 2023 CT but not mentioned on 04/2023 CT -await upcoming CT      Leanna Battles. Melvyn Neth, MHS, PA-C [facility]

## 2023-10-24 NOTE — Patient Instructions (Addendum)
For your liver: please update your labs so that we can properly determine how well your liver is functioning. I will also follow up upcoming CT scan that is scheduled. You need to avoid raw seafood as this could put you at risk of a serious infection due to scarring in the liver.  For your throat/swallowing issues: please take pantoprazole 40mg  daily before breakfast. We will schedule you for an upper endoscopy in the near future.

## 2023-10-25 ENCOUNTER — Telehealth: Payer: Self-pay | Admitting: *Deleted

## 2023-10-25 NOTE — Telephone Encounter (Signed)
LMOVM to call back to schedule EGD +/- ED with Dr. Jena Gauss, ASA 3, no metformin day before procedure and no DM meds day of

## 2023-10-26 LAB — PROTIME-INR
INR: 1 (ref 0.9–1.2)
Prothrombin Time: 11.4 s (ref 9.1–12.0)

## 2023-10-26 LAB — CBC WITH DIFFERENTIAL/PLATELET
Basophils Absolute: 0.1 10*3/uL (ref 0.0–0.2)
Basos: 1 %
EOS (ABSOLUTE): 0.2 10*3/uL (ref 0.0–0.4)
Eos: 3 %
Hematocrit: 40.1 % (ref 34.0–46.6)
Hemoglobin: 12.7 g/dL (ref 11.1–15.9)
Immature Grans (Abs): 0 10*3/uL (ref 0.0–0.1)
Immature Granulocytes: 0 %
Lymphocytes Absolute: 2.6 10*3/uL (ref 0.7–3.1)
Lymphs: 33 %
MCH: 27.7 pg (ref 26.6–33.0)
MCHC: 31.7 g/dL (ref 31.5–35.7)
MCV: 87 fL (ref 79–97)
Monocytes Absolute: 0.6 10*3/uL (ref 0.1–0.9)
Monocytes: 8 %
Neutrophils Absolute: 4.5 10*3/uL (ref 1.4–7.0)
Neutrophils: 55 %
Platelets: 184 10*3/uL (ref 150–450)
RBC: 4.59 x10E6/uL (ref 3.77–5.28)
RDW: 15.2 % (ref 11.7–15.4)
WBC: 8.1 10*3/uL (ref 3.4–10.8)

## 2023-10-26 LAB — COMPREHENSIVE METABOLIC PANEL
ALT: 31 IU/L (ref 0–32)
AST: 44 [IU]/L — ABNORMAL HIGH (ref 0–40)
Albumin: 4.2 g/dL (ref 3.8–4.8)
Alkaline Phosphatase: 118 IU/L (ref 44–121)
BUN/Creatinine Ratio: 22 (ref 12–28)
BUN: 23 mg/dL (ref 8–27)
Bilirubin Total: 0.5 mg/dL (ref 0.0–1.2)
CO2: 25 mmol/L (ref 20–29)
Calcium: 9.5 mg/dL (ref 8.7–10.3)
Chloride: 102 mmol/L (ref 96–106)
Creatinine, Ser: 1.05 mg/dL — ABNORMAL HIGH (ref 0.57–1.00)
Globulin, Total: 2.2 g/dL (ref 1.5–4.5)
Glucose: 132 mg/dL — ABNORMAL HIGH (ref 70–99)
Potassium: 4 mmol/L (ref 3.5–5.2)
Sodium: 142 mmol/L (ref 134–144)
Total Protein: 6.4 g/dL (ref 6.0–8.5)
eGFR: 55 mL/min/{1.73_m2} — ABNORMAL LOW (ref >59)

## 2023-10-26 LAB — IRON,TIBC AND FERRITIN PANEL
Ferritin: 48 ng/mL (ref 15–150)
Iron Saturation: 12 % — ABNORMAL LOW (ref 15–55)
Iron: 48 ug/dL (ref 27–139)
Total Iron Binding Capacity: 409 ug/dL (ref 250–450)
UIBC: 361 ug/dL (ref 118–369)

## 2023-10-26 LAB — MITOCHONDRIAL/SMOOTH MUSCLE AB PNL
Mitochondrial Ab: <20 U (ref 0.0–20.0)
Smooth Muscle Ab: 6 U (ref 0–19)

## 2023-10-26 LAB — IGG, IGA, IGM
IgA/Immunoglobulin A, Serum: 127 mg/dL (ref 64–422)
IgG (Immunoglobin G), Serum: 891 mg/dL (ref 586–1602)
IgM (Immunoglobulin M), Srm: 38 mg/dL (ref 26–217)

## 2023-10-26 LAB — HEPATITIS A ANTIBODY, TOTAL: hep A Total Ab: NEGATIVE

## 2023-10-26 LAB — HEPATITIS B SURFACE ANTIBODY,QUALITATIVE: Hep B Surface Ab, Qual: NONREACTIVE

## 2023-10-26 LAB — ANA: Anti Nuclear Antibody (ANA): NEGATIVE

## 2023-10-26 LAB — TISSUE TRANSGLUTAMINASE, IGA: Transglutaminase IgA: <2 U/mL (ref 0–3)

## 2023-10-26 LAB — AFP TUMOR MARKER: AFP, Serum, Tumor Marker: 7.4 ng/mL (ref 0.0–9.2)

## 2023-10-26 NOTE — Telephone Encounter (Signed)
Spoke with pt. She has been scheduled for 12/11. Aware will call back with pre-op appt. Instructions to be mailed.

## 2023-10-30 ENCOUNTER — Encounter: Payer: Self-pay | Admitting: *Deleted

## 2023-11-06 ENCOUNTER — Ambulatory Visit: Payer: Medicare Other | Admitting: Nutrition

## 2023-11-07 DIAGNOSIS — Z23 Encounter for immunization: Secondary | ICD-10-CM | POA: Diagnosis not present

## 2023-11-09 DIAGNOSIS — Z23 Encounter for immunization: Secondary | ICD-10-CM | POA: Diagnosis not present

## 2023-11-29 ENCOUNTER — Ambulatory Visit (HOSPITAL_COMMUNITY)
Admission: RE | Admit: 2023-11-29 | Discharge: 2023-11-29 | Disposition: A | Discharge status: Home / Self Care | Payer: Medicare Other | Source: Ambulatory Visit | Attending: Urology | Admitting: Urology

## 2023-11-29 ENCOUNTER — Other Ambulatory Visit: Payer: Medicare Other

## 2023-11-29 DIAGNOSIS — C642 Malignant neoplasm of left kidney, except renal pelvis: Secondary | ICD-10-CM | POA: Diagnosis not present

## 2023-11-29 DIAGNOSIS — Z905 Acquired absence of kidney: Secondary | ICD-10-CM

## 2023-11-29 DIAGNOSIS — R9389 Abnormal findings on diagnostic imaging of other specified body structures: Secondary | ICD-10-CM | POA: Diagnosis not present

## 2023-11-29 DIAGNOSIS — K7689 Other specified diseases of liver: Secondary | ICD-10-CM | POA: Diagnosis not present

## 2023-11-29 MED ORDER — IOHEXOL 300 MG/ML  SOLN
75.0000 mL | Freq: Once | INTRAMUSCULAR | Status: AC | PRN
  Administered 2023-11-29: 75 mL via INTRAVENOUS

## 2023-11-30 ENCOUNTER — Telehealth: Payer: Self-pay

## 2023-11-30 LAB — COMPREHENSIVE METABOLIC PANEL
ALT: 34 [IU]/L — ABNORMAL HIGH (ref 0–32)
AST: 53 [IU]/L — ABNORMAL HIGH (ref 0–40)
Albumin: 3.9 g/dL (ref 3.8–4.8)
Alkaline Phosphatase: 108 [IU]/L (ref 44–121)
BUN/Creatinine Ratio: 15 (ref 12–28)
BUN: 17 mg/dL (ref 8–27)
Bilirubin Total: 1.1 mg/dL (ref 0.0–1.2)
CO2: 23 mmol/L (ref 20–29)
Calcium: 9 mg/dL (ref 8.7–10.3)
Chloride: 97 mmol/L (ref 96–106)
Creatinine, Ser: 1.11 mg/dL — ABNORMAL HIGH (ref 0.57–1.00)
Globulin, Total: 2.2 g/dL (ref 1.5–4.5)
Glucose: 209 mg/dL — ABNORMAL HIGH (ref 70–99)
Potassium: 3.9 mmol/L (ref 3.5–5.2)
Sodium: 136 mmol/L (ref 134–144)
Total Protein: 6.1 g/dL (ref 6.0–8.5)
eGFR: 52 mL/min/{1.73_m2} — ABNORMAL LOW (ref >59)

## 2023-11-30 NOTE — Telephone Encounter (Signed)
-----   Message from [place]nita [place] sent at 11/30/2023 11:39 AM EST ----- Please let pt know that her labs yesterday showed stable renal function compared to prior.

## 2023-11-30 NOTE — Telephone Encounter (Signed)
Patient is made aware and voiced understanding. 

## 2023-12-04 NOTE — Progress Notes (Unsigned)
Name: Debra Vasquez DOB: 1946-07-01 MRN: [id number]  History of Present Illness: Debra Vasquez is a 77 y.o. female who presents today for follow up visit at [facility] [place]. - GU history: 1. Left renal cell carcinoma. Underwent robotic-assisted left partial nephrectomy on 12/07/2021. Pathology T3a.   At last visit on 06/08/2023: Doing well. The plan was follow up in 6 months with repeat CT abdomen/pelvis w/wo contrast, chest xray, and CMP for cancer surveillance.  Since last visit: > 11/29/2023:  - CMP showed stable renal function compared to prior (creatinine 1.11, GFR 52). - CT abdomen/pelvis w/wo contrast: *** - Chest xray: ***  Today: She reports ***  She {Actions; denies-reports:120008} increased urinary urgency, frequency, nocturia, dysuria, gross hematuria, hesitancy, straining to void, or sensations of incomplete emptying. She {Actions; denies-reports:120008} flank pain or abdominal pain.   Fall Screening: Do you usually have a device to assist in your mobility? {yes/no:20286} ***cane / ***walker / ***wheelchair   Medications: Current Outpatient Medications  Medication Sig Dispense Refill   allopurinol (ZYLOPRIM) 300 MG tablet Take 300 mg by mouth in the morning.      Cholecalciferol (VITAMIN D3) 50 MCG (2000 UT) TABS Take 2,000 Units by mouth daily.     cloNIDine (CATAPRES) 0.3 MG tablet Take 0.3 mg by mouth 2 (two) times daily.      Cyanocobalamin (B-12) 3000 MCG CAPS Take 3,000 mcg by mouth daily.     furosemide (LASIX) 20 MG tablet Take 20 mg by mouth daily as needed for edema.      glipiZIDE (GLUCOTROL XL) 5 MG 24 hr tablet Take 5 mg by mouth in the morning.      Magnesium Bisglycinate (MAG GLYCINATE PO) Take 500 mg by mouth daily.     metFORMIN (GLUCOPHAGE-XR) 500 MG 24 hr tablet Take 500 mg by mouth at bedtime.      metoprolol tartrate (LOPRESSOR) 50 MG tablet Take 50 mg by mouth 2 (two) times daily.     Multiple Vitamin (MULTIVITAMIN WITH  MINERALS) TABS tablet Take 1 tablet by mouth daily.     pantoprazole (PROTONIX) 40 MG tablet TAKE 1 TABLET(40 MG) BY MOUTH TWICE DAILY BEFORE A MEAL (Patient taking differently: Take 40 mg by mouth 2 (two) times daily as needed (acid reflux).) 60 tablet 5   valsartan-hydrochlorothiazide (DIOVAN-HCT) 320-12.5 MG tablet Take 1 tablet by mouth daily.     No current facility-administered medications for this visit.    Allergies: Allergies  Allergen Reactions   Gabapentin Other (See Comments)    syncope   Capoten [Captopril] Cough   Celexa [Citalopram Hydrobromide]     Passed out   Hydrocodone     "awake all night", felt terrible   Lexapro [Escitalopram Oxalate]     "see psychodelic lights"   Lipitor [Atorvastatin Calcium] Other (See Comments)    Achy legs   Tramadol     confusion    Past Medical History:  Diagnosis Date   Arthritis    CHF (congestive heart failure) (HCC)    Diabetes mellitus without complication (HCC)    Dysphagia    Family history of adverse reaction to anesthesia    Mother had problems waking up   GERD (gastroesophageal reflux disease)    Gout    Hypertension    Renal mass, left    Past Surgical History:  Procedure Laterality Date   ABDOMINAL HYSTERECTOMY     CARPAL TUNNEL RELEASE Left    CATARACT EXTRACTION W/PHACO Right 09/13/2019  Procedure: CATARACT EXTRACTION PHACO AND INTRAOCULAR LENS PLACEMENT RIGHT EYE;  Surgeon: Fabio Pierce, MD;  Location: [facility];  Service: Ophthalmology;  Laterality: Right;  right   CATARACT EXTRACTION W/PHACO Left 09/27/2019   Procedure: CATARACT EXTRACTION PHACO AND INTRAOCULAR LENS PLACEMENT LEFT EYE  (CDE: 8.04);  Surgeon: Fabio Pierce, MD;  Location: [facility];  Service: Ophthalmology;  Laterality: Left;   ESOPHAGOGASTRODUODENOSCOPY N/A 11/18/2020   Procedure: ESOPHAGOGASTRODUODENOSCOPY (EGD);  Surgeon: Corbin Ade, MD;  Location: [facility];  Service: Endoscopy;  Laterality: N/A;  7:30am   MALONEY DILATION N/A  11/18/2020   Procedure: Elease Hashimoto DILATION;  Surgeon: Corbin Ade, MD;  Location: [facility];  Service: Endoscopy;  Laterality: N/A;   ROBOTIC ASSITED PARTIAL NEPHRECTOMY Left 12/07/2021   Procedure: XI ROBOTIC ASSITED PARTIAL NEPHRECTOMY;  Surgeon: Malen Gauze, MD;  Location: [facility];  Service: Urology;  Laterality: Left;   TRIGGER FINGER RELEASE Right    TUBAL LIGATION     WISDOM TOOTH EXTRACTION     XI ROBOTIC ASSISTED VENTRAL HERNIA N/A 07/03/2023   Procedure: XI ROBOTIC ASSISTED VENTRAL HERNIA;  Surgeon: Franky Macho, MD;  Location: [facility];  Service: General;  Laterality: N/A;   Family History  Problem Relation Age of Onset   Hyperlipidemia Mother    Heart attack Mother    Parkinson's disease Father    Stroke Father 24   Hyperlipidemia Sister    Alcoholism Brother    Diabetes Brother    Heart failure Paternal Grandmother    Colon cancer Neg Hx    Gastric cancer Neg Hx    Esophageal cancer Neg Hx    Liver disease Neg Hx    Pancreatic disease Neg Hx    Social History   Socioeconomic History   Marital status: Widowed    Spouse name: Not on file   Number of children: Not on file   Years of education: Not on file   Highest education level: Not on file  Occupational History   Not on file  Tobacco Use   Smoking status: Former    Current packs/day: 0.00    Average packs/day: 0.5 packs/day for 6.0 years (3.0 ttl pk-yrs)    Types: Cigarettes    Start date: 09/10/1979    Quit date: 09/09/1985    Years since quitting: 38.2   Smokeless tobacco: Never  Vaping Use   Vaping status: Never Used  Substance and Sexual Activity   Alcohol use: Not Currently    Comment: no h/o etoh use   Drug use: Never   Sexual activity: Not Currently  Other Topics Concern   Not on file  Social History Narrative   Not on file   Social Determinants of Health   Financial Resource Strain: Not on file  Food Insecurity: Not on file  Transportation Needs: Not on file  Physical  Activity: Not on file  Stress: Not on file  Social Connections: Not on file  Intimate Partner Violence: Not on file    Review of Systems*** Constitutional: Patient denies any unintentional weight loss or change in strength lntegumentary: Patient denies any rashes or pruritus Cardiovascular: Patient denies chest pain or syncope Respiratory: Patient denies shortness of breath Gastrointestinal: Patient ***denies nausea, vomiting, constipation, or diarrhea Musculoskeletal: Patient denies muscle cramps or weakness Neurologic: Patient denies convulsions or seizures Allergic/Immunologic: Patient denies recent allergic reaction(s) Hematologic/Lymphatic: Patient denies bleeding tendencies Endocrine: Patient denies heat/cold intolerance  GU: As per HPI.  OBJECTIVE There were no vitals filed for this  visit. There is no height or weight on file to calculate BMI.  Physical Examination*** Constitutional: No obvious distress; patient is non-toxic appearing  Cardiovascular: No visible lower extremity edema.  Respiratory: The patient does not have audible wheezing/stridor; respirations do not appear labored  Gastrointestinal: Abdomen non-distended Musculoskeletal: Normal ROM of UEs  Skin: No obvious rashes/open sores  Neurologic: CN 2-12 grossly intact Psychiatric: Answered questions appropriately with normal affect  Hematologic/Lymphatic/Immunologic: No obvious bruises or sites of spontaneous bleeding  UA: ***negative *** WBC/hpf, *** RBC/hpf, *** bacteria ***with no evidence of UTI ***with no evidence of microscopic hematuria PVR: *** ml  ASSESSMENT No diagnosis found. ***  Will plan for follow up in *** months / ***1 year or sooner if needed. Pt verbalized understanding and agreement. All questions were answered.  PLAN Advised the following: 1. *** 2. ***No follow-ups on file.  No orders of the defined types were placed in this encounter.   It has been explained that the  patient is to follow regularly with their PCP in addition to all other providers involved in their care and to follow instructions provided by these respective offices. Patient advised to contact urology clinic if any urologic-pertaining questions, concerns, new symptoms or problems arise in the interim period.  There are no Patient Instructions on file for this visit.  Electronically signed by:  Donnita Falls, FNP   12/04/23    9:30 AM

## 2023-12-05 ENCOUNTER — Ambulatory Visit: Payer: Medicare Other | Admitting: Urology

## 2023-12-05 ENCOUNTER — Encounter: Payer: Self-pay | Admitting: Urology

## 2023-12-05 ENCOUNTER — Encounter (HOSPITAL_COMMUNITY)
Admission: RE | Admit: 2023-12-05 | Discharge: 2023-12-05 | Disposition: A | Discharge status: Home / Self Care | Payer: Medicare Other | Source: Ambulatory Visit | Attending: Internal Medicine | Admitting: Internal Medicine

## 2023-12-05 VITALS — BP 152/70 | HR 53 | Temp 97.3°F

## 2023-12-05 DIAGNOSIS — C642 Malignant neoplasm of left kidney, except renal pelvis: Secondary | ICD-10-CM

## 2023-12-05 DIAGNOSIS — Z85528 Personal history of other malignant neoplasm of kidney: Secondary | ICD-10-CM | POA: Diagnosis not present

## 2023-12-05 DIAGNOSIS — Z905 Acquired absence of kidney: Secondary | ICD-10-CM | POA: Diagnosis not present

## 2023-12-05 DIAGNOSIS — Z08 Encounter for follow-up examination after completed treatment for malignant neoplasm: Secondary | ICD-10-CM

## 2023-12-05 LAB — URINALYSIS, ROUTINE W REFLEX MICROSCOPIC
Bilirubin, UA: NEGATIVE
Glucose, UA: NEGATIVE
Ketones, UA: NEGATIVE
Nitrite, UA: NEGATIVE
Protein,UA: NEGATIVE
RBC, UA: NEGATIVE
Specific Gravity, UA: 1.025 (ref 1.005–1.030)
Urobilinogen, Ur: 1 mg/dL (ref 0.2–1.0)
pH, UA: 5.5 (ref 5.0–7.5)

## 2023-12-05 LAB — MICROSCOPIC EXAMINATION: Bacteria, UA: NONE SEEN

## 2023-12-05 NOTE — Patient Instructions (Signed)
20    Your procedure is scheduled on: 12/06/2023  Report to [facility] Main Entrance at  7:45   AM.  Call this number if you have problems the morning of surgery: [phone]   Remember:   Follow instructions on letter from office regarding when to stop eating and drinking        No Smoking the day of procedure      Take these medicines the morning of surgery with A SIP OF WATER: metoprolol and Pantoprazole   Do not wear jewelry, make-up or nail polish.  Do not wear lotions, powders, or perfumes. You may wear deodorant.                Do not bring valuables to the hospital.  Contacts, dentures or bridgework may not be worn into surgery.  Leave suitcase in the car. After surgery it may be brought to your room.  For patients admitted to the hospital, checkout time is 11:00 AM the day of discharge.   Patients discharged the day of surgery will not be allowed to drive home. Upper Endoscopy, Adult Upper endoscopy is a procedure to look inside the upper GI (gastrointestinal) tract. The upper GI tract is made up of: The part of the body that moves food from your mouth to your stomach (esophagus). The stomach. The first part of your small intestine (duodenum). This procedure is also called esophagogastroduodenoscopy (EGD) or gastroscopy. In this procedure, your health care provider passes a thin, flexible tube (endoscope) through your mouth and down your esophagus into your stomach. A small camera is attached to the end of the tube. Images from the camera appear on a monitor in the exam room. During this procedure, your health care provider may also remove a small piece of tissue to be sent to a lab and examined under a microscope (biopsy). Your health care provider may do an upper endoscopy to diagnose cancers of the upper GI tract. You may also have this procedure to find the cause of other conditions, such as: Stomach pain. Heartburn. Pain or problems when swallowing. Nausea and  vomiting. Stomach bleeding. Stomach ulcers. Tell a health care provider about: Any allergies you have. All medicines you are taking, including vitamins, herbs, eye drops, creams, and over-the-counter medicines. Any problems you or family members have had with anesthetic medicines. Any blood disorders you have. Any surgeries you have had. Any medical conditions you have. Whether you are pregnant or may be pregnant. What are the risks? Generally, this is a safe procedure. However, problems may occur, including: Infection. Bleeding. Allergic reactions to medicines. A tear or hole (perforation) in the esophagus, stomach, or duodenum. What happens before the procedure? Staying hydrated Follow instructions from your health care provider about hydration, which may include: Up to 4 hours before the procedure - you may continue to drink clear liquids, such as water, clear fruit juice, black coffee, and plain tea.   Medicines Ask your health care provider about: Changing or stopping your regular medicines. This is especially important if you are taking diabetes medicines or blood thinners. Taking medicines such as aspirin and ibuprofen. These medicines can thin your blood. Do not take these medicines unless your health care provider tells you to take them. Taking over-the-counter medicines, vitamins, herbs, and supplements. General instructions Plan to have someone take you home from the hospital or clinic. If you will be going home right after the procedure, plan to have someone with you for 24 hours. Ask your  health care provider what steps will be taken to help prevent infection. What happens during the procedure?  An IV will be inserted into one of your veins. You may be given one or more of the following: A medicine to help you relax (sedative). A medicine to numb the throat (local anesthetic). You will lie on your left side on an exam table. Your health care provider will pass the  endoscope through your mouth and down your esophagus. Your health care provider will use the scope to check the inside of your esophagus, stomach, and duodenum. Biopsies may be taken. The endoscope will be removed. The procedure may vary among health care providers and hospitals. What happens after the procedure? Your blood pressure, heart rate, breathing rate, and blood oxygen level will be monitored until you leave the hospital or clinic. Do not drive for 24 hours if you were given a sedative during your procedure. When your throat is no longer numb, you may be given some fluids to drink. It is up to you to get the results of your procedure. Ask your health care provider, or the department that is doing the procedure, when your results will be ready. Summary Upper endoscopy is a procedure to look inside the upper GI tract. During the procedure, an IV will be inserted into one of your veins. You may be given a medicine to help you relax. A medicine will be used to numb your throat. The endoscope will be passed through your mouth and down your esophagus. This information is not intended to replace advice given to you by your health care provider. Make sure you discuss any questions you have with your health care provider. Document Revised: 06/06/2018 Document Reviewed: 05/14/2018 Elsevier Patient Education  2020 Elsevier Inc.                                                                                                                                      EndoscopyCare After  Please read the instructions outlined below and refer to this sheet in the next few weeks. These discharge instructions provide you with general information on caring for yourself after you leave the hospital. Your doctor may also give you specific instructions. While your treatment has been planned according to the most current medical practices available, unavoidable complications occasionally occur. If you have any  problems or questions after discharge, please call your doctor. HOME CARE INSTRUCTIONS Activity You may resume your regular activity but move at a slower pace for the next 24 hours.  Take frequent rest periods for the next 24 hours.  Walking will help expel (get rid of) the air and reduce the bloated feeling in your abdomen.  No driving for 24 hours (because of the anesthesia (medicine) used during the test).  You may shower.  Do not sign any important legal documents or operate any machinery for 24 hours (because of the anesthesia used  during the test).  Nutrition Drink plenty of fluids.  You may resume your normal diet.  Begin with a light meal and progress to your normal diet.  Avoid alcoholic beverages for 24 hours or as instructed by your caregiver.  Medications You may resume your normal medications unless your caregiver tells you otherwise. What you can expect today You may experience abdominal discomfort such as a feeling of fullness or "gas" pains.  You may experience a sore throat for 2 to 3 days. This is normal. Gargling with salt water may help this.  Follow-up Your doctor will discuss the results of your test with you. SEEK IMMEDIATE MEDICAL CARE IF: You have excessive nausea (feeling sick to your stomach) and/or vomiting.  You have severe abdominal pain and distention (swelling).  You have trouble swallowing.  You have a temperature over 100 F (37.8 C).  You have rectal bleeding or vomiting of blood.  Document Released: 07/26/2004 Document Revised: 12/01/2011 Document Reviewed: 02/06/2008

## 2023-12-06 ENCOUNTER — Ambulatory Visit (HOSPITAL_BASED_OUTPATIENT_CLINIC_OR_DEPARTMENT_OTHER): Payer: Medicare Other | Admitting: Certified Registered Nurse Anesthetist

## 2023-12-06 ENCOUNTER — Encounter (HOSPITAL_COMMUNITY)
Admission: RE | Disposition: A | Discharge status: Home / Self Care | Payer: Self-pay | Source: Home / Self Care | Attending: Internal Medicine

## 2023-12-06 ENCOUNTER — Ambulatory Visit (HOSPITAL_COMMUNITY): Payer: Self-pay | Admitting: Certified Registered Nurse Anesthetist

## 2023-12-06 ENCOUNTER — Ambulatory Visit (HOSPITAL_COMMUNITY)
Admission: RE | Admit: 2023-12-06 | Discharge: 2023-12-06 | Disposition: A | Discharge status: Home / Self Care | Payer: Medicare Other | Attending: Internal Medicine | Admitting: Internal Medicine

## 2023-12-06 ENCOUNTER — Encounter (HOSPITAL_COMMUNITY): Payer: Self-pay | Admitting: Internal Medicine

## 2023-12-06 DIAGNOSIS — K3189 Other diseases of stomach and duodenum: Secondary | ICD-10-CM | POA: Diagnosis not present

## 2023-12-06 DIAGNOSIS — Z87891 Personal history of nicotine dependence: Secondary | ICD-10-CM | POA: Diagnosis not present

## 2023-12-06 DIAGNOSIS — K219 Gastro-esophageal reflux disease without esophagitis: Secondary | ICD-10-CM | POA: Insufficient documentation

## 2023-12-06 DIAGNOSIS — I509 Heart failure, unspecified: Secondary | ICD-10-CM | POA: Diagnosis not present

## 2023-12-06 DIAGNOSIS — K259 Gastric ulcer, unspecified as acute or chronic, without hemorrhage or perforation: Secondary | ICD-10-CM | POA: Diagnosis not present

## 2023-12-06 DIAGNOSIS — K766 Portal hypertension: Secondary | ICD-10-CM | POA: Diagnosis not present

## 2023-12-06 DIAGNOSIS — I110 Hypertensive heart disease with heart failure: Secondary | ICD-10-CM | POA: Diagnosis not present

## 2023-12-06 DIAGNOSIS — E119 Type 2 diabetes mellitus without complications: Secondary | ICD-10-CM | POA: Diagnosis not present

## 2023-12-06 DIAGNOSIS — R1310 Dysphagia, unspecified: Secondary | ICD-10-CM | POA: Diagnosis not present

## 2023-12-06 DIAGNOSIS — K5710 Diverticulosis of small intestine without perforation or abscess without bleeding: Secondary | ICD-10-CM | POA: Diagnosis not present

## 2023-12-06 DIAGNOSIS — R1314 Dysphagia, pharyngoesophageal phase: Secondary | ICD-10-CM | POA: Diagnosis not present

## 2023-12-06 HISTORY — PX: BIOPSY: SHX5522

## 2023-12-06 HISTORY — PX: ESOPHAGOGASTRODUODENOSCOPY (EGD) WITH PROPOFOL: SHX5813

## 2023-12-06 HISTORY — PX: MALONEY DILATION: SHX5535

## 2023-12-06 LAB — GLUCOSE, CAPILLARY: Glucose-Capillary: 162 mg/dL — ABNORMAL HIGH (ref 70–99)

## 2023-12-06 SURGERY — ESOPHAGOGASTRODUODENOSCOPY (EGD) WITH PROPOFOL
Anesthesia: General

## 2023-12-06 MED ORDER — PROPOFOL 10 MG/ML IV BOLUS
INTRAVENOUS | Status: DC | PRN
  Administered 2023-12-06: 150 ug/kg/min via INTRAVENOUS
  Administered 2023-12-06: 80 mg via INTRAVENOUS

## 2023-12-06 MED ORDER — LIDOCAINE HCL (PF) 2 % IJ SOLN
INTRAMUSCULAR | Status: DC | PRN
  Administered 2023-12-06: 50 mg via INTRADERMAL

## 2023-12-06 MED ORDER — LACTATED RINGERS IV SOLN: INTRAVENOUS | Status: DC

## 2023-12-06 MED ORDER — LIDOCAINE HCL (PF) 2 % IJ SOLN
INTRAMUSCULAR | Status: AC
  Filled 2023-12-06: qty 5

## 2023-12-06 NOTE — Discharge Instructions (Signed)
EGD Discharge instructions Please read the instructions outlined below and refer to this sheet in the next few weeks. These discharge instructions provide you with general information on caring for yourself after you leave the hospital. Your doctor may also give you specific instructions. While your treatment has been planned according to the most current medical practices available, unavoidable complications occasionally occur. If you have any problems or questions after discharge, please call your doctor. ACTIVITY You may resume your regular activity but move at a slower pace for the next 24 hours.  Take frequent rest periods for the next 24 hours.  Walking will help expel (get rid of) the air and reduce the bloated feeling in your abdomen.  No driving for 24 hours (because of the anesthesia (medicine) used during the test).  You may shower.  Do not sign any important legal documents or operate any machinery for 24 hours (because of the anesthesia used during the test).  NUTRITION Drink plenty of fluids.  You may resume your normal diet.  Begin with a light meal and progress to your normal diet.  Avoid alcoholic beverages for 24 hours or as instructed by your caregiver.  MEDICATIONS You may resume your normal medications unless your caregiver tells you otherwise.  WHAT YOU CAN EXPECT TODAY You may experience abdominal discomfort such as a feeling of fullness or "gas" pains.  FOLLOW-UP Your doctor will discuss the results of your test with you.  SEEK IMMEDIATE MEDICAL ATTENTION IF ANY OF THE FOLLOWING OCCUR: Excessive nausea (feeling sick to your stomach) and/or vomiting.  Severe abdominal pain and distention (swelling).  Trouble swallowing.  Temperature over 101 F (37.8 C).  Rectal bleeding or vomiting of blood.     your esophagus was stretched today  You have a ulcer in your stomach.  Biopsies taken.  Avoid all NSAIDs like Advil Aleve headache powders like BCs.  Take Protonix  40 mg twice daily every day twice a day best taken 30 minutes before breakfast and supper  Office visit with Tana Coast in 6 weeks  At patient request, called Cherly Beach at [phone] findings and recommendations

## 2023-12-06 NOTE — Op Note (Signed)
[facility] Patient Name: Debra Vasquez Procedure Date: 12/06/2023 9:46 AM MRN: [id number] Date of Birth: Dec 05, 1946 Attending MD: Gennette Pac , MD, [id number] CSN: [id number] Age: 77 Admit Type: Outpatient Procedure:                Upper GI endoscopy Indications:              Dysphagia Providers:                Gennette Pac, MD, Francoise Ceo RN, RN,                            Lennice Sites Technician, Technician Referring MD:              Medicines:                Propofol per Anesthesia Complications:            No immediate complications. Estimated Blood Loss:     Estimated blood loss was minimal. Estimated blood                            loss was minimal. Procedure:                Pre-Anesthesia Assessment:                           - Prior to the procedure, a History and Physical                            was performed, and patient medications and                            allergies were reviewed. The patient's tolerance of                            previous anesthesia was also reviewed. The risks                            and benefits of the procedure and the sedation                            options and risks were discussed with the patient.                            All questions were answered, and informed consent                            was obtained. Prior Anticoagulants: The patient has                            taken no anticoagulant or antiplatelet agents. ASA                            Grade Assessment: III - A patient with severe  systemic disease. After reviewing the risks and                            benefits, the patient was deemed in satisfactory                            condition to undergo the procedure.                           After obtaining informed consent, the endoscope was                            passed under direct vision. Throughout the                            procedure, the  patient's blood pressure, pulse, and                            oxygen saturations were monitored continuously. The                            GIF-H190 ([id number]) scope was introduced through the                            mouth, and advanced to the second part of duodenum.                            The upper GI endoscopy was accomplished without                            difficulty. The patient tolerated the procedure                            well. Scope In: 10:08:32 AM Scope Out: 10:18:23 AM Total Procedure Duration: 0 hours 9 minutes 51 seconds  Findings:      The examined esophagus was normal. .      Moderate portal hypertensive gastropathy was found in the entire       examined stomach. 1 cm stellate shaped ulcer with clean base prepyloric       antral mucosa.      The duodenal bulb and second portion of the duodenum were normal aside       from a 2 cm juxta ampullary duodenal diverticulum. The scope was       withdrawn. Dilation was performed with a Maloney dilator with mild       resistance at 54 Fr. The scope was withdrawn. Dilation was performed       with a Maloney dilator with mild resistance at 56 Fr. The dilation site       was examined following endoscope reinsertion and showed no change.       Biopsies of the ulcer taken. Impression:               - Normal esophagus. Status post dilation.                           -  Portal hypertensive gastropathy. Antral ulcer                            status post biopsy                           - Normal duodenal bulb and second portion of the                            duodenum.                           - Moderate Sedation:      Moderate (conscious) sedation was personally administered by an       anesthesia professional. The following parameters were monitored: oxygen       saturation, heart rate, blood pressure, respiratory rate, EKG, adequacy       of pulmonary ventilation, and response to care. Recommendation:           -  Patient has a contact number available for                            emergencies. The signs and symptoms of potential                            delayed complications were discussed with the                            patient. Return to normal activities tomorrow.                            Written discharge instructions were provided to the                            patient.                           - Advance diet as tolerated.                           - Continue present medications.                           - Return to my office in 6 weeks. Follow-up on                            pathology. Avoid NSAIDs. Procedure Code(s):        --- Professional ---                           [phone], Esophagogastroduodenoscopy, flexible,                            transoral; diagnostic, including collection of                            specimen(s) by brushing or washing, when performed                            (  separate procedure)                           43450, Dilation of esophagus, by unguided sound or                            bougie, single or multiple passes Diagnosis Code(s):        --- Professional ---                           K76.6, Portal hypertension                           K31.89, Other diseases of stomach and duodenum                           R13.10, Dysphagia, unspecified CPT copyright 2022 American [employer] Association. All rights reserved. The codes documented in this report are preliminary and upon coder review may  be revised to meet current compliance requirements. Gerrit Friends. Antwone Capozzoli, MD Gennette Pac, MD 12/06/2023 10:36:27 AM This report has been signed electronically. Number of Addenda: 0

## 2023-12-06 NOTE — Transfer of Care (Signed)
Immediate Anesthesia Transfer of Care Note  Patient: Debra Vasquez  Procedure(s) Performed: ESOPHAGOGASTRODUODENOSCOPY (EGD) WITH PROPOFOL MALONEY DILATION BIOPSY  Patient Location: Short Stay  Anesthesia Type:General  Level of Consciousness: awake, alert , and oriented  Airway & Oxygen Therapy: Patient Spontanous Breathing  Post-op Assessment: Report given to RN, Post -op Vital signs reviewed and stable, Patient moving all extremities X 4, and Patient able to stick tongue midline  Post vital signs: Reviewed and stable  Last Vitals:  Vitals Value Taken Time  BP 107/65 12/06/23 1033  Temp 36.4 C 12/06/23 1025  Pulse 54 12/06/23 1025  Resp 16 12/06/23 1025  SpO2 93 % 12/06/23 1025    Last Pain:  Vitals:   12/06/23 1025  TempSrc:   PainSc: 0-No pain      Patients Stated Pain Goal: (P) 5 (12/06/23 0842)  Complications: No notable events documented.

## 2023-12-06 NOTE — Anesthesia Preprocedure Evaluation (Signed)
Anesthesia Evaluation  Patient identified by MRN, date of birth, ID band Patient awake    Reviewed: Allergy & Precautions, H&P , NPO status , Patient's Chart, lab work & pertinent test results, reviewed documented beta blocker date and time   Airway Mallampati: II  TM Distance: >3 FB Neck ROM: full    Dental no notable dental hx.    Pulmonary neg pulmonary ROS, former smoker   Pulmonary exam normal breath sounds clear to auscultation       Cardiovascular Exercise Tolerance: Good hypertension, negative cardio ROS  Rhythm:regular Rate:Normal     Neuro/Psych negative neurological ROS  negative psych ROS   GI/Hepatic negative GI ROS, Neg liver ROS,,,  Endo/Other  negative endocrine ROSdiabetes    Renal/GU negative Renal ROS  negative genitourinary   Musculoskeletal   Abdominal   Peds  Hematology negative hematology ROS (+)   Anesthesia Other Findings   Reproductive/Obstetrics negative OB ROS                             Anesthesia Physical Anesthesia Plan  ASA: 2  Anesthesia Plan: General   Post-op Pain Management:    Induction:   PONV Risk Score and Plan: Propofol infusion  Airway Management Planned:   Additional Equipment:   Intra-op Plan:   Post-operative Plan:   Informed Consent: I have reviewed the patients History and Physical, chart, labs and discussed the procedure including the risks, benefits and alternatives for the proposed anesthesia with the patient or authorized representative who has indicated his/her understanding and acceptance.     Dental Advisory Given  Plan Discussed with: CRNA  Anesthesia Plan Comments:        Anesthesia Quick Evaluation

## 2023-12-06 NOTE — H&P (Signed)
@LOGO @   Primary Care Physician:  Richardean Chimera, MD Primary Gastroenterologist:  Dr. Jena Gauss  Pre-Procedure History & Physical: HPI:  Debra Vasquez is a 77 y.o. female here for  here for further evaluation of esophageal dysphagia via EGD with esophageal dilation is feasible/appropriate per plan.  Past Medical History:  Diagnosis Date   Arthritis    CHF (congestive heart failure) (HCC)    Diabetes mellitus without complication (HCC)    Dysphagia    Family history of adverse reaction to anesthesia    Mother had problems waking up   GERD (gastroesophageal reflux disease)    Gout    Hypertension    Renal mass, left     Past Surgical History:  Procedure Laterality Date   ABDOMINAL HYSTERECTOMY     CARPAL TUNNEL RELEASE Left    CATARACT EXTRACTION W/PHACO Right 09/13/2019   Procedure: CATARACT EXTRACTION PHACO AND INTRAOCULAR LENS PLACEMENT RIGHT EYE;  Surgeon: Fabio Pierce, MD;  Location: [facility];  Service: Ophthalmology;  Laterality: Right;  right   CATARACT EXTRACTION W/PHACO Left 09/27/2019   Procedure: CATARACT EXTRACTION PHACO AND INTRAOCULAR LENS PLACEMENT LEFT EYE  (CDE: 8.04);  Surgeon: Fabio Pierce, MD;  Location: [facility];  Service: Ophthalmology;  Laterality: Left;   ESOPHAGOGASTRODUODENOSCOPY N/A 11/18/2020   Procedure: ESOPHAGOGASTRODUODENOSCOPY (EGD);  Surgeon: Corbin Ade, MD;  Location: [facility];  Service: Endoscopy;  Laterality: N/A;  7:30am   MALONEY DILATION N/A 11/18/2020   Procedure: Elease Hashimoto DILATION;  Surgeon: Corbin Ade, MD;  Location: [facility];  Service: Endoscopy;  Laterality: N/A;   ROBOTIC ASSITED PARTIAL NEPHRECTOMY Left 12/07/2021   Procedure: XI ROBOTIC ASSITED PARTIAL NEPHRECTOMY;  Surgeon: Malen Gauze, MD;  Location: [facility];  Service: Urology;  Laterality: Left;   TRIGGER FINGER RELEASE Right    TUBAL LIGATION     WISDOM TOOTH EXTRACTION     XI ROBOTIC ASSISTED VENTRAL HERNIA N/A 07/03/2023   Procedure: XI ROBOTIC  ASSISTED VENTRAL HERNIA;  Surgeon: Franky Macho, MD;  Location: [facility];  Service: General;  Laterality: N/A;    Prior to Admission medications   Medication Sig Start Date End Date Taking? Authorizing Provider  allopurinol (ZYLOPRIM) 300 MG tablet Take 300 mg by mouth in the morning.    Yes [provider]  Cholecalciferol (VITAMIN D3) 50 MCG (2000 UT) TABS Take 2,000 Units by mouth daily.   Yes [provider]  cloNIDine (CATAPRES) 0.3 MG tablet Take 0.3 mg by mouth 2 (two) times daily.  07/06/19  Yes [provider]  Cyanocobalamin (B-12) 3000 MCG CAPS Take 3,000 mcg by mouth daily.   Yes [provider]  furosemide (LASIX) 20 MG tablet Take 20 mg by mouth daily as needed for edema.    Yes [provider]  glipiZIDE (GLUCOTROL XL) 5 MG 24 hr tablet Take 5 mg by mouth in the morning.    Yes [provider]  Magnesium Bisglycinate (MAG GLYCINATE PO) Take 500 mg by mouth daily.   Yes [provider]  metFORMIN (GLUCOPHAGE-XR) 500 MG 24 hr tablet Take 500 mg by mouth at bedtime.  06/25/19  Yes [provider]  metoprolol tartrate (LOPRESSOR) 50 MG tablet Take 50 mg by mouth 2 (two) times daily.   Yes [provider]  Multiple Vitamin (MULTIVITAMIN WITH MINERALS) TABS tablet Take 1 tablet by mouth daily.   Yes [provider]  pantoprazole (PROTONIX) 40 MG tablet TAKE 1 TABLET(40 MG) BY MOUTH TWICE DAILY  BEFORE A MEAL Patient taking differently: Take 40 mg by mouth 2 (two) times daily as needed (acid reflux). 05/12/21  Yes Letta Median, PA-C  valsartan-hydrochlorothiazide (DIOVAN-HCT) 320-12.5 MG tablet Take 1 tablet by mouth daily.   Yes [provider]    Allergies as of 10/26/2023 - Review Complete 10/24/2023  Allergen Reaction Noted   Gabapentin Other (See Comments) 12/17/2018   Capoten [captopril] Cough 09/10/2019   Celexa [citalopram hydrobromide]  09/10/2019   Hydrocodone  11/11/2020    Lexapro [escitalopram oxalate]  09/10/2019   Lipitor [atorvastatin calcium] Other (See Comments) 09/13/2019   Tramadol  09/10/2019    Family History  Problem Relation Age of Onset   Hyperlipidemia Mother    Heart attack Mother    Parkinson's disease Father    Stroke Father 68   Hyperlipidemia Sister    Alcoholism Brother    Diabetes Brother    Heart failure Paternal Grandmother    Colon cancer Neg Hx    Gastric cancer Neg Hx    Esophageal cancer Neg Hx    Liver disease Neg Hx    Pancreatic disease Neg Hx     Social History   Socioeconomic History   Marital status: Widowed    Spouse name: Not on file   Number of children: Not on file   Years of education: Not on file   Highest education level: Not on file  Occupational History   Not on file  Tobacco Use   Smoking status: Former    Current packs/day: 0.00    Average packs/day: 0.5 packs/day for 6.0 years (3.0 ttl pk-yrs)    Types: Cigarettes    Start date: 09/10/1979    Quit date: 09/09/1985    Years since quitting: 38.2   Smokeless tobacco: Never  Vaping Use   Vaping status: Never Used  Substance and Sexual Activity   Alcohol use: Not Currently    Comment: no h/o etoh use   Drug use: Never   Sexual activity: Not Currently  Other Topics Concern   Not on file  Social History Narrative   Not on file   Social Determinants of Health   Financial Resource Strain: Not on file  Food Insecurity: Not on file  Transportation Needs: Not on file  Physical Activity: Not on file  Stress: Not on file  Social Connections: Not on file  Intimate Partner Violence: Not on file    Review of Systems: See HPI, otherwise negative ROS  Physical Exam: BP (!) (P) 147/82   Pulse (!) (P) 50   Temp (P) 97.6 F (36.4 C) (Oral)   Resp (P) 18   SpO2 (P) 98%  General:   Alert,  Well-developed, well-nourished, pleasant and cooperative in NAD Neck:  Supple; no masses or thyromegaly. No significant cervical adenopathy. Lungs:  Clear  throughout to auscultation.   No wheezes, crackles, or rhonchi. No acute distress. Heart:  Regular rate and rhythm; no murmurs, clicks, rubs,  or gallops. Abdomen: Non-distended, normal bowel sounds.  Soft and nontender without appreciable mass or hepatosplenomegaly.   Impression/Plan:    77 year old lady with esophageal dysphagia.  Here for EGD with possible esophageal dilation is feasible/appropriate per plan.    The risks, benefits, limitations, alternatives and imponderables have been reviewed with the patient. Potential for esophageal dilation, biopsy, etc. have also been reviewed.  Questions have been answered. All parties agreeable.

## 2023-12-07 LAB — SURGICAL PATHOLOGY

## 2023-12-08 ENCOUNTER — Encounter: Payer: Self-pay | Admitting: Internal Medicine

## 2023-12-10 NOTE — Anesthesia Postprocedure Evaluation (Signed)
Anesthesia Post Note  Patient: Debra Vasquez  Procedure(s) Performed: ESOPHAGOGASTRODUODENOSCOPY (EGD) WITH PROPOFOL MALONEY DILATION BIOPSY  Patient location during evaluation: Phase II Anesthesia Type: General Level of consciousness: awake Pain management: pain level controlled Vital Signs Assessment: post-procedure vital signs reviewed and stable Respiratory status: spontaneous breathing and respiratory function stable Cardiovascular status: blood pressure returned to baseline and stable Postop Assessment: no headache and no apparent nausea or vomiting Anesthetic complications: no Comments: Late entry   No notable events documented.   Last Vitals:  Vitals:   12/06/23 1025 12/06/23 1033  BP: (!) 73/37 107/65  Pulse: (!) 54   Resp: 16   Temp: 36.4 C   SpO2: 93%     Last Pain:  Vitals:   12/07/23 1459  TempSrc:   PainSc: 0-No pain                 Windell Norfolk

## 2023-12-12 ENCOUNTER — Encounter: Payer: Self-pay | Admitting: Urology

## 2023-12-12 NOTE — Progress Notes (Signed)
Spoke with Mrs. Lich this evening about radiology read of her imaging from 11/29/2023. > Chest x-ray: No acute findings / no evidence of active oncologic disease. > CT abdomen/pelvis w/wo contrast: - No acute urologic findings.  - Concerning hepatic finding: "Since the prior study, there are innumerable, subcentimeter sized, ill-defined, hypoattenuating lesions throughout the liver, highly concerning for metastases."   She underwent an endoscopic GI procedure on 12/06/2023 and is currently scheduled for 6 week follow up with GI on 01/17/2024. I advised her that GI will address the abnormal hepatic CT findings and sent a high priority message to Dr. Kendell Bane and her other GI providers.   Pt verbalized understanding. All questions were answered to the best of my ability. Will keep urology follow up as scheduled in June 2025 with Dr. Ronne Binning.  Evette Georges, MSN, FNP-C, [facility] Urology Nurse Practitioner [facility] [place]

## 2023-12-13 ENCOUNTER — Encounter (HOSPITAL_COMMUNITY): Payer: Self-pay | Admitting: Internal Medicine

## 2023-12-13 ENCOUNTER — Other Ambulatory Visit: Payer: Self-pay | Admitting: Urology

## 2023-12-13 DIAGNOSIS — Z905 Acquired absence of kidney: Secondary | ICD-10-CM

## 2023-12-13 DIAGNOSIS — K769 Liver disease, unspecified: Secondary | ICD-10-CM

## 2023-12-13 DIAGNOSIS — Z23 Encounter for immunization: Secondary | ICD-10-CM | POA: Diagnosis not present

## 2023-12-13 DIAGNOSIS — C642 Malignant neoplasm of left kidney, except renal pelvis: Secondary | ICD-10-CM

## 2023-12-13 DIAGNOSIS — R932 Abnormal findings on diagnostic imaging of liver and biliary tract: Secondary | ICD-10-CM

## 2023-12-13 NOTE — Progress Notes (Signed)
Addendum: - Per message from Tana Coast, PA in GI: "Dr. Jena Gauss looked at CT images and is very concerned about possibility of liver mets. He is recommending that you arrange for oncology consultation asap. Oncology will decide appropriate imaging or if biopsy needed etc".  - I placed urgent referral to medical oncology today for CT findings concerning for possible new liver malignancy in patient with history of left RCC s/p partial nephrectomy.  Evette Georges, MSN, FNP-C, [facility] Urology Nurse Practitioner [facility] [place]

## 2023-12-15 ENCOUNTER — Telehealth (INDEPENDENT_AMBULATORY_CARE_PROVIDER_SITE_OTHER): Payer: Self-pay | Admitting: *Deleted

## 2023-12-15 ENCOUNTER — Other Ambulatory Visit (INDEPENDENT_AMBULATORY_CARE_PROVIDER_SITE_OTHER): Payer: Self-pay | Admitting: *Deleted

## 2023-12-15 NOTE — Progress Notes (Unsigned)
Patient has questions about a letter she received.   [phone]

## 2023-12-15 NOTE — Telephone Encounter (Signed)
Patient has questions about a letter with results she received   [phone]

## 2023-12-15 NOTE — Telephone Encounter (Signed)
Spoke with pt and let her know what the recommendations of the letter were.

## 2023-12-21 ENCOUNTER — Telehealth: Payer: Self-pay | Admitting: Gastroenterology

## 2023-12-21 NOTE — Telephone Encounter (Signed)
Urology, Evette Georges, FNP reached out last week regarding CT findings concerning for liver metastases. Discussed with Dr. Jena Gauss as well. We advised urgent oncology evaluation. She has pending referral but I do not see appt pending. Can we please reach out and make sure this patient gets urgent ov?

## 2023-12-22 ENCOUNTER — Encounter: Payer: Self-pay | Admitting: *Deleted

## 2023-12-22 ENCOUNTER — Other Ambulatory Visit: Payer: Self-pay | Admitting: *Deleted

## 2023-12-22 DIAGNOSIS — C642 Malignant neoplasm of left kidney, except renal pelvis: Secondary | ICD-10-CM

## 2023-12-22 DIAGNOSIS — R932 Abnormal findings on diagnostic imaging of liver and biliary tract: Secondary | ICD-10-CM

## 2023-12-22 NOTE — Telephone Encounter (Signed)
Tiffany left message stating that the referral for the pt had no location and that's why it wasn't being seen on their end.

## 2023-12-22 NOTE — Addendum Note (Signed)
Addended by: Elinor Dodge on: 12/22/2023 08:32 AM   Modules accepted: Orders

## 2023-12-22 NOTE — Progress Notes (Signed)
Patient with a history of renal cell, s/p nephrectomy on 12/07/2021. Recent surveillance CT shows concern for liver mets.   Spoke with patient but she states that she knows nothing about the CT showing any concerning findings. I reviewed the note of the FNP call to patient on 12/17 but patient insists she was told everything was fine. She refuses to schedule an appointment until she speaks with urology first.   Sent message requesting that they call patient. Staff from patient's GI office reached out to the patient. After speaking to them she is comfortable scheduling.   Reached out to [place] to introduce myself as the office Heritage manager and explain our new patient process. Reviewed the reason for their referral and scheduled their new patient appointment along with labs. Provided address and directions to the office including call back phone number. Reviewed with patient any concerns they may have or any possible barriers to attending their appointment.   Oncology Nurse Navigator Documentation     12/22/2023    8:00 AM  Oncology Nurse Navigator Flowsheets  Abnormal Finding Date 11/29/2023  Diagnosis Status Additional Work Up  Navigator Follow Up Date: 12/25/2023  Navigator Follow Up Reason: New Patient Appointment  Navigator Location [facility]  Referral Date to RadOnc/MedOnc 12/22/2023  Navigator Encounter Type Introductory Phone Call  Patient Visit Type MedOnc  Treatment Phase Abnormal Scans  Barriers/Navigation Needs Coordination of Care;Education  Education Other  Interventions Coordination of Care;Education  Acuity Level 2-Minimal Needs (1-2 Barriers Identified)  Coordination of Care Appts;Other  Education Method Verbal  Time Spent with Patient 45

## 2023-12-22 NOTE — Telephone Encounter (Signed)
Gwendel Hanson, RN from oncology office reached out regarding referral not having a specific site . New referral placed per providers request. Pt has been called by oncology office to set up appointement.

## 2023-12-22 NOTE — Telephone Encounter (Signed)
Spoke with Tiffany at [facility] at [facility] about referral for pt. She states an appointment hasn't been made and will send it over to [facility] to get pt scheduled. Tiffany will call back with an update.

## 2023-12-25 ENCOUNTER — Encounter: Payer: Self-pay | Admitting: *Deleted

## 2023-12-25 ENCOUNTER — Inpatient Hospital Stay (HOSPITAL_BASED_OUTPATIENT_CLINIC_OR_DEPARTMENT_OTHER): Payer: Medicare Other | Admitting: Hematology & Oncology

## 2023-12-25 ENCOUNTER — Inpatient Hospital Stay: Payer: Medicare Other | Attending: Hematology & Oncology

## 2023-12-25 ENCOUNTER — Encounter: Payer: Self-pay | Admitting: Hematology & Oncology

## 2023-12-25 ENCOUNTER — Other Ambulatory Visit: Payer: Self-pay

## 2023-12-25 VITALS — Ht 65.0 in | Wt 158.0 lb

## 2023-12-25 DIAGNOSIS — Z888 Allergy status to other drugs, medicaments and biological substances status: Secondary | ICD-10-CM | POA: Insufficient documentation

## 2023-12-25 DIAGNOSIS — Z818 Family history of other mental and behavioral disorders: Secondary | ICD-10-CM | POA: Insufficient documentation

## 2023-12-25 DIAGNOSIS — I110 Hypertensive heart disease with heart failure: Secondary | ICD-10-CM | POA: Insufficient documentation

## 2023-12-25 DIAGNOSIS — Z90710 Acquired absence of both cervix and uterus: Secondary | ICD-10-CM | POA: Insufficient documentation

## 2023-12-25 DIAGNOSIS — Z8249 Family history of ischemic heart disease and other diseases of the circulatory system: Secondary | ICD-10-CM | POA: Diagnosis not present

## 2023-12-25 DIAGNOSIS — K760 Fatty (change of) liver, not elsewhere classified: Secondary | ICD-10-CM | POA: Diagnosis not present

## 2023-12-25 DIAGNOSIS — E119 Type 2 diabetes mellitus without complications: Secondary | ICD-10-CM | POA: Diagnosis not present

## 2023-12-25 DIAGNOSIS — Z87891 Personal history of nicotine dependence: Secondary | ICD-10-CM

## 2023-12-25 DIAGNOSIS — Z823 Family history of stroke: Secondary | ICD-10-CM | POA: Insufficient documentation

## 2023-12-25 DIAGNOSIS — Z833 Family history of diabetes mellitus: Secondary | ICD-10-CM

## 2023-12-25 DIAGNOSIS — K219 Gastro-esophageal reflux disease without esophagitis: Secondary | ICD-10-CM | POA: Insufficient documentation

## 2023-12-25 DIAGNOSIS — I509 Heart failure, unspecified: Secondary | ICD-10-CM | POA: Insufficient documentation

## 2023-12-25 DIAGNOSIS — Z885 Allergy status to narcotic agent status: Secondary | ICD-10-CM | POA: Diagnosis not present

## 2023-12-25 DIAGNOSIS — Z905 Acquired absence of kidney: Secondary | ICD-10-CM | POA: Diagnosis not present

## 2023-12-25 DIAGNOSIS — R7303 Prediabetes: Secondary | ICD-10-CM

## 2023-12-25 DIAGNOSIS — Z811 Family history of alcohol abuse and dependence: Secondary | ICD-10-CM | POA: Diagnosis not present

## 2023-12-25 DIAGNOSIS — Z79899 Other long term (current) drug therapy: Secondary | ICD-10-CM | POA: Diagnosis not present

## 2023-12-25 DIAGNOSIS — C642 Malignant neoplasm of left kidney, except renal pelvis: Secondary | ICD-10-CM | POA: Diagnosis not present

## 2023-12-25 DIAGNOSIS — Z83438 Family history of other disorder of lipoprotein metabolism and other lipidemia: Secondary | ICD-10-CM

## 2023-12-25 LAB — CBC WITH DIFFERENTIAL (CANCER CENTER ONLY)
Abs Immature Granulocytes: 0.02 10*3/uL (ref 0.00–0.07)
Basophils Absolute: 0.1 10*3/uL (ref 0.0–0.1)
Basophils Relative: 1 %
Eosinophils Absolute: 0.1 10*3/uL (ref 0.0–0.5)
Eosinophils Relative: 2 %
HCT: 35.7 % — ABNORMAL LOW (ref 36.0–46.0)
Hemoglobin: 12 g/dL (ref 12.0–15.0)
Immature Granulocytes: 0 %
Lymphocytes Relative: 32 %
Lymphs Abs: 2.2 10*3/uL (ref 0.7–4.0)
MCH: 28.2 pg (ref 26.0–34.0)
MCHC: 33.6 g/dL (ref 30.0–36.0)
MCV: 84 fL (ref 80.0–100.0)
Monocytes Absolute: 0.4 10*3/uL (ref 0.1–1.0)
Monocytes Relative: 6 %
Neutro Abs: 4.1 10*3/uL (ref 1.7–7.7)
Neutrophils Relative %: 59 %
Platelet Count: 117 10*3/uL — ABNORMAL LOW (ref 150–400)
RBC: 4.25 MIL/uL (ref 3.87–5.11)
RDW: 14.4 % (ref 11.5–15.5)
WBC Count: 7 10*3/uL (ref 4.0–10.5)
nRBC: 0 % (ref 0.0–0.2)

## 2023-12-25 LAB — CMP (CANCER CENTER ONLY)
ALT: 25 U/L (ref 0–44)
AST: 39 U/L (ref 15–41)
Albumin: 4.1 g/dL (ref 3.5–5.0)
Alkaline Phosphatase: 82 U/L (ref 38–126)
Anion gap: 9 (ref 5–15)
BUN: 28 mg/dL — ABNORMAL HIGH (ref 8–23)
CO2: 31 mmol/L (ref 22–32)
Calcium: 9.7 mg/dL (ref 8.9–10.3)
Chloride: 101 mmol/L (ref 98–111)
Creatinine: 1.41 mg/dL — ABNORMAL HIGH (ref 0.44–1.00)
GFR, Estimated: 39 mL/min — ABNORMAL LOW (ref >60)
Glucose, Bld: 156 mg/dL — ABNORMAL HIGH (ref 70–99)
Potassium: 4.2 mmol/L (ref 3.5–5.1)
Sodium: 141 mmol/L (ref 135–145)
Total Bilirubin: 0.7 mg/dL (ref 0.0–1.2)
Total Protein: 6.3 g/dL — ABNORMAL LOW (ref 6.5–8.1)

## 2023-12-25 LAB — LACTATE DEHYDROGENASE: LDH: 153 U/L (ref 98–192)

## 2023-12-25 LAB — PREALBUMIN: Prealbumin: 23 mg/dL (ref 18–38)

## 2023-12-25 LAB — TSH: TSH: 2.594 u[IU]/mL (ref 0.350–4.500)

## 2023-12-25 NOTE — Progress Notes (Signed)
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  HPI: Debra Vasquez is a very charming 77 year old white female.  She does have a history of CHF.  She has history of diabetes.  She apparently presented with a mass in the left kidney.  This was back in 2022.  She underwent a partial nephrectomy.  This was on 12/07/2021.  The pathology report ([id number]) showed a 4.7 cm papillary renal cell carcinoma.  There is extension into the renal sinus fat.  All margins were negative.  No lymph nodes were taken.  She had surgery by Dr. Ronne Binning of [facility].   She has been getting close follow-up.  She had a routine CT scan that was done on 11/29/2023.  Surprisingly, there is the report of numerous subcentimeter lesions in the liver.  It was felt that these were "suspicious" for metastatic disease.  She feels well.  She has no abdominal pain.  She has had no problems with bowels or bladder.  She has had no weight loss or weight gain.  She has had no issues with cough or shortness of breath.  She has had no bleeding.  There is been no fever.  She has had no leg swelling.  She used to smoke but stopped about 40 years ago.  She really has no alcohol use.  She does have a "fatty liver".  This temperature is probably from diabetes.  She was kindly referred to the [facility] because of the CT report.  She has had no headache.  She has had no bony pain.  She has had no rashes.  Overall, I would say that her performance status is probably ECOG 1.      Past Medical History:  Diagnosis Date   Arthritis    CHF (congestive heart failure) (HCC)    Diabetes mellitus without complication (HCC)    Dysphagia    Family history of adverse reaction to anesthesia    Mother had problems waking up   GERD  (gastroesophageal reflux disease)    Gout    Hypertension    Renal mass, left   :   Past Surgical History:  Procedure Laterality Date   ABDOMINAL HYSTERECTOMY     BIOPSY  12/06/2023   Procedure: BIOPSY;  Surgeon: Corbin Ade, MD;  [facility];  Service: Endoscopy;;   CARPAL TUNNEL RELEASE Left    CATARACT EXTRACTION W/PHACO Right 09/13/2019   Procedure: CATARACT EXTRACTION PHACO AND INTRAOCULAR LENS PLACEMENT RIGHT EYE;  Surgeon: Fabio Pierce, MD;  Location: [facility];  Service: Ophthalmology;  Laterality: Right;  right   CATARACT EXTRACTION W/PHACO Left 09/27/2019   Procedure: CATARACT EXTRACTION PHACO AND INTRAOCULAR LENS PLACEMENT LEFT EYE  (CDE: 8.04);  Surgeon: Fabio Pierce, MD;  Location: [facility];  Service: Ophthalmology;  Laterality: Left;   ESOPHAGOGASTRODUODENOSCOPY N/A 11/18/2020   Procedure: ESOPHAGOGASTRODUODENOSCOPY (EGD);  Surgeon: Corbin Ade, MD;  [facility];  Service: Endoscopy;  Laterality: N/A;  7:30am   ESOPHAGOGASTRODUODENOSCOPY (EGD) WITH PROPOFOL N/A 12/06/2023   Procedure: ESOPHAGOGASTRODUODENOSCOPY (EGD) WITH PROPOFOL;  Surgeon: Corbin Ade, MD;  [facility];  Service: Endoscopy;  Laterality: N/A;  945am, asa 3   MALONEY DILATION N/A 11/18/2020   Procedure: MALONEY DILATION;  Surgeon: Corbin Ade, MD;  Location: [facility];  Service: Endoscopy;  Laterality: N/A;   MALONEY DILATION  12/06/2023   Procedure: Elease Hashimoto DILATION;  Surgeon: Corbin Ade, MD;  Location: [facility];  Service: Endoscopy;;   ROBOTIC ASSITED PARTIAL NEPHRECTOMY Left 12/07/2021   Procedure: XI ROBOTIC ASSITED PARTIAL NEPHRECTOMY;  Surgeon: Malen Gauze, MD;  Location: [facility];  Service: Urology;  Laterality: Left;   TRIGGER FINGER RELEASE Right    TUBAL LIGATION     WISDOM TOOTH EXTRACTION     XI ROBOTIC ASSISTED VENTRAL HERNIA N/A 07/03/2023   Procedure: XI ROBOTIC ASSISTED VENTRAL HERNIA;  Surgeon: Franky Macho, MD;   Location: [facility];  Service: General;  Laterality: N/A;  :   Current Outpatient Medications:    ABRYSVO 120 MCG/0.5ML injection, , Disp: , Rfl:    allopurinol (ZYLOPRIM) 300 MG tablet, Take 300 mg by mouth in the morning. , Disp: , Rfl:    Cholecalciferol (VITAMIN D3) 50 MCG (2000 UT) TABS, Take 2,000 Units by mouth daily., Disp: , Rfl:    cloNIDine (CATAPRES) 0.3 MG tablet, Take 0.3 mg by mouth 2 (two) times daily. , Disp: , Rfl:    Cyanocobalamin (B-12) 3000 MCG CAPS, Take 3,000 mcg by mouth daily., Disp: , Rfl:    furosemide (LASIX) 20 MG tablet, Take 20 mg by mouth daily as needed for edema. , Disp: , Rfl:    glipiZIDE (GLUCOTROL XL) 5 MG 24 hr tablet, Take 5 mg by mouth in the morning. , Disp: , Rfl:    Magnesium Bisglycinate (MAG GLYCINATE PO), Take 500 mg by mouth daily., Disp: , Rfl:    metFORMIN (GLUCOPHAGE-XR) 500 MG 24 hr tablet, Take 500 mg by mouth at bedtime. , Disp: , Rfl:    metoprolol tartrate (LOPRESSOR) 50 MG tablet, Take 50 mg by mouth 2 (two) times daily., Disp: , Rfl:    Multiple Vitamin (MULTIVITAMIN WITH MINERALS) TABS tablet, Take 1 tablet by mouth daily., Disp: , Rfl:    pantoprazole (PROTONIX) 40 MG tablet, TAKE 1 TABLET(40 MG) BY MOUTH TWICE DAILY BEFORE A MEAL (Patient taking differently: Take 40 mg by mouth 2 (two) times daily as needed (acid reflux).), Disp: 60 tablet, Rfl: 5   valsartan-hydrochlorothiazide (DIOVAN-HCT) 320-12.5 MG tablet, Take 1 tablet by mouth daily., Disp: , Rfl: :  :   Allergies  Allergen Reactions   Gabapentin Other (See Comments)    syncope   Capoten [Captopril] Cough   Celexa [Citalopram Hydrobromide]     Passed out   Hydrocodone     "awake all night", felt terrible   Lexapro [Escitalopram Oxalate]     "see psychodelic lights"   Lipitor [Atorvastatin Calcium] Other (See Comments)    Achy legs   Tramadol     confusion  :   Family History  Problem Relation Age of Onset   Hyperlipidemia Mother    Heart attack Mother     Parkinson's disease Father    Stroke Father 8   Hyperlipidemia Sister    Alcoholism Brother    Diabetes Brother    Heart failure Paternal Grandmother    Colon cancer Neg Hx    Gastric cancer Neg Hx    Esophageal cancer Neg Hx    Liver disease Neg Hx    Pancreatic disease Neg Hx   :   Social History   Socioeconomic History   Marital status: Widowed    Spouse name: Not on file   Number of children: Not on file   Years of  education: Not on file   Highest education level: Not on file  Occupational History   Not on file  Tobacco Use   Smoking status: Former    Current packs/day: 0.00    Average packs/day: 0.5 packs/day for 6.0 years (3.0 ttl pk-yrs)    Types: Cigarettes    Start date: 09/10/1979    Quit date: 09/09/1985    Years since quitting: 38.3   Smokeless tobacco: Never  Vaping Use   Vaping status: Never Used  Substance and Sexual Activity   Alcohol use: Not Currently    Comment: no h/o etoh use   Drug use: Never   Sexual activity: Not Currently  Other Topics Concern   Not on file  Social History Narrative   Not on file   Social Drivers of Health   Financial Resource Strain: Not on file  Food Insecurity: Not on file  Transportation Needs: Not on file  Physical Activity: Not on file  Stress: Not on file  Social Connections: Not on file  Intimate Partner Violence: Not on file  :  Review of Systems  Constitutional: Negative.   HENT: Negative.    Eyes: Negative.   Respiratory: Negative.    Cardiovascular: Negative.   Gastrointestinal: Negative.   Genitourinary: Negative.   Musculoskeletal: Negative.   Skin: Negative.   Neurological: Negative.   Endo/Heme/Allergies: Negative.   Psychiatric/Behavioral: Negative.       Exam: Vital signs show temperature of 97.3.  Pulse 53.  Blood pressure 152/70.  Weight is 158 pounds.  @IPVITALS @ Physical Exam Vitals reviewed.  HENT:     Head: Normocephalic and atraumatic.  Eyes:     Pupils: Pupils are equal,  round, and reactive to light.  Cardiovascular:     Rate and Rhythm: Normal rate and regular rhythm.     Heart sounds: Normal heart sounds.  Pulmonary:     Effort: Pulmonary effort is normal.     Breath sounds: Normal breath sounds.  Abdominal:     General: Bowel sounds are normal.     Palpations: Abdomen is soft.     Comments: Abdominal exam shows well-healed laparoscopy scars.  There is no fluid wave.  There is no palpable hepatomegaly.  There is no guarding or rebound tenderness.  There is no adenopathy in the inguinal region.  Musculoskeletal:        General: No tenderness or deformity. Normal range of motion.     Cervical back: Normal range of motion.  Lymphadenopathy:     Cervical: No cervical adenopathy.  Skin:    General: Skin is warm and dry.     Findings: No erythema or rash.  Neurological:     Mental Status: She is alert and oriented to person, place, and time.  Psychiatric:        Behavior: Behavior normal.        Thought Content: Thought content normal.        Judgment: Judgment normal.     Recent Labs    12/25/23 1041  WBC 7.0  HGB 12.0  HCT 35.7*  PLT 117*    Recent Labs    12/25/23 1041  NA 141  K 4.2  CL 101  CO2 31  GLUCOSE 156*  BUN 28*  CREATININE 1.41*  CALCIUM 9.7    Blood smear review: None  Pathology: See above    Assessment and Plan: Debra Vasquez is a very nice 77 year old white female.  She has history of a stage III renal cell  carcinoma.  This was resected almost 3 years ago.  I am certainly not convinced that she has metastatic disease to the liver.  The CT scan report certainly is not conclusive in my opinion.  I really think we need to do an MRI of the liver.  We will see what the MRI shows.  If there is something on the MRI that looks suspicious, then we are going to have to get a biopsy.  I am talked to her about the CT report.  I gave my recommendations.  She certainly is willing to do the MRI.  She lives up in [place] so we will see about getting the MRI [facility].  Once I have the report, then I will be back in touch with her.  We will then figure out what needs to be done.  She is very nice.  I enjoyed talking with her.  Hopefully, everything will turn out okay.

## 2023-12-25 NOTE — Progress Notes (Signed)
Initial RN Navigator Patient Visit  Name: Debra Vasquez Date of Referral : 12/22/2023 Diagnosis: Liver masses. H/o renal cell  Met with patient prior to their visit with MD. Jovita Gamma patient "Your Patient Navigator" handout which explains my role, areas in which I am able to help, and all the contact information for myself and the office. Also gave patient MD and Navigator business card. Reviewed with patient the general overview of expected course after initial diagnosis and time frame for all steps to be completed.  Patient come alone and is extremely nervous. She is retired and lives on her own. She has a brother that lives a few doors down. Her son also lives near by. The patient considers both helpful and supportive.   Patient completed visit with Dr. Myna Hidalgo. Once orders placed and note dictated will proceed with scheduling.   Patient understands all follow up procedures and expectations. They have my number to reach out for any further clarification or additional needs.   Oncology Nurse Navigator Documentation     12/25/2023   11:00 AM  Oncology Nurse Navigator Flowsheets  Navigator Follow Up Date: 12/28/2023  Navigator Follow Up Reason: Appointment Review  Navigator Location [facility]  Navigator Encounter Type Initial MedOnc  Patient Visit Type MedOnc  Treatment Phase Abnormal Scans  Barriers/Navigation Needs Coordination of Care;Education  Education Other  Interventions Education;Psycho-Social Support  Acuity Level 2-Minimal Needs (1-2 Barriers Identified)  Education Method Verbal;Written  Support Groups/Services Friends and Family  Time Spent with Patient 30

## 2023-12-28 ENCOUNTER — Encounter: Payer: Self-pay | Admitting: *Deleted

## 2023-12-28 NOTE — Progress Notes (Signed)
 Patient's MRI auth'd but not yet scheduled. Called and scheduled for 01/04/2024.  Called and spoke to patient. She is aware of the following: MRI at [facility] January 9th with 12:30pm arrival Nothing to eat or drink after 9am.   She is aware that we will follow up with her once we received MRI results.   Oncology Nurse Navigator Documentation     12/28/2023    1:15 PM  Oncology Nurse Navigator Flowsheets  Navigator Follow Up Date: 01/04/2024  Navigator Follow Up Reason: Scan Review  Navigator Location [facility]  Navigator Encounter Type Telephone;Appt/Treatment Plan Review  Telephone Outgoing Call  Patient Visit Type MedOnc  Treatment Phase Abnormal Scans  Barriers/Navigation Needs Coordination of Care;Education  Education Other  Interventions Coordination of Care;Education  Acuity Level 2-Minimal Needs (1-2 Barriers Identified)  Coordination of Care Radiology  Education Method Verbal;Teach-back  Support Groups/Services Friends and Family  Time Spent with Patient 30

## 2024-01-04 ENCOUNTER — Ambulatory Visit (HOSPITAL_COMMUNITY)
Admission: RE | Admit: 2024-01-04 | Discharge: 2024-01-04 | Disposition: A | Discharge status: Home / Self Care | Payer: Medicare Other | Source: Ambulatory Visit | Attending: Hematology & Oncology | Admitting: Hematology & Oncology

## 2024-01-04 DIAGNOSIS — K769 Liver disease, unspecified: Secondary | ICD-10-CM | POA: Diagnosis not present

## 2024-01-04 DIAGNOSIS — R161 Splenomegaly, not elsewhere classified: Secondary | ICD-10-CM | POA: Diagnosis not present

## 2024-01-04 DIAGNOSIS — R935 Abnormal findings on diagnostic imaging of other abdominal regions, including retroperitoneum: Secondary | ICD-10-CM | POA: Diagnosis not present

## 2024-01-04 DIAGNOSIS — C642 Malignant neoplasm of left kidney, except renal pelvis: Secondary | ICD-10-CM | POA: Insufficient documentation

## 2024-01-04 DIAGNOSIS — Z905 Acquired absence of kidney: Secondary | ICD-10-CM | POA: Diagnosis not present

## 2024-01-04 MED ORDER — GADOBUTROL 1 MMOL/ML IV SOLN
7.0000 mL | Freq: Once | INTRAVENOUS | Status: AC | PRN
  Administered 2024-01-04: 7 mL via INTRAVENOUS

## 2024-01-16 ENCOUNTER — Encounter: Payer: Self-pay | Admitting: *Deleted

## 2024-01-16 NOTE — Progress Notes (Signed)
Called and spoke to patient about her MRI. She was extremely relieved to get results. She will follow up with her PCP and GI doc.   Oncology Nurse Navigator Documentation     01/16/2024   12:45 PM  Oncology Nurse Navigator Flowsheets  Navigation Complete Date: 01/16/2024  Post Navigation: Continue to Follow Patient? No  Reason Not Navigating Patient: No Cancer Diagnosis  Navigator Location [facility] Encounter Type Telephone  Telephone Outgoing Call  Patient Visit Type MedOnc  Treatment Phase Abnormal Scans  Barriers/Navigation Needs Education  Education Other  Interventions Education;Psycho-Social Support  Acuity Level 1-No Barriers  Education Method Verbal  Support Groups/Services Friends and Family  Time Spent with Patient 15

## 2024-01-17 ENCOUNTER — Telehealth: Payer: Self-pay | Admitting: Gastroenterology

## 2024-01-17 ENCOUNTER — Encounter: Payer: Self-pay | Admitting: Gastroenterology

## 2024-01-17 ENCOUNTER — Ambulatory Visit (INDEPENDENT_AMBULATORY_CARE_PROVIDER_SITE_OTHER): Payer: Medicare Other | Admitting: Gastroenterology

## 2024-01-17 VITALS — BP 139/76 | HR 57 | Temp 97.5°F | Ht 65.0 in | Wt 162.8 lb

## 2024-01-17 DIAGNOSIS — K219 Gastro-esophageal reflux disease without esophagitis: Secondary | ICD-10-CM | POA: Diagnosis not present

## 2024-01-17 DIAGNOSIS — K769 Liver disease, unspecified: Secondary | ICD-10-CM

## 2024-01-17 DIAGNOSIS — K7460 Unspecified cirrhosis of liver: Secondary | ICD-10-CM | POA: Diagnosis not present

## 2024-01-17 DIAGNOSIS — K869 Disease of pancreas, unspecified: Secondary | ICD-10-CM

## 2024-01-17 DIAGNOSIS — N6310 Unspecified lump in the right breast, unspecified quadrant: Secondary | ICD-10-CM

## 2024-01-17 DIAGNOSIS — K3189 Other diseases of stomach and duodenum: Secondary | ICD-10-CM | POA: Diagnosis not present

## 2024-01-17 DIAGNOSIS — K259 Gastric ulcer, unspecified as acute or chronic, without hemorrhage or perforation: Secondary | ICD-10-CM | POA: Diagnosis not present

## 2024-01-17 NOTE — Progress Notes (Signed)
GI Office Note    Referring Provider: Richardean Chimera, MD Primary Care Physician:  Richardean Chimera, MD  Primary Gastroenterologist: Roetta Sessions, MD   Chief Complaint   Chief Complaint  Patient presents with   Follow-up    History of Present Illness   Debra Vasquez is a 78 y.o. female presenting today for follow up. Seen 09/2023. H/o GERD, dysphagia, cirrhosis, concern for liver and pancreatic lesion on prior imaging.   Since her last ov she comleted EGD, CT A/P with and without contrast as outlined below. CT concerning for liver metastases therefore she was seen by oncology, Dr. Myna Hidalgo, and MRI liver obtained which fortunately was not c/w metastases. She does have liver lesions that need to be monitored.   Overall doing ok. Swallowing better after EGD/ED. Pills no longer getting stuck. No abdominal pain. Some indigestion. BM regular. No melena, brbpr. Trying to eat better. Gave up [employer] before Christmas. No weight loss. Taking her pantoprazole BID since her EGD. Before that she would take pantoprazole only when she felt she needed it for acid reflux. In process of completing Hep A/B vaccines.   CT Abd with and without contrast 11/2023: IMPRESSION: *Since the prior study, there are innumerable, subcentimeter sized, ill-defined, hypoattenuating lesions throughout the liver, highly concerning for metastases. *No other metastatic renal carcinoma seen within the abdomen or pelvis. *Indeterminate but stable 2.0 x 2.9 cm solid lesion in the inferior right breast. Correlate clinically. *Multiple other nonacute observations, as described above. *stable right hepatic dome fluid attenuation structure.  *stable 6mm arterial hyperenchancing lesion in left hepatic lobe, segment 4B, possible flash filling hemangioma.   MRI liver with and without contrast 12/2023: IMPRESSION: 1. No evidence of liver metastatic disease. Reported hypoenhancing liver lesions on portal venous phase  of prior CT most likely reflect parenchymal fibrosis and nodularity in the setting of cirrhosis. 2. Hypoenhancing lesion of the posterior liver dome, hepatic segment VII measuring 0.8 cm, unchanged compared to prior MR dated 2022. This remains a LI-RADS category 3 lesion in the setting of cirrhosis, established stability reassuring for benign nature. 3. Unchanged arterially hyperenhancing lesion of the anterior inferior left lobe of the liver, hepatic segment III, measuring 0.6 cm, without washout or capsular enhancement. This remains a LI-RADS category 3 lesion in the setting of cirrhosis, established stability reassuring for benign nature. 4. Mild splenomegaly. 5. Unchanged appearance of partial superior pole left nephrectomy. No suspicious soft tissue or contrast enhancement. 6. Mucosal thickening and hyperenhancement of the pylorus and duodenal bulb, consistent with gastritis and duodenitis.   Prior data: CT abdomen with and without contrast Apr 27, 2023: IMPRESSION: Postsurgical changes related to left upper pole partial nephrectomy. No evidence of recurrent or metastatic disease. Cirrhosis. Mild splenomegaly. No abdominal ascites.   CT Abd with and without contrast 02/2022: IMPRESSION: 1. Evolving changes of partial left nephrectomy without evidence of recurrent or metastatic disease. 2. Cirrhosis. 6 mm arterial phase enhancing nodule in the left hepatic lobe, worrisome for HCC. Consider MR abdomen without and with contrast in further evaluation, as clinically indicated. These results will be called to the ordering clinician or representative by the Radiologist Assistant, and communication documented in the PACS or [employer]. 3. 6 mm low-attenuation lesion in the head/uncinate process of the pancreas. Follow-up CT abdomen without and with contrast in 2 years is recommended as a cystic pancreatic neoplasm cannot be definitively excluded. This recommendation follows ACR  consensus guidelines: Management of Incidental Pancreatic Cysts:  A White Paper of the ACR Incidental Findings Committee. J Am Coll Radiol 2017;14:911-923. Trace perihepatic fluid. 4. Aortic atherosclerosis (ICD10-I70.0). Coronary artery calcification.  EGD November 2021: - Normal esophagus. Dilated. Query occult mucosal ring at the GE junction?disrupted with dilation - Small hiatal hernia. - Normal duodenal bulb and second portion of the duodenum. - No specimens collected.   Colonoscopy February 2015: Dr. Marcha Solders -Diverticula in the sigmoid colon -Next colonoscopy February 2025   EGD 11/2023: -normal esophagus s/p dilation -portal hypertensive gastropathy -antral ulcer, benign bx, no h.pylori   Medications   Current Outpatient Medications  Medication Sig Dispense Refill   ABRYSVO 120 MCG/0.5ML injection      allopurinol (ZYLOPRIM) 300 MG tablet Take 300 mg by mouth in the morning.      Cholecalciferol (VITAMIN D3) 50 MCG (2000 UT) TABS Take 2,000 Units by mouth daily.     cloNIDine (CATAPRES) 0.3 MG tablet Take 0.3 mg by mouth 2 (two) times daily.      Cyanocobalamin (B-12) 3000 MCG CAPS Take 3,000 mcg by mouth daily.     furosemide (LASIX) 20 MG tablet Take 20 mg by mouth daily as needed for edema.      glipiZIDE (GLUCOTROL XL) 5 MG 24 hr tablet Take 5 mg by mouth in the morning.      Magnesium Bisglycinate (MAG GLYCINATE PO) Take 500 mg by mouth daily.     metFORMIN (GLUCOPHAGE-XR) 500 MG 24 hr tablet Take 500 mg by mouth at bedtime.      metoprolol tartrate (LOPRESSOR) 50 MG tablet Take 50 mg by mouth 2 (two) times daily.     Multiple Vitamin (MULTIVITAMIN WITH MINERALS) TABS tablet Take 1 tablet by mouth daily.     pantoprazole (PROTONIX) 40 MG tablet TAKE 1 TABLET(40 MG) BY MOUTH TWICE DAILY BEFORE A MEAL (Patient taking differently: Take 40 mg by mouth 2 (two) times daily.) 60 tablet 5   valsartan-hydrochlorothiazide (DIOVAN-HCT) 320-12.5 MG tablet Take 1 tablet by mouth  daily.     No current facility-administered medications for this visit.    Allergies   Allergies as of 01/17/2024 - Review Complete 01/17/2024  Allergen Reaction Noted   Gabapentin Other (See Comments) 12/17/2018   Capoten [captopril] Cough 09/10/2019   Celexa [citalopram hydrobromide]  09/10/2019   Hydrocodone  11/11/2020   Lexapro [escitalopram oxalate]  09/10/2019   Lipitor [atorvastatin calcium] Other (See Comments) 09/13/2019   Tramadol  09/10/2019     Past Medical History   Past Medical History:  Diagnosis Date   Arthritis    CHF (congestive heart failure) (HCC)    Diabetes mellitus without complication (HCC)    Dysphagia    Family history of adverse reaction to anesthesia    Mother had problems waking up   GERD (gastroesophageal reflux disease)    Gout    Hypertension    Renal mass, left     Past Surgical History   Past Surgical History:  Procedure Laterality Date   ABDOMINAL HYSTERECTOMY     BIOPSY  12/06/2023   Procedure: BIOPSY;  Surgeon: Corbin Ade, MD;  Location: [facility];  Service: Endoscopy;;   CARPAL TUNNEL RELEASE Left    CATARACT EXTRACTION W/PHACO Right 09/13/2019   Procedure: CATARACT EXTRACTION PHACO AND INTRAOCULAR LENS PLACEMENT RIGHT EYE;  Surgeon: Fabio Pierce, MD;  Location: [facility];  Service: Ophthalmology;  Laterality: Right;  right   CATARACT EXTRACTION W/PHACO Left 09/27/2019   Procedure: CATARACT EXTRACTION PHACO AND INTRAOCULAR LENS PLACEMENT LEFT  EYE  (CDE: 8.04);  Surgeon: Fabio Pierce, MD;  Location: [facility];  Service: Ophthalmology;  Laterality: Left;   ESOPHAGOGASTRODUODENOSCOPY N/A 11/18/2020   Procedure: ESOPHAGOGASTRODUODENOSCOPY (EGD);  Surgeon: Corbin Ade, MD;  Location: [facility];  Service: Endoscopy;  Laterality: N/A;  7:30am   ESOPHAGOGASTRODUODENOSCOPY (EGD) WITH PROPOFOL N/A 12/06/2023   Procedure: ESOPHAGOGASTRODUODENOSCOPY (EGD) WITH PROPOFOL;  Surgeon: Corbin Ade, MD;  Location: [facility];   Service: Endoscopy;  Laterality: N/A;  945am, asa 3   MALONEY DILATION N/A 11/18/2020   Procedure: MALONEY DILATION;  Surgeon: Corbin Ade, MD;  Location: [facility];  Service: Endoscopy;  Laterality: N/A;   MALONEY DILATION  12/06/2023   Procedure: Elease Hashimoto DILATION;  Surgeon: Corbin Ade, MD;  Location: [facility];  Service: Endoscopy;;   ROBOTIC ASSITED PARTIAL NEPHRECTOMY Left 12/07/2021   Procedure: XI ROBOTIC ASSITED PARTIAL NEPHRECTOMY;  Surgeon: Malen Gauze, MD;  Location: [facility];  Service: Urology;  Laterality: Left;   TRIGGER FINGER RELEASE Right    TUBAL LIGATION     WISDOM TOOTH EXTRACTION     XI ROBOTIC ASSISTED VENTRAL HERNIA N/A 07/03/2023   Procedure: XI ROBOTIC ASSISTED VENTRAL HERNIA;  Surgeon: Franky Macho, MD;  Location: [facility];  Service: General;  Laterality: N/A;    Past Family History   Family History  Problem Relation Age of Onset   Hyperlipidemia Mother    Heart attack Mother    Parkinson's disease Father    Stroke Father 82   Hyperlipidemia Sister    Alcoholism Brother    Diabetes Brother    Heart failure Paternal Grandmother    Colon cancer Neg Hx    Gastric cancer Neg Hx    Esophageal cancer Neg Hx    Liver disease Neg Hx    Pancreatic disease Neg Hx     Past Social History   Social History   Socioeconomic History   Marital status: Widowed    Spouse name: Not on file   Number of children: Not on file   Years of education: Not on file   Highest education level: Not on file  Occupational History   Not on file  Tobacco Use   Smoking status: Former    Current packs/day: 0.00    Average packs/day: 0.5 packs/day for 6.0 years (3.0 ttl pk-yrs)    Types: Cigarettes    Start date: 09/10/1979    Quit date: 09/09/1985    Years since quitting: 38.3   Smokeless tobacco: Never  Vaping Use   Vaping status: Never Used  Substance and Sexual Activity   Alcohol use: Not Currently    Comment: no h/o etoh use   Drug use: Never    Sexual activity: Not Currently  Other Topics Concern   Not on file  Social History Narrative   Not on file   Social Drivers of Health   Financial Resource Strain: Not on file  Food Insecurity: Not on file  Transportation Needs: Not on file  Physical Activity: Not on file  Stress: Not on file  Social Connections: Not on file  Intimate Partner Violence: Not on file    Review of Systems   General: Negative for anorexia, weight loss, fever, chills, fatigue, weakness. ENT: Negative for hoarseness, difficulty swallowing , nasal congestion. CV: Negative for chest pain, angina, palpitations, dyspnea on exertion, peripheral edema.  Respiratory: Negative for dyspnea at rest, dyspnea on exertion, cough, sputum, wheezing.  GI: See history of present illness. GU:  Negative for dysuria, hematuria, urinary incontinence, urinary frequency, nocturnal urination.  Endo: Negative for unusual weight change.     Physical Exam   BP 139/76 (BP Location: Right Arm, Patient Position: Sitting, Cuff Size: Normal)   Pulse (!) 57   Temp (!) 97.5 F (36.4 C) (Oral)   Ht 5\' 5"  (1.651 m)   Wt 162 lb 12.8 oz (73.8 kg)   SpO2 98%   BMI 27.09 kg/m    General: Well-nourished, well-developed in no acute distress.  Eyes: No icterus. Mouth: Oropharyngeal mucosa moist and pink   Lungs: Clear to auscultation bilaterally.  Heart: Regular rate and rhythm, no murmurs rubs or gallops.  Abdomen: Bowel sounds are normal, nontender, nondistended, no hepatosplenomegaly or masses,  no abdominal bruits or hernia , no rebound or guarding.  Rectal: not performed  Extremities: No lower extremity edema. No clubbing or deformities. Neuro: Alert and oriented x 4   Skin: Warm and dry, no jaundice.   Psych: Alert and cooperative, normal mood and affect.  Labs   Lab Results  Component Value Date   NA 141 12/25/2023   CL 101 12/25/2023   K 4.2 12/25/2023   CO2 31 12/25/2023   BUN 28 (H) 12/25/2023   CREATININE 1.41  (H) 12/25/2023   GFRNONAA 39 (L) 12/25/2023   CALCIUM 9.7 12/25/2023   ALBUMIN 4.1 12/25/2023   GLUCOSE 156 (H) 12/25/2023   Lab Results  Component Value Date   WBC 7.0 12/25/2023   HGB 12.0 12/25/2023   HCT 35.7 (L) 12/25/2023   MCV 84.0 12/25/2023   PLT 117 (L) 12/25/2023   Lab Results  Component Value Date   ALT 25 12/25/2023   AST 39 12/25/2023   ALKPHOS 82 12/25/2023   BILITOT 0.7 12/25/2023   Lab Results  Component Value Date   TSH 2.594 12/25/2023   Lab Results  Component Value Date   INR 1.0 10/24/2023   INR 1.1 11/26/2021   AFP tumor marker 09/2023 7.4.  Imaging Studies   MR LIVER W WO CONTRAST Result Date: 01/15/2024 CLINICAL DATA:  Hepatocellular carcinoma suspected, history of renal cell carcinoma, liver lesions identified by prior CT EXAM: MRI ABDOMEN WITHOUT AND WITH CONTRAST TECHNIQUE: Multiplanar multisequence MR imaging of the abdomen was performed both before and after the administration of intravenous contrast. CONTRAST:  7mL GADAVIST GADOBUTROL 1 MMOL/ML IV SOLN COMPARISON:  CT abdomen pelvis, 11/29/2023, MR abdomen, 10/07/2021 FINDINGS: Lower chest: Elevation of the right hemidiaphragm with associated atelectasis or consolidation. Hepatobiliary: Coarse, nodular cirrhotic morphology of the liver. Hypoenhancing lesion of the posterior liver dome, hepatic segment VII measuring 0.8 x 0.8 cm, unchanged compared to prior MR dated 2022 (series 14, image 42). Unchanged arterially hyperenhancing lesion of the anterior inferior left lobe of the liver, hepatic segment III, measuring 0.6 cm, without washout or capsular enhancement (series 12, image 65). Benign cyst of the anterior liver dome. Status post cholecystectomy. Postoperative biliary ductal dilatation. Pancreas: Unremarkable. No pancreatic ductal dilatation or surrounding inflammatory changes. Spleen: Splenomegaly, maximum coronal span 14.3 cm. Adrenals/Urinary Tract: Adrenal glands are unremarkable. Unchanged  appearance of partial superior pole left nephrectomy. No suspicious soft tissue or contrast enhancement. No obvious calculi or hydronephrosis. Stomach/Bowel: Mucosal thickening and hyperenhancement of the pylorus and duodenal bulb (series 12, image 64). No evidence of bowel wall thickening, distention, or inflammatory changes. Vascular/Lymphatic: Aortic atherosclerosis. No enlarged abdominal lymph nodes. Other: No abdominal wall hernia or abnormality. No ascites. Musculoskeletal: No acute or significant osseous findings. IMPRESSION: 1. No  evidence of liver metastatic disease. Reported hypoenhancing liver lesions on portal venous phase of prior CT most likely reflect parenchymal fibrosis and nodularity in the setting of cirrhosis. 2. Hypoenhancing lesion of the posterior liver dome, hepatic segment VII measuring 0.8 cm, unchanged compared to prior MR dated 2022. This remains a LI-RADS category 3 lesion in the setting of cirrhosis, established stability reassuring for benign nature. 3. Unchanged arterially hyperenhancing lesion of the anterior inferior left lobe of the liver, hepatic segment III, measuring 0.6 cm, without washout or capsular enhancement. This remains a LI-RADS category 3 lesion in the setting of cirrhosis, established stability reassuring for benign nature. 4. Mild splenomegaly. 5. Unchanged appearance of partial superior pole left nephrectomy. No suspicious soft tissue or contrast enhancement. 6. Mucosal thickening and hyperenhancement of the pylorus and duodenal bulb, consistent with gastritis and duodenitis. Aortic Atherosclerosis (ICD10-I70.0). Electronically Signed   By: Jearld Lesch M.D.   On: 01/15/2024 07:36    Assessment/Plan:   Cirrhosis, well compensated: found incidentally on imaging MELD 3.0 of 8 with last labs. Portal hypertensive gastropathy seen on recent EGD. No esophageal varices -she has two liver lesions on MRI, LI-RADS category 3 in setting of cirrhosis, have been stable,  will continue surveillance for hepatoma. She has next imaging by CT Abd with and without contrast in 04/2024. She may require future surveillance by MRI.  -update labs in March for MELD 3.0 and AFP -complete Hep A/B vaccinations  Dysphagia/GERD: improved s/p esophageal dilation -continue PPI  Antral ulcer: -continue pantoprazole BID -EGD in 02/2024 to verify ulcer healing. ASA 3.  I have discussed the risks, alternatives, benefits with regards to but not limited to the risk of reaction to medication, bleeding, infection, perforation and the patient is agreeable to proceed. Written consent to be obtained.  Right breast lesion:  -noted on CT in May and December 2024. Mammogram in 07/2023 unremarkable.  -request she follow up with PCP to determine if additional imaging needed  Pancreatic lesion: -seen on CT in 02/2022 but not mentioned on subsequent studies. Recent MRI as outline -await CT in 04/2024.   Leanna Battles. Melvyn Neth, MHS, PA-C [facility]

## 2024-01-17 NOTE — Patient Instructions (Addendum)
Continue pantoprazole 40mg  twice daily before breakfast and evening meal. We will be in touch to schedule your endoscopy for 02/2024 to follow up on your stomach ulcer. We will continue to follow your liver function with labs and imaging. Currently you are up to date.  Please talk with Dr. Reuel Boom about the finding in the right breast seen on CT recently. Your mammogram was ok back in 07/2023 but he may want to update sooner. I have provided you with CT report to share with Dr. Reuel Boom.

## 2024-01-17 NOTE — Telephone Encounter (Signed)
Please arrange for labs in 02/2024, hopefully coordinate with labs needed for pre-op, supposed to get egd in 02/2024.   She needs: CBC, CMET, PT/INR, AFP   Please also send Dr. Rosann Auerbach nurse a message to look at her CT A/P from 11/29/23, right breast lesion has been there on past two CTs in May and Dec with normal mammogram 07/2023. She was instructed to follow up with him for further instructions. Please fax copy of CT report as well.

## 2024-01-19 ENCOUNTER — Telehealth: Payer: Self-pay | Admitting: *Deleted

## 2024-01-19 NOTE — Telephone Encounter (Signed)
LMTRC  EGD w/Dr.Rourk, asa 3..wants march

## 2024-01-19 NOTE — Telephone Encounter (Signed)
Pt returned call  [facility]

## 2024-01-23 ENCOUNTER — Other Ambulatory Visit: Payer: Self-pay

## 2024-01-23 ENCOUNTER — Encounter: Payer: Self-pay | Admitting: *Deleted

## 2024-01-23 DIAGNOSIS — K7460 Unspecified cirrhosis of liver: Secondary | ICD-10-CM

## 2024-01-23 NOTE — Telephone Encounter (Signed)
Spoke with Debra Vasquez at [facility] and she is going to make Dr. Reuel Boom aware.   Darl Pikes: Please fax copy of CT to Debra Vasquez at [facility]

## 2024-01-23 NOTE — Telephone Encounter (Signed)
Pt has been scheduled for 03/08/24. Instructions mailed.

## 2024-01-23 NOTE — Telephone Encounter (Signed)
Pt is scheduled for EGD, labs have been ordered and will be mailed to the pt to have completed at time of preop

## 2024-01-23 NOTE — Telephone Encounter (Signed)
Lm for Dr. Rosann Auerbach nurse to call me back.

## 2024-01-24 NOTE — Telephone Encounter (Signed)
noted

## 2024-02-13 DIAGNOSIS — R5383 Other fatigue: Secondary | ICD-10-CM | POA: Diagnosis not present

## 2024-02-13 DIAGNOSIS — D649 Anemia, unspecified: Secondary | ICD-10-CM | POA: Diagnosis not present

## 2024-02-13 DIAGNOSIS — Z1329 Encounter for screening for other suspected endocrine disorder: Secondary | ICD-10-CM | POA: Diagnosis not present

## 2024-02-13 DIAGNOSIS — N183 Chronic kidney disease, stage 3 unspecified: Secondary | ICD-10-CM | POA: Diagnosis not present

## 2024-02-13 DIAGNOSIS — E7849 Other hyperlipidemia: Secondary | ICD-10-CM | POA: Diagnosis not present

## 2024-02-13 DIAGNOSIS — E119 Type 2 diabetes mellitus without complications: Secondary | ICD-10-CM | POA: Diagnosis not present

## 2024-02-13 DIAGNOSIS — K219 Gastro-esophageal reflux disease without esophagitis: Secondary | ICD-10-CM | POA: Diagnosis not present

## 2024-02-13 DIAGNOSIS — Z1321 Encounter for screening for nutritional disorder: Secondary | ICD-10-CM | POA: Diagnosis not present

## 2024-02-20 DIAGNOSIS — I5032 Chronic diastolic (congestive) heart failure: Secondary | ICD-10-CM | POA: Diagnosis not present

## 2024-02-20 DIAGNOSIS — F331 Major depressive disorder, recurrent, moderate: Secondary | ICD-10-CM | POA: Diagnosis not present

## 2024-02-20 DIAGNOSIS — E7849 Other hyperlipidemia: Secondary | ICD-10-CM | POA: Diagnosis not present

## 2024-02-20 DIAGNOSIS — E782 Mixed hyperlipidemia: Secondary | ICD-10-CM | POA: Diagnosis not present

## 2024-02-20 DIAGNOSIS — Z6826 Body mass index (BMI) 26.0-26.9, adult: Secondary | ICD-10-CM | POA: Diagnosis not present

## 2024-02-20 DIAGNOSIS — J309 Allergic rhinitis, unspecified: Secondary | ICD-10-CM | POA: Diagnosis not present

## 2024-02-21 DIAGNOSIS — R92333 Mammographic heterogeneous density, bilateral breasts: Secondary | ICD-10-CM | POA: Diagnosis not present

## 2024-02-21 DIAGNOSIS — N6489 Other specified disorders of breast: Secondary | ICD-10-CM | POA: Diagnosis not present

## 2024-03-04 NOTE — Patient Instructions (Addendum)
 Debra Vasquez  03/04/2024     @PREFPERIOPPHARMACY @   Your procedure is scheduled on 03/08/2024.   Report to [facility] at 8:00 A.M.   Call this number if you have problems the morning of surgery:   [phone]  If you experience any cold or flu symptoms such as cough, fever, chills, shortness of breath, etc. between now and your scheduled surgery, please notify us at the above number.   Remember:  Do not eat or drink after midnight.  You may drink clear liquids until 6:00AM .  Clear liquids allowed are:                    Water, Juice (No red color; non-citric and without pulp; diabetics please choose diet or no sugar options), Carbonated beverages (diabetics please choose diet or no sugar options), Clear Tea (No creamer, milk, or cream, including half & half and powdered creamer), and Clear Sports drink (No red color; diabetics please choose diet or no sugar options)    Take these medicines the morning of surgery with A SIP OF WATER : Zyloprim Catapres Metoprolol and Protonix    Do not wear jewelry, make-up or nail polish, including gel polish,  artificial nails, or any other type of covering on natural nails (fingers and  toes).  Do not wear lotions, powders, or perfumes, or deodorant.  Do not shave 48 hours prior to surgery.  Men may shave face and neck.  Do not bring valuables to the hospital.  [facility] is not responsible for any belongings or valuables.  Contacts, dentures or bridgework may not be worn into surgery.  Leave your suitcase in the car.  After surgery it may be brought to your room.  For patients admitted to the hospital, discharge time will be determined by your treatment team.  Patients discharged the day of surgery will not be allowed to drive home.   Name and phone number of your driver:   Family Special instructions:  N/A  Please read over the following fact sheets that you were given. Anesthesia Post-op Instructions and Care and Recovery  After Surgery  Upper Endoscopy, Adult Upper endoscopy is a procedure to look inside the upper GI (gastrointestinal) tract. The upper GI tract is made up of: The esophagus. This is the part of the body that moves food from your mouth to your stomach. The stomach. The duodenum. This is the first part of your small intestine. This procedure is also called esophagogastroduodenoscopy (EGD) or gastroscopy. In this procedure, your health care provider passes a thin, flexible tube (endoscope) through your mouth and down your esophagus into your stomach and into your duodenum. A small camera is attached to the end of the tube. Images from the camera appear on a monitor in the exam room. During this procedure, your health care provider may also remove a small piece of tissue to be sent to a lab and examined under a microscope (biopsy). Your health care provider may do an upper endoscopy to diagnose cancers of the upper GI tract. You may also have this procedure to find the cause of other conditions, such as: Stomach pain. Heartburn. Pain or problems when swallowing. Nausea and vomiting. Stomach bleeding. Stomach ulcers. Tell a health care provider about: Any allergies you have. All medicines you are taking, including vitamins, herbs, eye drops, creams, and over-the-counter medicines. Any problems you or family members have had with anesthetic medicines. Any bleeding problems you have. Any surgeries you  have had. Any medical conditions you have. Whether you are pregnant or may be pregnant. What are the risks? Your healthcare provider will talk with you about risks. These may include: Infection. Bleeding. Allergic reactions to medicines. A tear or hole (perforation) in the esophagus, stomach, or duodenum. What happens before the procedure? When to stop eating and drinking Follow instructions from your health care provider about what you may eat and drink. These may include: 8 hours before your  procedure Stop eating most foods. Do not eat meat, fried foods, or fatty foods. Eat only light foods, such as toast or crackers. All liquids are okay except energy drinks and alcohol. 6 hours before your procedure Stop eating. Drink only clear liquids, such as water, clear fruit juice, black coffee, plain tea, and sports drinks. Do not drink energy drinks or alcohol. 2 hours before your procedure Stop drinking all liquids. You may be allowed to take medicines with small sips of water. If you do not follow your health care provider's instructions, your procedure may be delayed or canceled. Medicines Ask your health care provider about: Changing or stopping your regular medicines. This is especially important if you are taking diabetes medicines or blood thinners. Taking medicines such as aspirin and ibuprofen. These medicines can thin your blood. Do not take these medicines unless your health care provider tells you to take them. Taking over-the-counter medicines, vitamins, herbs, and supplements. General instructions If you will be going home right after the procedure, plan to have a responsible adult: Take you home from the hospital or clinic. You will not be allowed to drive. Care for you for the time you are told. What happens during the procedure?  An IV will be inserted into one of your veins. You may be given one or more of the following: A medicine to help you relax (sedative). A medicine to numb the throat (local anesthetic). You will lie on your left side on an exam table. Your health care provider will pass the endoscope through your mouth and down your esophagus. Your health care provider will use the scope to check the inside of your esophagus, stomach, and duodenum. Biopsies may be taken. The endoscope will be removed. The procedure may vary among health care providers and hospitals. What happens after the procedure? Your blood pressure, heart rate, breathing rate, and  blood oxygen level will be monitored until you leave the hospital or clinic. When your throat is no longer numb, you may be given some fluids to drink. If you were given a sedative during the procedure, it can affect you for several hours. Do not drive or operate machinery until your health care provider says that it is safe. It is up to you to get the results of your procedure. Ask your health care provider, or the department that is doing the procedure, when your results will be ready. Contact a health care provider if you: Have a sore throat that lasts longer than 1 day. Have a fever. Get help right away if you: Vomit blood or your vomit looks like coffee grounds. Have bloody, black, or tarry stools. Have a very bad sore throat or you cannot swallow. Have difficulty breathing or very bad pain in your chest or abdomen. These symptoms may be an emergency. Get help right away. Call 911. Do not wait to see if the symptoms will go away. Do not drive yourself to the hospital. Summary Upper endoscopy is a procedure to look inside the upper GI tract.  During the procedure, an IV will be inserted into one of your veins. You may be given a medicine to help you relax. The endoscope will be passed through your mouth and down your esophagus. Follow instructions from your health care provider about what you can eat and drink. This information is not intended to replace advice given to you by your health care provider. Make sure you discuss any questions you have with your health care provider. Document Revised: 03/23/2022 Document Reviewed: 03/23/2022 Elsevier Patient Education  2024 [employer]  Monitored Anesthesia Care Anesthesia refers to the techniques, procedures, and medicines that help a person stay safe and comfortable during surgery. Monitored anesthesia care, or sedation, is one type of anesthesia. You may have sedation if you do not need to be asleep for your procedure. Procedures that  use sedation may include: Surgery to remove cataracts from your eyes. A dental procedure. A biopsy. This is when a tissue sample is removed and looked at under a microscope. You will be watched closely during your procedure. Your level of sedation or type of anesthesia may be changed to fit your needs. Tell a health care provider about: Any allergies you have. All medicines you are taking, including vitamins, herbs, eye drops, creams, and over-the-counter medicines. Any problems you or family members have had with anesthesia. Any bleeding problems you have. Any surgeries you have had. Any medical conditions or illnesses you have. This includes sleep apnea, cough, fever, or the flu. Whether you are pregnant or may be pregnant. Whether you use cigarettes, alcohol, or drugs. Any use of steroids, whether by mouth or as a cream. What are the risks? Your health care provider will talk with you about risks. These may include: Getting too much medicine (oversedation). Nausea. Allergic reactions to medicines. Trouble breathing. If this happens, a breathing tube may be used to help you breathe. It will be removed when you are awake and breathing on your own. Heart trouble. Lung trouble. Confusion that gets better with time (emergence delirium). What happens before the procedure? When to stop eating and drinking Follow instructions from your health care provider about what you may eat and drink. These may include: 8 hours before your procedure Stop eating most foods. Do not eat meat, fried foods, or fatty foods. Eat only light foods, such as toast or crackers. All liquids are okay except energy drinks and alcohol. 6 hours before your procedure Stop eating. Drink only clear liquids, such as water, clear fruit juice, black coffee, plain tea, and sports drinks. Do not drink energy drinks or alcohol. 2 hours before your procedure Stop drinking all liquids. You may be allowed to take medicines  with small sips of water. If you do not follow your health care provider's instructions, your procedure may be delayed or canceled. Medicines Ask your health care provider about: Changing or stopping your regular medicines. These include any diabetes medicines or blood thinners you take. Taking medicines such as aspirin and ibuprofen. These medicines can thin your blood. Do not take them unless your health care provider tells you to. Taking over-the-counter medicines, vitamins, herbs, and supplements. Testing You may have an exam or testing. You may have a blood or urine sample taken. General instructions Do not use any products that contain nicotine or tobacco for at least 4 weeks before the procedure. These products include cigarettes, chewing tobacco, and vaping devices, such as e-cigarettes. If you need help quitting, ask your health care provider. If you will be going  home right after the procedure, plan to have a responsible adult: Take you home from the hospital or clinic. You will not be allowed to drive. Care for you for the time you are told. What happens during the procedure?  Your blood pressure, heart rate, breathing, level of pain, and blood oxygen level will be monitored. An IV will be inserted into one of your veins. You may be given: A sedative. This helps you relax. Anesthesia. This will: Numb certain areas of your body. Make you fall asleep for surgery. You will be given medicines as needed to keep you comfortable. The more medicine you are given, the deeper your level of sedation will be. Your level of sedation may be changed to fit your needs. There are three levels of sedation: Mild sedation. At this level, you may feel awake and relaxed. You will be able to follow directions. Moderate sedation. At this level, you will be sleepy. You may not remember the procedure. Deep sedation. At this level, you will be asleep. You will not remember the procedure. How you get the  medicines will depend on your age and the procedure. They may be given as: A pill. This may be taken by mouth (orally) or inserted into the rectum. An injection. This may be into a vein or muscle. A spray through the nose. After your procedure is over, the medicine will be stopped. The procedure may vary among health care providers and hospitals. What happens after the procedure? Your blood pressure, heart rate, breathing rate, and blood oxygen level will be monitored until you leave the hospital or clinic. You may feel sleepy, clumsy, or nauseous. You may not remember what happened during or after the procedure. Sedation can affect you for several hours. Do not drive or use machinery until your health care provider says that it is safe. This information is not intended to replace advice given to you by your health care provider. Make sure you discuss any questions you have with your health care provider. Document Revised: 05/09/2022 Document Reviewed: 05/09/2022 Elsevier Patient Education  2024 [employer].

## 2024-03-05 ENCOUNTER — Encounter (HOSPITAL_COMMUNITY)
Admission: RE | Admit: 2024-03-05 | Discharge: 2024-03-05 | Disposition: A | Discharge status: Home / Self Care | Payer: Medicare Other | Source: Ambulatory Visit | Attending: Internal Medicine | Admitting: Internal Medicine

## 2024-03-05 ENCOUNTER — Encounter (HOSPITAL_COMMUNITY): Payer: Self-pay

## 2024-03-05 ENCOUNTER — Other Ambulatory Visit: Payer: Self-pay

## 2024-03-05 VITALS — BP 131/66 | HR 54 | Temp 98.0°F | Resp 18 | Ht 65.0 in | Wt 162.7 lb

## 2024-03-05 DIAGNOSIS — I77810 Thoracic aortic ectasia: Secondary | ICD-10-CM | POA: Diagnosis not present

## 2024-03-05 DIAGNOSIS — R001 Bradycardia, unspecified: Secondary | ICD-10-CM | POA: Insufficient documentation

## 2024-03-05 DIAGNOSIS — Z01818 Encounter for other preprocedural examination: Secondary | ICD-10-CM | POA: Insufficient documentation

## 2024-03-05 DIAGNOSIS — K7030 Alcoholic cirrhosis of liver without ascites: Secondary | ICD-10-CM | POA: Diagnosis not present

## 2024-03-05 DIAGNOSIS — R9389 Abnormal findings on diagnostic imaging of other specified body structures: Secondary | ICD-10-CM | POA: Insufficient documentation

## 2024-03-05 DIAGNOSIS — I10 Essential (primary) hypertension: Secondary | ICD-10-CM | POA: Diagnosis not present

## 2024-03-05 DIAGNOSIS — R7989 Other specified abnormal findings of blood chemistry: Secondary | ICD-10-CM

## 2024-03-05 DIAGNOSIS — K769 Liver disease, unspecified: Secondary | ICD-10-CM

## 2024-03-05 HISTORY — DX: Malignant (primary) neoplasm, unspecified: C80.1

## 2024-03-05 LAB — BASIC METABOLIC PANEL
Anion gap: 12 (ref 5–15)
BUN: 20 mg/dL (ref 8–23)
CO2: 25 mmol/L (ref 22–32)
Calcium: 8.8 mg/dL — ABNORMAL LOW (ref 8.9–10.3)
Chloride: 103 mmol/L (ref 98–111)
Creatinine, Ser: 1.15 mg/dL — ABNORMAL HIGH (ref 0.44–1.00)
GFR, Estimated: 49 mL/min — ABNORMAL LOW (ref >60)
Glucose, Bld: 136 mg/dL — ABNORMAL HIGH (ref 70–99)
Potassium: 3.6 mmol/L (ref 3.5–5.1)
Sodium: 140 mmol/L (ref 135–145)

## 2024-03-05 LAB — CBC WITH DIFFERENTIAL/PLATELET
Abs Immature Granulocytes: 0.02 10*3/uL (ref 0.00–0.07)
Basophils Absolute: 0.1 10*3/uL (ref 0.0–0.1)
Basophils Relative: 1 %
Eosinophils Absolute: 0.2 10*3/uL (ref 0.0–0.5)
Eosinophils Relative: 3 %
HCT: 36.6 % (ref 36.0–46.0)
Hemoglobin: 11.7 g/dL — ABNORMAL LOW (ref 12.0–15.0)
Immature Granulocytes: 0 %
Lymphocytes Relative: 36 %
Lymphs Abs: 2.6 10*3/uL (ref 0.7–4.0)
MCH: 27.1 pg (ref 26.0–34.0)
MCHC: 32 g/dL (ref 30.0–36.0)
MCV: 84.9 fL (ref 80.0–100.0)
Monocytes Absolute: 0.5 10*3/uL (ref 0.1–1.0)
Monocytes Relative: 7 %
Neutro Abs: 4 10*3/uL (ref 1.7–7.7)
Neutrophils Relative %: 53 %
Platelets: 148 10*3/uL — ABNORMAL LOW (ref 150–400)
RBC: 4.31 MIL/uL (ref 3.87–5.11)
RDW: 15.1 % (ref 11.5–15.5)
WBC: 7.4 10*3/uL (ref 4.0–10.5)
nRBC: 0 % (ref 0.0–0.2)

## 2024-03-06 LAB — AFP TUMOR MARKER: AFP, Serum, Tumor Marker: 7.6 ng/mL (ref 0.0–9.2)

## 2024-03-08 ENCOUNTER — Encounter (HOSPITAL_COMMUNITY): Payer: Self-pay | Admitting: Internal Medicine

## 2024-03-08 ENCOUNTER — Ambulatory Visit (HOSPITAL_COMMUNITY): Admitting: Certified Registered Nurse Anesthetist

## 2024-03-08 ENCOUNTER — Ambulatory Visit (HOSPITAL_COMMUNITY)
Admission: RE | Admit: 2024-03-08 | Discharge: 2024-03-08 | Disposition: A | Discharge status: Home / Self Care | Payer: Medicare Other | Attending: Internal Medicine | Admitting: Internal Medicine

## 2024-03-08 ENCOUNTER — Encounter (HOSPITAL_COMMUNITY)
Admission: RE | Disposition: A | Discharge status: Home / Self Care | Payer: Self-pay | Source: Home / Self Care | Attending: Internal Medicine

## 2024-03-08 ENCOUNTER — Other Ambulatory Visit: Payer: Self-pay

## 2024-03-08 DIAGNOSIS — I509 Heart failure, unspecified: Secondary | ICD-10-CM | POA: Diagnosis not present

## 2024-03-08 DIAGNOSIS — K766 Portal hypertension: Secondary | ICD-10-CM

## 2024-03-08 DIAGNOSIS — I110 Hypertensive heart disease with heart failure: Secondary | ICD-10-CM | POA: Insufficient documentation

## 2024-03-08 DIAGNOSIS — K279 Peptic ulcer, site unspecified, unspecified as acute or chronic, without hemorrhage or perforation: Secondary | ICD-10-CM

## 2024-03-08 DIAGNOSIS — Z8719 Personal history of other diseases of the digestive system: Secondary | ICD-10-CM | POA: Insufficient documentation

## 2024-03-08 DIAGNOSIS — N289 Disorder of kidney and ureter, unspecified: Secondary | ICD-10-CM | POA: Insufficient documentation

## 2024-03-08 DIAGNOSIS — K259 Gastric ulcer, unspecified as acute or chronic, without hemorrhage or perforation: Secondary | ICD-10-CM | POA: Diagnosis present

## 2024-03-08 DIAGNOSIS — Z8711 Personal history of peptic ulcer disease: Secondary | ICD-10-CM | POA: Diagnosis not present

## 2024-03-08 DIAGNOSIS — Z87891 Personal history of nicotine dependence: Secondary | ICD-10-CM | POA: Diagnosis not present

## 2024-03-08 DIAGNOSIS — E119 Type 2 diabetes mellitus without complications: Secondary | ICD-10-CM | POA: Diagnosis not present

## 2024-03-08 DIAGNOSIS — K7460 Unspecified cirrhosis of liver: Secondary | ICD-10-CM | POA: Diagnosis not present

## 2024-03-08 DIAGNOSIS — Z7984 Long term (current) use of oral hypoglycemic drugs: Secondary | ICD-10-CM | POA: Insufficient documentation

## 2024-03-08 DIAGNOSIS — K219 Gastro-esophageal reflux disease without esophagitis: Secondary | ICD-10-CM | POA: Insufficient documentation

## 2024-03-08 DIAGNOSIS — K3189 Other diseases of stomach and duodenum: Secondary | ICD-10-CM | POA: Diagnosis not present

## 2024-03-08 HISTORY — PX: ESOPHAGOGASTRODUODENOSCOPY (EGD) WITH PROPOFOL: SHX5813

## 2024-03-08 LAB — GLUCOSE, CAPILLARY: Glucose-Capillary: 122 mg/dL — ABNORMAL HIGH (ref 70–99)

## 2024-03-08 SURGERY — ESOPHAGOGASTRODUODENOSCOPY (EGD) WITH PROPOFOL
Anesthesia: General

## 2024-03-08 MED ORDER — STERILE WATER FOR IRRIGATION IR SOLN
Status: DC | PRN
  Administered 2024-03-08: 60 mL

## 2024-03-08 MED ORDER — PROPOFOL 500 MG/50ML IV EMUL
INTRAVENOUS | Status: DC | PRN
  Administered 2024-03-08: 80 mg via INTRAVENOUS
  Administered 2024-03-08: 150 ug/kg/min via INTRAVENOUS

## 2024-03-08 MED ORDER — LACTATED RINGERS IV SOLN: INTRAVENOUS | Status: DC | PRN

## 2024-03-08 MED ORDER — LIDOCAINE HCL (CARDIAC) PF 100 MG/5ML IV SOSY
PREFILLED_SYRINGE | INTRAVENOUS | Status: DC | PRN
  Administered 2024-03-08: 50 mg via INTRAVENOUS

## 2024-03-08 NOTE — Progress Notes (Signed)
 Left bottom lip is swollen and red. Instructed patient to use ice for it. She verbalized understanding.

## 2024-03-08 NOTE — Anesthesia Postprocedure Evaluation (Signed)
 Anesthesia Post Note  Patient: Debra Vasquez  Procedure(s) Performed: ESOPHAGOGASTRODUODENOSCOPY (EGD) WITH PROPOFOL  Patient location during evaluation: Phase II Anesthesia Type: General Level of consciousness: awake Pain management: pain level controlled Vital Signs Assessment: post-procedure vital signs reviewed and stable Respiratory status: spontaneous breathing and respiratory function stable Cardiovascular status: blood pressure returned to baseline and stable Postop Assessment: no headache and no apparent nausea or vomiting Anesthetic complications: no Comments: Late entry   No notable events documented.   Last Vitals:  Vitals:   03/08/24 0759 03/08/24 0921  BP: (!) 170/81 (!) 101/44  Pulse: (!) 57 (!) 58  Resp: 17 16  Temp: 36.6 C (!) 35.9 C  SpO2: 100% 97%    Last Pain:  Vitals:   03/08/24 0921  TempSrc: Axillary  PainSc: 0-No pain                 Windell Norfolk

## 2024-03-08 NOTE — Transfer of Care (Signed)
 Immediate Anesthesia Transfer of Care Note  Patient: Debra Vasquez  Procedure(s) Performed: ESOPHAGOGASTRODUODENOSCOPY (EGD) WITH PROPOFOL  Patient Location: Short Stay  Anesthesia Type:General  Level of Consciousness: drowsy, patient cooperative, and responds to stimulation  Airway & Oxygen Therapy: Patient Spontanous Breathing  Post-op Assessment: Report given to RN, Post -op Vital signs reviewed and stable, Patient moving all extremities X 4, and Patient able to stick tongue midline  Post vital signs: Reviewed and stable  Last Vitals:  Vitals Value Taken Time  BP 101/44 03/08/24 0922  Temp 96.7   Pulse 57 03/08/24 0923  Resp 16 03/08/24 0923  SpO2 97 % 03/08/24 0923  Vitals shown include unfiled device data.  Last Pain:  Vitals:   03/08/24 0909  TempSrc:   PainSc: 0-No pain         Complications: No notable events documented.

## 2024-03-08 NOTE — Anesthesia Preprocedure Evaluation (Signed)
 Anesthesia Evaluation  Patient identified by MRN, date of birth, ID band Patient awake    Reviewed: Allergy & Precautions, H&P , NPO status , Patient's Chart, lab work & pertinent test results, reviewed documented beta blocker date and time   Airway Mallampati: II  TM Distance: >3 FB Neck ROM: full    Dental no notable dental hx.    Pulmonary neg pulmonary ROS, former smoker   Pulmonary exam normal breath sounds clear to auscultation       Cardiovascular Exercise Tolerance: Good hypertension, +CHF   Rhythm:regular Rate:Normal     Neuro/Psych negative neurological ROS  negative psych ROS   GI/Hepatic PUD,GERD  ,,(+) Cirrhosis   Esophageal Varices      Endo/Other  diabetes, Type 2    Renal/GU Renal disease  negative genitourinary   Musculoskeletal   Abdominal   Peds  Hematology negative hematology ROS (+)   Anesthesia Other Findings   Reproductive/Obstetrics negative OB ROS                             Anesthesia Physical Anesthesia Plan  ASA: 3  Anesthesia Plan: General   Post-op Pain Management:    Induction:   PONV Risk Score and Plan: Propofol infusion  Airway Management Planned:   Additional Equipment:   Intra-op Plan:   Post-operative Plan:   Informed Consent: I have reviewed the patients History and Physical, chart, labs and discussed the procedure including the risks, benefits and alternatives for the proposed anesthesia with the patient or authorized representative who has indicated his/her understanding and acceptance.     Dental Advisory Given  Plan Discussed with: CRNA  Anesthesia Plan Comments:        Anesthesia Quick Evaluation

## 2024-03-08 NOTE — Op Note (Signed)
 [facility] Patient Name: Debra Vasquez Procedure Date: 03/08/2024 8:45 AM MRN: [id number] Date of Birth: 10-05-1946 Attending MD: Gennette Pac , MD, [id number] CSN: [id number] Age: 78 Admit Type: Outpatient Procedure:                Upper GI endoscopy Indications:              Peptic ulcer Providers:                Gennette Pac, MD, Crystal Page, Elinor Parkinson Referring MD:              Medicines:                Propofol per Anesthesia Complications:            No immediate complications. Estimated Blood Loss:     Estimated blood loss: none. Procedure:                Pre-Anesthesia Assessment:                           - Prior to the procedure, a History and Physical                            was performed, and patient medications and                            allergies were reviewed. The patient's tolerance of                            previous anesthesia was also reviewed. The risks                            and benefits of the procedure and the sedation                            options and risks were discussed with the patient.                            All questions were answered, and informed consent                            was obtained. Prior Anticoagulants: The patient has                            taken no anticoagulant or antiplatelet agents. ASA                            Grade Assessment: III - A patient with severe                            systemic disease. After reviewing the risks and  benefits, the patient was deemed in satisfactory                            condition to undergo the procedure.                           After obtaining informed consent, the endoscope was                            passed under direct vision. Throughout the                            procedure, the patient's blood pressure, pulse, and                            oxygen saturations were  monitored continuously. The                            [id number]-H190 ([id number]) scope was introduced through the                            mouth, and advanced to the second part of duodenum.                            The upper GI endoscopy was accomplished without                            difficulty. The patient tolerated the procedure                            well. Scope In: 9:14:36 AM Scope Out: 9:17:04 AM Total Procedure Duration: 0 hours 2 minutes 28 seconds  Findings:      The examined esophagus was normal.      Mild portal hypertensive gastropathy was found in the entire examined       stomach. Deformity of the antrum present. However previously noted       gastric ulcers completely healed      The duodenal bulb and second portion of the duodenum were normal. Impression:               - Normal esophagus.                           - Portal hypertensive gastropathy. Deformity of the                            antrum. Previously noted gastric ulcer completely                            healed.                           - Normal duodenal bulb and second portion of the                            duodenum.                           -  No specimens collected. Moderate Sedation:      Moderate (conscious) sedation was personally administered by an       anesthesia professional. The following parameters were monitored: oxygen       saturation, heart rate, blood pressure, respiratory rate, EKG, adequacy       of pulmonary ventilation, and response to care. Recommendation:           - Patient has a contact number available for                            emergencies. The signs and symptoms of potential                            delayed complications were discussed with the                            patient. Return to normal activities tomorrow.                            Written discharge instructions were provided to the                            patient.                           -  Resume previous diet.                           - Continue present medications. Continue to avoid                            all NSAIDs                           - Return to my office in 6 months. Procedure Code(s):        --- Professional ---                           [phone], Esophagogastroduodenoscopy, flexible,                            transoral; diagnostic, including collection of                            specimen(s) by brushing or washing, when performed                            (separate procedure) Diagnosis Code(s):        --- Professional ---                           K76.6, Portal hypertension                           K31.89, Other diseases of stomach and duodenum  K27.9, Peptic ulcer, site unspecified, unspecified                            as acute or chronic, without hemorrhage or                            perforation CPT copyright 2022 American Medical Association. All rights reserved. The codes documented in this report are preliminary and upon coder review may  be revised to meet current compliance requirements. Gerrit Friends. Lailynn Southgate, MD Gennette Pac, MD 03/08/2024 9:25:44 AM This report has been signed electronically. Number of Addenda: 0

## 2024-03-08 NOTE — Discharge Instructions (Signed)
 EGD Discharge instructions Please read the instructions outlined below and refer to this sheet in the next few weeks. These discharge instructions provide you with general information on caring for yourself after you leave the hospital. Your doctor may also give you specific instructions. While your treatment has been planned according to the most current medical practices available, unavoidable complications occasionally occur. If you have any problems or questions after discharge, please call your doctor. ACTIVITY You may resume your regular activity but move at a slower pace for the next 24 hours.  Take frequent rest periods for the next 24 hours.  Walking will help expel (get rid of) the air and reduce the bloated feeling in your abdomen.  No driving for 24 hours (because of the anesthesia (medicine) used during the test).  You may shower.  Do not sign any important legal documents or operate any machinery for 24 hours (because of the anesthesia used during the test).  NUTRITION Drink plenty of fluids.  You may resume your normal diet.  Begin with a light meal and progress to your normal diet.  Avoid alcoholic beverages for 24 hours or as instructed by your caregiver.  MEDICATIONS You may resume your normal medications unless your caregiver tells you otherwise.  WHAT YOU CAN EXPECT TODAY You may experience abdominal discomfort such as a feeling of fullness or "gas" pains.  FOLLOW-UP Your doctor will discuss the results of your test with you.  SEEK IMMEDIATE MEDICAL ATTENTION IF ANY OF THE FOLLOWING OCCUR: Excessive nausea (feeling sick to your stomach) and/or vomiting.  Severe abdominal pain and distention (swelling).  Trouble swallowing.  Temperature over 101 F (37.8 C).  Rectal bleeding or vomiting of blood.     Previously noted gastric ulcer completely healed.  As previously discussed, continue to avoid all NSAIDs like Aleve and aspirin.  Office visit with Tana Coast in 6  months if not already scheduled  At patient request, I called Cherly Beach at [phone] -call rolled to voicemail box-"voicemail box full"

## 2024-03-08 NOTE — H&P (Signed)
 @LOGO @   Primary Care Physician:  Richardean Chimera, MD Primary Gastroenterologist:  Dr. Jena Gauss  Pre-Procedure History & Physical: HPI:  Debra Vasquez is a 78 y.o. female here for surveillance EGD history of prepyloric gastric ulcer identified previously.  History of cirrhosis.  Portal hypertensive gastropathy seen previously.  Denies dysphagia  Past Medical History:  Diagnosis Date   Arthritis    Cancer (HCC)    CHF (congestive heart failure) (HCC)    Cirrhosis (HCC)    Diabetes mellitus without complication (HCC)    Dysphagia    GERD (gastroesophageal reflux disease)    Gout    Hypertension    Renal mass, left     Past Surgical History:  Procedure Laterality Date   ABDOMINAL HYSTERECTOMY     BIOPSY  12/06/2023   Procedure: BIOPSY;  Surgeon: Corbin Ade, MD;  [facility];  Service: Endoscopy;;   CARPAL TUNNEL RELEASE Left    CATARACT EXTRACTION W/PHACO Right 09/13/2019   Procedure: CATARACT EXTRACTION PHACO AND INTRAOCULAR LENS PLACEMENT RIGHT EYE;  Surgeon: Fabio Pierce, MD;  Location: [facility];  Service: Ophthalmology;  Laterality: Right;  right   CATARACT EXTRACTION W/PHACO Left 09/27/2019   Procedure: CATARACT EXTRACTION PHACO AND INTRAOCULAR LENS PLACEMENT LEFT EYE  (CDE: 8.04);  Surgeon: Fabio Pierce, MD;  Location: [facility];  Service: Ophthalmology;  Laterality: Left;   ESOPHAGOGASTRODUODENOSCOPY N/A 11/18/2020   Procedure: ESOPHAGOGASTRODUODENOSCOPY (EGD);  Surgeon: Corbin Ade, MD;  [facility];  Service: Endoscopy;  Laterality: N/A;  7:30am   ESOPHAGOGASTRODUODENOSCOPY (EGD) WITH PROPOFOL N/A 12/06/2023   Procedure: ESOPHAGOGASTRODUODENOSCOPY (EGD) WITH PROPOFOL;  Surgeon: Corbin Ade, MD;  [facility];  Service: Endoscopy;  Laterality: N/A;  945am, asa 3   MALONEY DILATION N/A 11/18/2020   Procedure: MALONEY DILATION;  Surgeon: Corbin Ade, MD;  [facility];  Service: Endoscopy;  Laterality: N/A;    MALONEY DILATION  12/06/2023   Procedure: Elease Hashimoto DILATION;  Surgeon: Corbin Ade, MD;  [facility];  Service: Endoscopy;;   ROBOTIC ASSITED PARTIAL NEPHRECTOMY Left 12/07/2021   Procedure: XI ROBOTIC ASSITED PARTIAL NEPHRECTOMY;  Surgeon: Malen Gauze, MD;  Location: [facility];  Service: Urology;  Laterality: Left;   TRIGGER FINGER RELEASE Right    TUBAL LIGATION     WISDOM TOOTH EXTRACTION     XI ROBOTIC ASSISTED VENTRAL HERNIA N/A 07/03/2023   Procedure: XI ROBOTIC ASSISTED VENTRAL HERNIA;  Surgeon: Franky Macho, MD;  Location: [facility];  Service: General;  Laterality: N/A;    Prior to Admission medications   Medication Sig Start Date End Date Taking? Authorizing Provider  allopurinol (ZYLOPRIM) 300 MG tablet Take 300 mg by mouth in the morning.    Yes [provider]  Cholecalciferol (VITAMIN D3) 50 MCG (2000 UT) TABS Take 2,000 Units by mouth daily.   Yes [provider]  cloNIDine (CATAPRES) 0.3 MG tablet Take 0.3 mg by mouth 2 (two) times daily.  07/06/19  Yes [provider]  Cyanocobalamin (B-12) 3000 MCG CAPS Take 3,000 mcg by mouth daily.   Yes [provider]  furosemide (LASIX) 20 MG tablet Take 20 mg by mouth daily as needed for edema.    Yes [provider]  glipiZIDE (GLUCOTROL XL) 5 MG 24 hr tablet Take 5 mg by mouth in the morning.    Yes [provider]  Magnesium Bisglycinate (MAG GLYCINATE PO) Take 500 mg by mouth daily.  Yes [provider]  metFORMIN (GLUCOPHAGE-XR) 500 MG 24 hr tablet Take 500 mg by mouth at bedtime.  06/25/19  Yes [provider]  metoprolol tartrate (LOPRESSOR) 50 MG tablet Take 50 mg by mouth 2 (two) times daily.   Yes [provider]  Multiple Vitamin (MULTIVITAMIN WITH MINERALS) TABS tablet Take 1 tablet by mouth daily.   Yes [provider]  pantoprazole (PROTONIX) 40 MG tablet TAKE 1 TABLET(40 MG) BY MOUTH TWICE DAILY BEFORE A MEAL Patient  taking differently: Take 40 mg by mouth 2 (two) times daily. 05/12/21  Yes Letta Median, PA-C  valsartan-hydrochlorothiazide (DIOVAN-HCT) 320-12.5 MG tablet Take 1 tablet by mouth daily.   Yes [provider]    Allergies as of 01/23/2024 - Review Complete 01/17/2024  Allergen Reaction Noted   Gabapentin Other (See Comments) 12/17/2018   Capoten [captopril] Cough 09/10/2019   Celexa [citalopram hydrobromide]  09/10/2019   Hydrocodone  11/11/2020   Lexapro [escitalopram oxalate]  09/10/2019   Lipitor [atorvastatin calcium] Other (See Comments) 09/13/2019   Tramadol  09/10/2019    Family History  Problem Relation Age of Onset   Hyperlipidemia Mother    Heart attack Mother    Parkinson's disease Father    Stroke Father 67   Hyperlipidemia Sister    Alcoholism Brother    Diabetes Brother    Heart failure Paternal Grandmother    Colon cancer Neg Hx    Gastric cancer Neg Hx    Esophageal cancer Neg Hx    Liver disease Neg Hx    Pancreatic disease Neg Hx     Social History   Socioeconomic History   Marital status: Widowed    Spouse name: Not on file   Number of children: Not on file   Years of education: Not on file   Highest education level: Not on file  Occupational History   Not on file  Tobacco Use   Smoking status: Former    Current packs/day: 0.00    Average packs/day: 0.5 packs/day for 6.0 years (3.0 ttl pk-yrs)    Types: Cigarettes    Start date: 09/10/1979    Quit date: 09/09/1985    Years since quitting: 38.5   Smokeless tobacco: Never  Vaping Use   Vaping status: Never Used  Substance and Sexual Activity   Alcohol use: Not Currently    Comment: no h/o etoh use   Drug use: Never   Sexual activity: Not Currently  Other Topics Concern   Not on file  Social History Narrative   Not on file   Social Drivers of Health   Financial Resource Strain: Not on file  Food Insecurity: Not on file  Transportation Needs: Not on file  Physical Activity:  Not on file  Stress: Not on file  Social Connections: Not on file  Intimate Partner Violence: Not on file    Review of Systems: See HPI, otherwise negative ROS  Physical Exam: BP (!) 170/81   Pulse (!) 57   Temp 97.9 F (36.6 C) (Oral)   Resp 17   Ht 5\' 5"  (1.651 m)   Wt 72.6 kg   SpO2 100%   BMI 26.63 kg/m  General:   Alert,  Well-developed, well-nourished, pleasant and cooperative in NAD Neck:  Supple; no masses or thyromegaly. No significant cervical adenopathy. Lungs:  Clear throughout to auscultation.   No wheezes, crackles, or rhonchi. No acute distress. Heart:  Regular rate and rhythm; no murmurs, clicks, rubs,  or gallops.  Abdomen: Non-distended, normal bowel sounds.  Soft and nontender without appreciable mass or hepatosplenomegaly.   Impression/Plan: 78 year old lady with cirrhosis well compensated history of gastric ulcer.  Denies dysphagia.  Here for surveillance EGD. The risks, benefits, limitations, alternatives and imponderables have been reviewed with the patient. Potential for esophageal dilation, biopsy, etc. have also been reviewed.  Questions have been answered. All parties agreeable.      Notice: This dictation was prepared with Dragon dictation along with smaller phrase technology. Any transcriptional errors that result from this process are unintentional and may not be corrected upon review.

## 2024-03-11 ENCOUNTER — Encounter (HOSPITAL_COMMUNITY): Payer: Self-pay | Admitting: Internal Medicine

## 2024-05-14 DIAGNOSIS — Z23 Encounter for immunization: Secondary | ICD-10-CM | POA: Diagnosis not present

## 2024-05-16 DIAGNOSIS — Z23 Encounter for immunization: Secondary | ICD-10-CM | POA: Diagnosis not present

## 2024-05-17 ENCOUNTER — Ambulatory Visit (HOSPITAL_COMMUNITY)
Admission: RE | Admit: 2024-05-17 | Discharge: 2024-05-17 | Disposition: A | Discharge status: Home / Self Care | Payer: Medicare Other | Source: Ambulatory Visit | Attending: Urology | Admitting: Urology

## 2024-05-17 ENCOUNTER — Other Ambulatory Visit: Payer: Self-pay

## 2024-05-17 DIAGNOSIS — Z905 Acquired absence of kidney: Secondary | ICD-10-CM | POA: Insufficient documentation

## 2024-05-17 DIAGNOSIS — C642 Malignant neoplasm of left kidney, except renal pelvis: Secondary | ICD-10-CM

## 2024-05-17 DIAGNOSIS — K769 Liver disease, unspecified: Secondary | ICD-10-CM | POA: Diagnosis not present

## 2024-05-17 DIAGNOSIS — N281 Cyst of kidney, acquired: Secondary | ICD-10-CM | POA: Diagnosis not present

## 2024-05-17 MED ORDER — IOHEXOL 300 MG/ML  SOLN
100.0000 mL | Freq: Once | INTRAMUSCULAR | Status: AC | PRN
  Administered 2024-05-17: 100 mL via INTRAVENOUS

## 2024-05-18 LAB — BASIC METABOLIC PANEL WITH GFR
BUN/Creatinine Ratio: 17 (ref 12–28)
BUN: 20 mg/dL (ref 8–27)
CO2: 23 mmol/L (ref 20–29)
Calcium: 9.1 mg/dL (ref 8.7–10.3)
Chloride: 98 mmol/L (ref 96–106)
Creatinine, Ser: 1.17 mg/dL — ABNORMAL HIGH (ref 0.57–1.00)
Glucose: 138 mg/dL — ABNORMAL HIGH (ref 70–99)
Potassium: 4.2 mmol/L (ref 3.5–5.2)
Sodium: 138 mmol/L (ref 134–144)
eGFR: 48 mL/min/{1.73_m2} — ABNORMAL LOW (ref >59)

## 2024-05-22 ENCOUNTER — Ambulatory Visit: Payer: Self-pay | Admitting: Urology

## 2024-05-22 NOTE — Telephone Encounter (Signed)
 Patient is made aware and voiced understanding.

## 2024-05-22 NOTE — Telephone Encounter (Signed)
-----   Message from Lauretta Ponto sent at 05/22/2024  8:47 AM EDT ----- Please inform patient: Debra function stable. No acute findings on CT. Follow up with Dr. Claretta Croft on 06/05/2024 as scheduled.

## 2024-05-24 ENCOUNTER — Telehealth: Payer: Self-pay

## 2024-05-24 LAB — POCT I-STAT CREATININE: Creatinine, Ser: 1.2 mg/dL — ABNORMAL HIGH (ref 0.44–1.00)

## 2024-05-24 NOTE — Telephone Encounter (Signed)
-----   Message from Nurse [facility] sent at 05/23/2024 11:41 AM EDT ----- Regarding: RE: Rescheduled patient Per Mylinda Asa this is not an urgent visit and does not have to be squeezed in. Please scheduled next available with McKenzie or Isa Manuel if needed. ----- Message ----- From: McAdoo, Norma J Sent: 05/23/2024  10:23 AM EDT To: Roselee Cong, LPN Subject: FW: Rescheduled patient                        I cannot put on 6/18 ----- Message ----- From: Audra Blend, CMA Sent: 05/22/2024   5:25 PM EDT To: Orlin Black Subject: Rescheduled patient                            Patient need to be rescheduled on 06/05/2024. Please scheduled patient 06/12/2024. Thanks

## 2024-06-05 ENCOUNTER — Ambulatory Visit: Payer: Medicare Other | Admitting: Urology

## 2024-06-10 DIAGNOSIS — Z6826 Body mass index (BMI) 26.0-26.9, adult: Secondary | ICD-10-CM | POA: Diagnosis not present

## 2024-06-10 DIAGNOSIS — M79604 Pain in right leg: Secondary | ICD-10-CM | POA: Diagnosis not present

## 2024-06-10 DIAGNOSIS — E1122 Type 2 diabetes mellitus with diabetic chronic kidney disease: Secondary | ICD-10-CM | POA: Diagnosis not present

## 2024-06-17 DIAGNOSIS — Z1329 Encounter for screening for other suspected endocrine disorder: Secondary | ICD-10-CM | POA: Diagnosis not present

## 2024-06-17 DIAGNOSIS — R5383 Other fatigue: Secondary | ICD-10-CM | POA: Diagnosis not present

## 2024-06-17 DIAGNOSIS — R5382 Chronic fatigue, unspecified: Secondary | ICD-10-CM | POA: Diagnosis not present

## 2024-06-17 DIAGNOSIS — Z6826 Body mass index (BMI) 26.0-26.9, adult: Secondary | ICD-10-CM | POA: Diagnosis not present

## 2024-06-21 ENCOUNTER — Ambulatory Visit (INDEPENDENT_AMBULATORY_CARE_PROVIDER_SITE_OTHER): Admitting: Urology

## 2024-06-21 ENCOUNTER — Encounter: Payer: Self-pay | Admitting: Urology

## 2024-06-21 VITALS — BP 102/56 | HR 56

## 2024-06-21 DIAGNOSIS — C642 Malignant neoplasm of left kidney, except renal pelvis: Secondary | ICD-10-CM

## 2024-06-21 DIAGNOSIS — Z08 Encounter for follow-up examination after completed treatment for malignant neoplasm: Secondary | ICD-10-CM | POA: Diagnosis not present

## 2024-06-21 DIAGNOSIS — Z85528 Personal history of other malignant neoplasm of kidney: Secondary | ICD-10-CM

## 2024-06-21 LAB — URINALYSIS, ROUTINE W REFLEX MICROSCOPIC
Bilirubin, UA: NEGATIVE
Glucose, UA: NEGATIVE
Ketones, UA: NEGATIVE
Nitrite, UA: NEGATIVE
Protein,UA: NEGATIVE
RBC, UA: NEGATIVE
Specific Gravity, UA: 1.02 (ref 1.005–1.030)
Urobilinogen, Ur: 0.2 mg/dL (ref 0.2–1.0)
pH, UA: 6 (ref 5.0–7.5)

## 2024-06-21 LAB — MICROSCOPIC EXAMINATION: Epithelial Cells (non renal): >10 /HPF — AB (ref 0–10)

## 2024-06-21 NOTE — Patient Instructions (Signed)
 Kidney Cancer  Kidney cancer is a type of cancer in which an abnormal growth of cells (tumor) forms in one or both kidneys. The kidneys filter waste from your blood and make urine. Kidney cancer may spread to other parts of your body. This type of cancer may also be called renal cell carcinoma. What are the causes? The cause of this condition is not known. What increases the risk? You may be more likely to develop kidney cancer if: You are over age 76. You have certain conditions that are passed from parent to child (inherited). These include von Hippel-Lindau disease, tuberous sclerosis, and hereditary papillary renal carcinoma. You smoke or have been exposed to certain chemicals. You have advanced kidney disease, especially if you need to use a machine to clean your blood (dialysis). You are obese. You have high blood pressure (hypertension). You are female. What are the signs or symptoms? At first, kidney cancer may not cause any symptoms. As the cancer grows, symptoms may include: Blood in the urine. Pain in the upper back or abdomen, just below the rib cage. You may feel pain on one or both sides of your body. Tiredness (fatigue). Weight loss that cannot be explained. Fever. How is this diagnosed? This condition may be diagnosed based on: Your symptoms. Your medical history. A physical exam. You may also have tests, such as: Blood and urine tests. X-rays. Imaging tests, such as CT scans, MRIs, and PET scans. Angiogram. This is when dye is injected into your blood to show your blood vessels. Intravenous pyelogram. This is when dye is injected into your blood to show your kidneys and the other organs that help with making and storing urine. Biopsy. This is when a piece of tissue is removed from your kidney and looked at under a microscope. Your cancer will be assessed (staged), based on how severe it is and how much it has spread. How is this treated? Treatment depends on the type  and stage of the cancer. Treatment may include: Surgery to remove: Just the tumor (nephron-sparing surgery). The entire kidney (nephrectomy). The kidney, some of the healthy tissue around it, nearby lymph nodes, and sometimes the adrenal gland (radical nephrectomy). Medicines to: Kill cancer cells (chemotherapy). Help your body's disease-fighting system (immune system) fight cancer cells. This is known as immunotherapy. Radiation therapy. This uses high-energy rays to kill cancer cells. Targeted therapy to only attack genes and proteins that allow a tumor to grow. This type of therapy tries to limit damage to your healthy cells. Cryoablation. This uses gas or liquid to freeze cancer cells. It is sent through a needle. Radiofrequency ablation. This uses high-energy radio waves to destroy cancer cells. It is sent through a needle-like probe. Embolization. This is a procedure to block the artery that supplies blood to the tumor. Follow these instructions at home: Eating and drinking Some of your treatments may affect your appetite and your ability to chew and swallow. If you have problems eating, or if you do not have an appetite, meet with an expert in diet and nutrition (dietitian). If you have side effects that affect eating, it may help to: Eat smaller meals more often. Eat bland or soft foods. Avoid foods that are hot, spicy, or hard to swallow. Drink shakes or supplements that are high in nutrition and calories. Do not drink alcohol. Lifestyle Do not use any products that contain nicotine or tobacco. These products include cigarettes, chewing tobacco, and vaping devices, such as e-cigarettes. If you need  help quitting, ask your health care provider. Get enough sleep. Most adults need 6-8 hours of sleep each night. During treatment, you may need more sleep. General instructions Take over-the-counter and prescription medicines only as told by your health care provider. Consider joining a  support group. This can help you cope with the stress of having kidney cancer. Work with your health care provider to manage any side effects of treatment. Keep all follow-up visits. Your health care provider will want to make sure your treatment is working. Where to find more information American Cancer Society: cancer.org [employer] (NCI): cancer.gov Contact a health care provider if: You bruise or bleed easily. You lose weight without trying. You have new or worse fatigue or weakness. You have a fever. Your pain suddenly increases. You have more blood in your urine. Your skin or the white parts of your eyes turn yellow (jaundice). Get help right away if: You have chest pain. You are short of breath. You have irregular heartbeats (palpitations). These symptoms may be an emergency. Get help right away. Call 911. Do not wait to see if the symptoms will go away. Do not drive yourself to the hospital. This information is not intended to replace advice given to you by your health care provider. Make sure you discuss any questions you have with your health care provider. Document Revised: 05/24/2022 Document Reviewed: 05/24/2022 Elsevier Patient Education  2024 [employer].

## 2024-06-21 NOTE — Progress Notes (Signed)
 06/21/2024 12:13 PM   Debra Vasquez Jan 30, 1946 [id number]  Referring provider: Toribio Jerel MATSU, MD [address] [place],  [place] [address]  Followup RCC  HPI: Debra Vasquez is a [phone] here for followup for left RCC s/p left partial nephrectomy. Pathology T3a. CT shows no evidence of tumor recurrence or metastatic disease. Creatinine 1.17. She drinks 20-30 oz of water  daily. She denies any worsening LUTS.    PMH: Past Medical History:  Diagnosis Date   Arthritis    Cancer (HCC)    CHF (congestive heart failure) (HCC)    Cirrhosis (HCC)    Diabetes mellitus without complication (HCC)    Dysphagia    GERD (gastroesophageal reflux disease)    Gout    Hypertension    Renal mass, left     Surgical History: Past Surgical History:  Procedure Laterality Date   ABDOMINAL HYSTERECTOMY     BIOPSY  12/06/2023   Procedure: BIOPSY;  Surgeon: Debra Lamar HERO, MD;  [facility];  Service: Endoscopy;;   CARPAL TUNNEL RELEASE Left    CATARACT EXTRACTION W/PHACO Right 09/13/2019   Procedure: CATARACT EXTRACTION PHACO AND INTRAOCULAR LENS PLACEMENT RIGHT EYE;  Surgeon: Debra Agent, MD;  [facility]: [facility];  Service: Ophthalmology;  Laterality: Right;  right   CATARACT EXTRACTION W/PHACO Left 09/27/2019   Procedure: CATARACT EXTRACTION PHACO AND INTRAOCULAR LENS PLACEMENT LEFT EYE  (CDE: 8.04);  Surgeon: Debra Agent, MD;  Location: [facility];  Service: Ophthalmology;  Laterality: Left;   ESOPHAGOGASTRODUODENOSCOPY N/A 11/18/2020   Procedure: ESOPHAGOGASTRODUODENOSCOPY (EGD);  Surgeon: Debra Lamar HERO, MD;  [facility];  Service: Endoscopy;  Laterality: N/A;  7:30am   ESOPHAGOGASTRODUODENOSCOPY (EGD) WITH PROPOFOL  N/A 12/06/2023   Procedure: ESOPHAGOGASTRODUODENOSCOPY (EGD) WITH PROPOFOL ;  Surgeon: Debra Lamar HERO, MD;  [facility];  Service: Endoscopy;  Laterality: N/A;  945am, asa 3   ESOPHAGOGASTRODUODENOSCOPY (EGD) WITH PROPOFOL  N/A 03/08/2024   Procedure:  ESOPHAGOGASTRODUODENOSCOPY (EGD) WITH PROPOFOL ;  Surgeon: Debra Lamar HERO, MD;  [facility];  Service: Endoscopy;  Laterality: N/A;  10:00 am, asa 3   MALONEY Vasquez N/A 11/18/2020   Procedure: MALONEY Vasquez;  Surgeon: Debra Lamar HERO, MD;  [facility];  Service: Endoscopy;  Laterality: N/A;   MALONEY Vasquez  12/06/2023   Procedure: Debra Vasquez;  Surgeon: Debra Lamar HERO, MD;  [facility];  Service: Endoscopy;;   ROBOTIC ASSITED PARTIAL NEPHRECTOMY Left 12/07/2021   Procedure: XI ROBOTIC ASSITED PARTIAL NEPHRECTOMY;  Surgeon: Debra Belvie CROME, MD;  [facility];  Service: Urology;  Laterality: Left;   TRIGGER FINGER RELEASE Right    TUBAL LIGATION     WISDOM TOOTH EXTRACTION     XI ROBOTIC ASSISTED VENTRAL HERNIA N/A 07/03/2023   Procedure: XI ROBOTIC ASSISTED VENTRAL HERNIA;  Surgeon: Debra Anes, MD;  Location: [facility];  Service: General;  Laterality: N/A;    Home Medications:  Allergies as of 06/21/2024       Reactions   Gabapentin Other (See Comments)   syncope   Capoten [captopril] Cough   Celexa [citalopram Hydrobromide]    Passed out   Hydrocodone    awake all night, felt terrible   Lexapro [escitalopram Oxalate]    see psychodelic lights   Lipitor [atorvastatin Calcium ] Other (See Comments)   Achy legs   Tramadol    confusion        Medication List        Accurate as of June 21, 2024 12:13 PM. If you have any questions, ask your nurse or doctor.          allopurinol  300 MG tablet Commonly known as: ZYLOPRIM  Take 300 mg by mouth in the morning.   B-12 3000 MCG Caps Take 3,000 mcg by mouth daily.   cloNIDine  0.3 MG tablet Commonly known as: CATAPRES  Take 0.3 mg by mouth 2 (two) times daily.   furosemide 20 MG tablet Commonly known as: LASIX Take 20 mg by mouth daily as needed for edema.   glipiZIDE 5 MG 24 hr tablet Commonly known as: GLUCOTROL XL Take 5 mg by mouth in the morning.   MAG  GLYCINATE PO Take 500 mg by mouth daily.   metFORMIN 500 MG 24 hr tablet Commonly known as: GLUCOPHAGE-XR Take 500 mg by mouth at bedtime.   metoprolol  tartrate 50 MG tablet Commonly known as: LOPRESSOR  Take 50 mg by mouth 2 (two) times daily.   multivitamin with minerals Tabs tablet Take 1 tablet by mouth daily.   pantoprazole  40 MG tablet Commonly known as: PROTONIX  TAKE 1 TABLET(40 MG) BY MOUTH TWICE DAILY BEFORE A MEAL What changed: See the new instructions.   valsartan -hydrochlorothiazide 320-12.5 MG tablet Commonly known as: DIOVAN -HCT Take 1 tablet by mouth daily.   Vitamin D3 50 MCG (2000 UT) Tabs Take 2,000 Units by mouth daily.        Allergies:  Allergies  Allergen Reactions   Gabapentin Other (See Comments)    syncope   Capoten [Captopril] Cough   Celexa [Citalopram Hydrobromide]     Passed out   Hydrocodone     awake all night, felt terrible   Lexapro [Escitalopram Oxalate]     see psychodelic lights   Lipitor [Atorvastatin Calcium ] Other (See Comments)    Achy legs   Tramadol     confusion    Family History: Family History  Problem Relation Age of Onset   Hyperlipidemia Mother    Heart attack Mother    Parkinson's disease Father    Stroke Father 23   Hyperlipidemia Sister    Alcoholism Brother    Diabetes Brother    Heart failure Paternal Grandmother    Colon cancer Neg Hx    Gastric cancer Neg Hx    Esophageal cancer Neg Hx    Liver disease Neg Hx    Pancreatic disease Neg Hx     Social History:  reports that she quit smoking about 38 years ago. Her smoking use included cigarettes. She started smoking about 44 years ago. She has a 3 pack-year smoking history. She has never used smokeless tobacco. She reports that she does not currently use alcohol . She reports that she does not use drugs.  ROS: All other review of systems were reviewed and are negative except what is noted above in HPI  Physical Exam: BP (!) 102/56   Pulse  (!) 56   Constitutional:  Alert and oriented, No acute distress. HEENT: Debra Vasquez AT, moist mucus membranes.  Trachea midline, no masses. Cardiovascular: No clubbing, cyanosis, or edema. Respiratory: Normal respiratory effort, no increased work of breathing. GI: Abdomen is soft, nontender, nondistended, no abdominal masses GU: No CVA tenderness.  Lymph: No cervical or inguinal lymphadenopathy. Skin: No rashes, bruises or suspicious lesions. Neurologic: Grossly intact, no focal deficits, moving all 4 extremities. Psychiatric: Normal mood and affect.  Laboratory Data: Lab Results  Component Value Date   WBC 7.4 03/05/2024   HGB 11.7 (L) 03/05/2024   HCT 36.6 03/05/2024  MCV 84.9 03/05/2024   PLT 148 (L) 03/05/2024    Lab Results  Component Value Date   CREATININE 1.17 (H) 05/17/2024    No results found for: PSA  No results found for: TESTOSTERONE  Lab Results  Component Value Date   HGBA1C 7.1 (H) 11/26/2021    Urinalysis    Component Value Date/Time   COLORURINE YELLOW 01/02/2022 1548   APPEARANCEUR Clear 12/05/2023 1325   LABSPEC 1.020 01/02/2022 1548   PHURINE 5.0 01/02/2022 1548   GLUCOSEU Negative 12/05/2023 1325   HGBUR NEGATIVE 01/02/2022 1548   BILIRUBINUR Negative 12/05/2023 1325   KETONESUR NEGATIVE 01/02/2022 1548   PROTEINUR Negative 12/05/2023 1325   PROTEINUR NEGATIVE 01/02/2022 1548   NITRITE Negative 12/05/2023 1325   NITRITE NEGATIVE 01/02/2022 1548   LEUKOCYTESUR Trace (A) 12/05/2023 1325   LEUKOCYTESUR LARGE (A) 01/02/2022 1548    Lab Results  Component Value Date   LABMICR See below: 12/05/2023   WBCUA 6-10 (A) 12/05/2023   LABEPIT 0-10 12/05/2023   BACTERIA None seen 12/05/2023    Pertinent Imaging: CT 05/17/2024: Images reviewed and discussed with the patient  No results found for this or any previous visit.  No results found for this or any previous visit.  No results found for this or any previous visit.  No results found for  this or any previous visit.  No results found for this or any previous visit.  No results found for this or any previous visit.  No results found for this or any previous visit.  No results found for this or any previous visit.   Assessment & Plan:    1. Renal cell carcinoma, left (HCC) (Primary) Followup 6 months with CXR and CMP - Urinalysis, Routine w reflex microscopic   No follow-ups on file.  Belvie Clara, MD  [facility]

## 2024-06-26 ENCOUNTER — Telehealth: Payer: Self-pay

## 2024-06-26 ENCOUNTER — Telehealth: Payer: Self-pay | Admitting: Urology

## 2024-06-26 NOTE — Telephone Encounter (Signed)
 Patient just got the message that she was to have a KUB before her appointment yesterday, does she need to still get one or is it ok to disregard?

## 2024-06-26 NOTE — Telephone Encounter (Addendum)
 Patient left a voice message 06-26-2024. Has a question regarding something that was ordered.    Called patient back to ask what was ordered. No Answer.  Left a voice message for patient to return call.

## 2024-06-27 NOTE — Telephone Encounter (Signed)
 Yes please inform her to have this done prior to her next follow up along with ordered lab work.  Thanks

## 2024-07-11 DIAGNOSIS — R5382 Chronic fatigue, unspecified: Secondary | ICD-10-CM | POA: Diagnosis not present

## 2024-07-11 DIAGNOSIS — E7849 Other hyperlipidemia: Secondary | ICD-10-CM | POA: Diagnosis not present

## 2024-07-11 DIAGNOSIS — E1122 Type 2 diabetes mellitus with diabetic chronic kidney disease: Secondary | ICD-10-CM | POA: Diagnosis not present

## 2024-07-11 DIAGNOSIS — D649 Anemia, unspecified: Secondary | ICD-10-CM | POA: Diagnosis not present

## 2024-07-11 DIAGNOSIS — N183 Chronic kidney disease, stage 3 unspecified: Secondary | ICD-10-CM | POA: Diagnosis not present

## 2024-07-18 DIAGNOSIS — F331 Major depressive disorder, recurrent, moderate: Secondary | ICD-10-CM | POA: Diagnosis not present

## 2024-07-18 DIAGNOSIS — Z6826 Body mass index (BMI) 26.0-26.9, adult: Secondary | ICD-10-CM | POA: Diagnosis not present

## 2024-07-18 DIAGNOSIS — E782 Mixed hyperlipidemia: Secondary | ICD-10-CM | POA: Diagnosis not present

## 2024-07-18 DIAGNOSIS — I5032 Chronic diastolic (congestive) heart failure: Secondary | ICD-10-CM | POA: Diagnosis not present

## 2024-07-18 DIAGNOSIS — E7849 Other hyperlipidemia: Secondary | ICD-10-CM | POA: Diagnosis not present

## 2024-07-18 DIAGNOSIS — J309 Allergic rhinitis, unspecified: Secondary | ICD-10-CM | POA: Diagnosis not present

## 2024-08-23 ENCOUNTER — Encounter: Payer: Self-pay | Admitting: Gastroenterology

## 2024-08-28 DIAGNOSIS — M65341 Trigger finger, right ring finger: Secondary | ICD-10-CM | POA: Diagnosis not present

## 2024-08-28 DIAGNOSIS — Z6826 Body mass index (BMI) 26.0-26.9, adult: Secondary | ICD-10-CM | POA: Diagnosis not present

## 2024-10-09 ENCOUNTER — Ambulatory Visit (INDEPENDENT_AMBULATORY_CARE_PROVIDER_SITE_OTHER): Admitting: Gastroenterology

## 2024-10-09 ENCOUNTER — Encounter: Payer: Self-pay | Admitting: Gastroenterology

## 2024-10-09 VITALS — BP 128/71 | HR 50 | Temp 97.7°F | Ht 64.0 in | Wt 162.2 lb

## 2024-10-09 DIAGNOSIS — K7460 Unspecified cirrhosis of liver: Secondary | ICD-10-CM

## 2024-10-09 DIAGNOSIS — K219 Gastro-esophageal reflux disease without esophagitis: Secondary | ICD-10-CM

## 2024-10-09 DIAGNOSIS — K869 Disease of pancreas, unspecified: Secondary | ICD-10-CM | POA: Diagnosis not present

## 2024-10-09 NOTE — Progress Notes (Addendum)
 GI Office Note    Referring Provider: Toribio Jerel MATSU, MD Primary Care Physician:  Toribio Jerel MATSU, MD  Primary Gastroenterologist: Ozell Hollingshead, MD   Chief Complaint   Chief Complaint  Patient presents with   Follow-up    Doing well, no issues to discuss.    History of Present Illness   Debra [place]  Debra Vasquez is a 78 y.o. female presenting today for follow-up.  She was last seen in January.  She has a history of GERD, dysphagia, cirrhosis, concern for liver and pancreatic lesions on prior imaging.  She had CT 11/2023, concerning for liver metastasis, was seen by oncology, Dr. Timmy, MRI liver obtained which fortunately was not consistent with metastasis.  She does have liver lesions that need to be monitored.  Discussed the use of AI scribe software for clinical note transcription with the patient, who gave verbal consent to proceed.   She has a history of gastric ulcers, with the last endoscopy in March showing resolution of the ulcer that was previously identified in December. She occasionally takes reflux medication, approximately two tablets every month or so, particularly after consuming foods like pizza that exacerbate her symptoms. She has discontinued regular use of these medications as her symptoms have improved. No NSAIDS.    She has a history of cirrhosis. We have been doing HCC screening while she has had CT imaging for surveillance of renal cell carcinoma. She states she no longer requires CT by urology. She is due for [facility] screening next month. Her liver is best evaluated by MRI as previously CT .    She denies abdominal pain, n/v. BMs regular. No melena, brbpr. Appetite is good. No confusions. No significant edema. Takes diruretcs as needed only.   She noted rectus diastasis after her kidney surgery. She has had right breast lesion seen on CT evaluated and has been documented as benign stable breast mass by mammography. PCP aware and had diagnostic study completed.     MELD 3.0: not recently calculated due to lack of INR US : 2 LI-RADS category 3 lesions noted on MRI 12/2023, seen on CT 04/2024 (but best evaluated by MRI), reportedly stable since 2022 but not mentioned on original MRI report in 2022 AFP: 7.6 (03/05/2024) Hep A/B vaccination: completing EGD: March 2025, portal hypertensive gastropathy BB: no Ascites/peripheral edema: no Diuretics: lasix 20 Paracentesis: no History of SBP: no Encephalopathy:  no  Prior Data   CT with and without contrast May 2025: IMPRESSION: 1. Similar surgical changes of partial left nephrectomy without evidence of recurrent or metastatic disease in the abdomen. 2. Cirrhotic hepatic morphology with stable LI-RADS 3 hepatic lesions better evaluated on dedicated MRI January 04, 2024. No new suspicious hepatic lesion. 3. Stable 2.9 cm lesion in the right breast. Recommend correlation with mammography. 4.  Aortic Atherosclerosis (ICD10-I70.0).   MRI liver with and without contrast 12/2023: IMPRESSION: 1. No evidence of liver metastatic disease. Reported hypoenhancing liver lesions on portal venous phase of prior CT most likely reflect parenchymal fibrosis and nodularity in the setting of cirrhosis. 2. Hypoenhancing lesion of the posterior liver dome, hepatic segment VII measuring 0.8 cm, unchanged compared to prior MR dated 2022. This remains a LI-RADS category 3 lesion in the setting of cirrhosis, established stability reassuring for benign nature. 3. Unchanged arterially hyperenhancing lesion of the anterior inferior left lobe of the liver, hepatic segment III, measuring 0.6 cm, without washout or capsular enhancement. This remains a LI-RADS category 3 lesion in the setting  of cirrhosis, established stability reassuring for benign nature. 4. Mild splenomegaly. 5. Unchanged appearance of partial superior pole left nephrectomy. No suspicious soft tissue or contrast enhancement. 6. Mucosal thickening and  hyperenhancement of the pylorus and duodenal bulb, consistent with gastritis and duodenitis.   CT Abd with and without contrast 11/2023: IMPRESSION: *Since the prior study, there are innumerable, subcentimeter sized, ill-defined, hypoattenuating lesions throughout the liver, highly concerning for metastases. *No other metastatic renal carcinoma seen within the abdomen or pelvis. *Indeterminate but stable 2.0 x 2.9 cm solid lesion in the inferior right breast. Correlate clinically. *Multiple other nonacute observations, as described above. *stable right hepatic dome fluid attenuation structure.  *stable 6mm arterial hyperenchancing lesion in left hepatic lobe, segment 4B, possible flash filling hemangioma.  CT Abd with and without contrast 02/2022: IMPRESSION: 1. Evolving changes of partial left nephrectomy without evidence of recurrent or metastatic disease. 2. Cirrhosis. 6 mm arterial phase enhancing nodule in the left hepatic lobe, worrisome for HCC. Consider MR abdomen without and with contrast in further evaluation, as clinically indicated. These results will be called to the ordering clinician or representative by the Radiologist Assistant, and communication documented in the PACS or [employer]. 3. 6 mm low-attenuation lesion in the head/uncinate process of the pancreas. Follow-up CT abdomen without and with contrast in 2 years is recommended as a cystic pancreatic neoplasm cannot be definitively excluded. This recommendation follows ACR consensus guidelines: Management of Incidental Pancreatic Cysts: A White Paper of the ACR Incidental Findings Committee. J Am Coll Radiol 2017;14:911-923. Trace perihepatic fluid. 4. Aortic atherosclerosis (ICD10-I70.0). Coronary artery calcification.   EGD 02/2024: -normal esophagus - Portal hypertensive gastropathy.  Deformity of the antrum.  Previously noted gastric ulcer completely healed - Normal duodenal bulb and second portion  of duodenum - Continue to avoid all NSAIDs  EGD 11/2023: -normal esophagus s/p dilation -portal hypertensive gastropathy -antral ulcer, benign bx, no h.pylori  EGD November 2021: - Normal esophagus. Dilated. Query occult mucosal ring at the GE junction?disrupted with dilation - Small hiatal hernia. - Normal duodenal bulb and second portion of the duodenum. - No specimens collected.   Colonoscopy February 2015: Dr. Maranda -Diverticula in the sigmoid colon -Next colonoscopy February 2025   Medications   Current Outpatient Medications  Medication Sig Dispense Refill   allopurinol  (ZYLOPRIM ) 300 MG tablet Take 300 mg by mouth in the morning.      Cholecalciferol (VITAMIN D3) 50 MCG (2000 UT) TABS Take 2,000 Units by mouth daily.     cloNIDine  (CATAPRES ) 0.3 MG tablet Take 0.3 mg by mouth 2 (two) times daily.      Cyanocobalamin (B-12) 3000 MCG CAPS Take 3,000 mcg by mouth daily.     furosemide (LASIX) 20 MG tablet Take 20 mg by mouth daily as needed for edema.      glipiZIDE (GLUCOTROL XL) 5 MG 24 hr tablet Take 5 mg by mouth in the morning.      Magnesium  Bisglycinate (MAG GLYCINATE PO) Take 500 mg by mouth daily.     metoprolol  tartrate (LOPRESSOR ) 50 MG tablet Take 50 mg by mouth 2 (two) times daily.     pantoprazole  (PROTONIX ) 40 MG tablet Take 1 tablet (40 mg total) by mouth 2 (two) times daily as needed.     valsartan -hydrochlorothiazide (DIOVAN -HCT) 320-12.5 MG tablet Take 1 tablet by mouth daily.     No current facility-administered medications for this visit.    Allergies   Allergies as of 10/09/2024 - Review Complete 10/09/2024  Allergen Reaction Noted   Gabapentin Other (See Comments) 12/17/2018   Capoten [captopril] Cough 09/10/2019   Celexa [citalopram hydrobromide]  09/10/2019   Hydrocodone  11/11/2020   Lexapro [escitalopram oxalate]  09/10/2019   Lipitor [atorvastatin calcium ] Other (See Comments) 09/13/2019   Tramadol  09/10/2019     Review of Systems    General: Negative for anorexia, weight loss, fever, chills, fatigue, weakness. ENT: Negative for hoarseness, difficulty swallowing , nasal congestion. CV: Negative for chest pain, angina, palpitations, dyspnea on exertion, peripheral edema.  Respiratory: Negative for dyspnea at rest, dyspnea on exertion, cough, sputum, wheezing.  GI: See history of present illness. GU:  Negative for dysuria, hematuria, urinary incontinence, urinary frequency, nocturnal urination.  Endo: Negative for unusual weight change.     Physical Exam   BP 128/71 (BP Location: Right Arm, Patient Position: Sitting, Cuff Size: Normal)   Pulse (!) 50   Temp 97.7 F (36.5 C) (Oral)   Ht 5' 4 (1.626 m)   Wt 162 lb 3.2 oz (73.6 kg)   SpO2 96%   BMI 27.84 kg/m    General: Well-nourished, well-developed in no acute distress.  Eyes: No icterus. Mouth: Oropharyngeal mucosa moist and pink   Lungs: Clear to auscultation bilaterally.  Heart: Regular rate and rhythm, no murmurs rubs or gallops.  Abdomen: Bowel sounds are normal, nontender, nondistended, no hepatosplenomegaly or masses,  no abdominal bruits or hernia , no rebound or guarding. Rectus diastasis Rectal: not performed Extremities: No lower extremity edema. No clubbing or deformities. Neuro: Alert and oriented x 4   Skin: Warm and dry, no jaundice.   Psych: Alert and cooperative, normal mood and affect.  Labs   02/2024: AFP 7.6, Cre 1.15, Hgb 11.7, MCV 84.9, Plt 148 Imaging Studies   No results found.  Assessment/Plan:   Cirrhosis, well compensated: found incidentally on previous imaging.  Has portal hypertensive gastropathy on EGD. No varices.  -two liver lesions on MRI, LI-RADS category 3 better seen on MRI. Will need ongoing surveillance. Last imaging by CT in May. Last MRI 12/2023.  -update labs for MELD 3.0 and AFP -check vaccination response to Hep A/B with labs after next visit -ov in six months   Gastroesophageal reflux  disease Gastroesophageal reflux disease, managed with as-needed medication. Symptoms occur infrequently, typically after consuming certain foods like pizza. - Use reflux medication as needed  - Reinforced anti-reflux measures  Pancreatic lesion: -seen on CT in 02/2022 but not mentioned on subsequent studies. MRI in 12/2023, pancreas reported to be normal.    Sonny RAMAN. Ezzard, MHS, PA-C [facility]

## 2024-10-09 NOTE — Patient Instructions (Signed)
 Complete your labs at [facility], [address] [place].  We will be in touch with results and recommendations.

## 2024-10-10 LAB — CBC WITH DIFFERENTIAL/PLATELET
Basophils Absolute: 0.1 x10E3/uL (ref 0.0–0.2)
Basos: 1 %
EOS (ABSOLUTE): 0.2 x10E3/uL (ref 0.0–0.4)
Eos: 2 %
Hematocrit: 40 % (ref 34.0–46.6)
Hemoglobin: 13.2 g/dL (ref 11.1–15.9)
Immature Grans (Abs): 0 x10E3/uL (ref 0.0–0.1)
Immature Granulocytes: 0 %
Lymphocytes Absolute: 2.8 x10E3/uL (ref 0.7–3.1)
Lymphs: 36 %
MCH: 28.4 pg (ref 26.6–33.0)
MCHC: 33 g/dL (ref 31.5–35.7)
MCV: 86 fL (ref 79–97)
Monocytes Absolute: 0.5 x10E3/uL (ref 0.1–0.9)
Monocytes: 6 %
Neutrophils Absolute: 4.3 x10E3/uL (ref 1.4–7.0)
Neutrophils: 55 %
Platelets: 135 x10E3/uL — ABNORMAL LOW (ref 150–450)
RBC: 4.65 x10E6/uL (ref 3.77–5.28)
RDW: 14.9 % (ref 11.7–15.4)
WBC: 7.8 x10E3/uL (ref 3.4–10.8)

## 2024-10-10 LAB — AFP TUMOR MARKER: AFP, Serum, Tumor Marker: 8.3 ng/mL (ref 0.0–9.2)

## 2024-10-10 LAB — COMPREHENSIVE METABOLIC PANEL WITH GFR
ALT: 34 IU/L — ABNORMAL HIGH (ref 0–32)
AST: 51 IU/L — ABNORMAL HIGH (ref 0–40)
Albumin: 4.1 g/dL (ref 3.8–4.8)
Alkaline Phosphatase: 110 IU/L (ref 49–135)
BUN/Creatinine Ratio: 18 (ref 12–28)
BUN: 22 mg/dL (ref 8–27)
Bilirubin Total: 0.7 mg/dL (ref 0.0–1.2)
CO2: 25 mmol/L (ref 20–29)
Calcium: 9.2 mg/dL (ref 8.7–10.3)
Chloride: 96 mmol/L (ref 96–106)
Creatinine, Ser: 1.21 mg/dL — ABNORMAL HIGH (ref 0.57–1.00)
Globulin, Total: 2.4 g/dL (ref 1.5–4.5)
Glucose: 145 mg/dL — ABNORMAL HIGH (ref 70–99)
Potassium: 4.1 mmol/L (ref 3.5–5.2)
Sodium: 134 mmol/L (ref 134–144)
Total Protein: 6.5 g/dL (ref 6.0–8.5)
eGFR: 46 mL/min/1.73 — ABNORMAL LOW (ref >59)

## 2024-10-10 LAB — PROTIME-INR
INR: 1 (ref 0.9–1.2)
Prothrombin Time: 11.2 s (ref 9.1–12.0)

## 2024-10-20 ENCOUNTER — Ambulatory Visit: Payer: Self-pay | Admitting: Gastroenterology

## 2024-10-23 ENCOUNTER — Other Ambulatory Visit: Payer: Self-pay | Admitting: *Deleted

## 2024-10-23 DIAGNOSIS — K7460 Unspecified cirrhosis of liver: Secondary | ICD-10-CM

## 2024-10-29 ENCOUNTER — Ambulatory Visit (HOSPITAL_COMMUNITY)
Admission: RE | Admit: 2024-10-29 | Discharge: 2024-10-29 | Disposition: A | Discharge status: Home / Self Care | Source: Ambulatory Visit | Attending: Gastroenterology | Admitting: Gastroenterology

## 2024-10-29 DIAGNOSIS — K7460 Unspecified cirrhosis of liver: Secondary | ICD-10-CM | POA: Diagnosis not present

## 2024-10-29 DIAGNOSIS — K769 Liver disease, unspecified: Secondary | ICD-10-CM | POA: Diagnosis not present

## 2024-10-29 DIAGNOSIS — R932 Abnormal findings on diagnostic imaging of liver and biliary tract: Secondary | ICD-10-CM | POA: Diagnosis not present

## 2024-10-29 DIAGNOSIS — K7689 Other specified diseases of liver: Secondary | ICD-10-CM | POA: Diagnosis not present

## 2024-11-05 ENCOUNTER — Ambulatory Visit: Payer: Self-pay | Admitting: Gastroenterology

## 2024-11-11 ENCOUNTER — Other Ambulatory Visit: Payer: Self-pay | Admitting: *Deleted

## 2024-11-11 DIAGNOSIS — K769 Liver disease, unspecified: Secondary | ICD-10-CM

## 2024-11-11 NOTE — Telephone Encounter (Signed)
 Pt informed that MRI is scheduled for Wednesday 11/13/24, arrive at 4:30 pm to check in NPO after 1 pm prior to scan.

## 2024-11-12 NOTE — Addendum Note (Signed)
 Addended by: GAYLENE MADELIN CROME on: 11/12/2024 08:49 AM   Modules accepted: Orders

## 2024-11-12 NOTE — Addendum Note (Signed)
 Addended by: GAYLENE MADELIN CROME on: 11/12/2024 08:36 AM   Modules accepted: Orders

## 2024-11-13 ENCOUNTER — Ambulatory Visit (HOSPITAL_COMMUNITY)
Admission: RE | Admit: 2024-11-13 | Discharge: 2024-11-13 | Disposition: A | Discharge status: Home / Self Care | Source: Ambulatory Visit | Attending: Gastroenterology | Admitting: Gastroenterology

## 2024-11-13 ENCOUNTER — Ambulatory Visit (HOSPITAL_COMMUNITY)

## 2024-11-13 DIAGNOSIS — E7849 Other hyperlipidemia: Secondary | ICD-10-CM | POA: Diagnosis not present

## 2024-11-13 DIAGNOSIS — N1832 Chronic kidney disease, stage 3b: Secondary | ICD-10-CM | POA: Diagnosis not present

## 2024-11-13 DIAGNOSIS — K769 Liver disease, unspecified: Secondary | ICD-10-CM | POA: Insufficient documentation

## 2024-11-13 DIAGNOSIS — E1122 Type 2 diabetes mellitus with diabetic chronic kidney disease: Secondary | ICD-10-CM | POA: Diagnosis not present

## 2024-11-13 DIAGNOSIS — Z9049 Acquired absence of other specified parts of digestive tract: Secondary | ICD-10-CM | POA: Diagnosis not present

## 2024-11-13 MED ORDER — GADOBUTROL 1 MMOL/ML IV SOLN
7.0000 mL | Freq: Once | INTRAVENOUS | Status: AC | PRN
  Administered 2024-11-13: 7 mL via INTRAVENOUS

## 2024-11-19 DIAGNOSIS — M1000 Idiopathic gout, unspecified site: Secondary | ICD-10-CM | POA: Diagnosis not present

## 2024-11-19 DIAGNOSIS — I5032 Chronic diastolic (congestive) heart failure: Secondary | ICD-10-CM | POA: Diagnosis not present

## 2024-11-19 DIAGNOSIS — E1122 Type 2 diabetes mellitus with diabetic chronic kidney disease: Secondary | ICD-10-CM | POA: Diagnosis not present

## 2024-11-19 DIAGNOSIS — J309 Allergic rhinitis, unspecified: Secondary | ICD-10-CM | POA: Diagnosis not present

## 2024-11-19 DIAGNOSIS — I10 Essential (primary) hypertension: Secondary | ICD-10-CM | POA: Diagnosis not present

## 2024-11-19 DIAGNOSIS — N1832 Chronic kidney disease, stage 3b: Secondary | ICD-10-CM | POA: Diagnosis not present

## 2024-11-19 DIAGNOSIS — M8580 Other specified disorders of bone density and structure, unspecified site: Secondary | ICD-10-CM | POA: Diagnosis not present

## 2024-11-19 DIAGNOSIS — Z6827 Body mass index (BMI) 27.0-27.9, adult: Secondary | ICD-10-CM | POA: Diagnosis not present

## 2024-11-19 DIAGNOSIS — K7581 Nonalcoholic steatohepatitis (NASH): Secondary | ICD-10-CM | POA: Diagnosis not present

## 2024-11-19 DIAGNOSIS — F331 Major depressive disorder, recurrent, moderate: Secondary | ICD-10-CM | POA: Diagnosis not present

## 2024-11-25 ENCOUNTER — Ambulatory Visit: Payer: Self-pay | Admitting: Gastroenterology

## 2024-11-25 DIAGNOSIS — K7460 Unspecified cirrhosis of liver: Secondary | ICD-10-CM

## 2024-12-02 NOTE — Progress Notes (Signed)
 NIC'd for MRI in 04/2025

## 2024-12-04 ENCOUNTER — Ambulatory Visit (HOSPITAL_COMMUNITY)
Admission: RE | Admit: 2024-12-04 | Discharge: 2024-12-04 | Disposition: A | Discharge status: Home / Self Care | Source: Ambulatory Visit | Attending: Urology | Admitting: Urology

## 2024-12-04 DIAGNOSIS — R9389 Abnormal findings on diagnostic imaging of other specified body structures: Secondary | ICD-10-CM | POA: Diagnosis not present

## 2024-12-04 DIAGNOSIS — C642 Malignant neoplasm of left kidney, except renal pelvis: Secondary | ICD-10-CM | POA: Insufficient documentation

## 2024-12-23 ENCOUNTER — Other Ambulatory Visit

## 2024-12-23 ENCOUNTER — Other Ambulatory Visit: Payer: Self-pay

## 2024-12-23 DIAGNOSIS — K7460 Unspecified cirrhosis of liver: Secondary | ICD-10-CM

## 2024-12-23 DIAGNOSIS — C642 Malignant neoplasm of left kidney, except renal pelvis: Secondary | ICD-10-CM

## 2024-12-24 LAB — COMPREHENSIVE METABOLIC PANEL WITH GFR
ALT: 31 IU/L (ref 0–32)
AST: 46 IU/L — ABNORMAL HIGH (ref 0–40)
Albumin: 4.2 g/dL (ref 3.8–4.8)
Alkaline Phosphatase: 98 IU/L (ref 49–135)
BUN/Creatinine Ratio: 17 (ref 12–28)
BUN: 18 mg/dL (ref 8–27)
Bilirubin Total: 0.9 mg/dL (ref 0.0–1.2)
CO2: 24 mmol/L (ref 20–29)
Calcium: 9.4 mg/dL (ref 8.7–10.3)
Chloride: 96 mmol/L (ref 96–106)
Creatinine, Ser: 1.09 mg/dL — ABNORMAL HIGH (ref 0.57–1.00)
Globulin, Total: 1.9 g/dL (ref 1.5–4.5)
Glucose: 212 mg/dL — ABNORMAL HIGH (ref 70–99)
Potassium: 4.1 mmol/L (ref 3.5–5.2)
Sodium: 137 mmol/L (ref 134–144)
Total Protein: 6.1 g/dL (ref 6.0–8.5)
eGFR: 53 mL/min/1.73 — ABNORMAL LOW

## 2025-01-01 ENCOUNTER — Ambulatory Visit: Admitting: Urology

## 2025-01-01 VITALS — BP 125/66 | HR 55

## 2025-01-01 DIAGNOSIS — C642 Malignant neoplasm of left kidney, except renal pelvis: Secondary | ICD-10-CM

## 2025-01-01 LAB — URINALYSIS, ROUTINE W REFLEX MICROSCOPIC
Bilirubin, UA: NEGATIVE
Glucose, UA: NEGATIVE
Ketones, UA: NEGATIVE
Nitrite, UA: NEGATIVE
Protein,UA: NEGATIVE
RBC, UA: NEGATIVE
Specific Gravity, UA: 1.025 (ref 1.005–1.030)
Urobilinogen, Ur: 0.2 mg/dL (ref 0.2–1.0)
pH, UA: 6 (ref 5.0–7.5)

## 2025-01-01 LAB — MICROSCOPIC EXAMINATION: Epithelial Cells (non renal): >10 /HPF — AB (ref 0–10)

## 2025-01-01 NOTE — Progress Notes (Signed)
 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 Creatinine 1.09.  CXR showed no evidence of metastatic disease. MRI 10/2024 showed no evidence of metastatic disease.    PMH: Past Medical History:  Diagnosis Date   Arthritis    Cancer (HCC)    CHF (congestive heart failure) (HCC)    Cirrhosis (HCC)    Diabetes mellitus without complication (HCC)    Dysphagia    GERD (gastroesophageal reflux disease)    Gout    Hypertension    Renal mass, left     Surgical History: Past Surgical History:  Procedure Laterality Date   ABDOMINAL HYSTERECTOMY     BIOPSY  12/06/2023   Procedure: BIOPSY;  Surgeon: Shaaron Lamar HERO, MD;  [facility];  Service: Endoscopy;;   CARPAL TUNNEL RELEASE Left    CATARACT EXTRACTION W/PHACO Right 09/13/2019   Procedure: CATARACT EXTRACTION PHACO AND INTRAOCULAR LENS PLACEMENT RIGHT EYE;  Surgeon: Harrie Agent, MD;  Location: [facility];  Service: Ophthalmology;  Laterality: Right;  right   CATARACT EXTRACTION W/PHACO Left 09/27/2019   Procedure: CATARACT EXTRACTION PHACO AND INTRAOCULAR LENS PLACEMENT LEFT EYE  (CDE: 8.04);  Surgeon: Harrie Agent, MD;  Location: [facility];  Service: Ophthalmology;  Laterality: Left;   ESOPHAGOGASTRODUODENOSCOPY N/A 11/18/2020   Procedure: ESOPHAGOGASTRODUODENOSCOPY (EGD);  Surgeon: Shaaron Lamar HERO, MD;  [facility];  Service: Endoscopy;  Laterality: N/A;  7:30am   ESOPHAGOGASTRODUODENOSCOPY (EGD) WITH PROPOFOL  N/A 12/06/2023   Procedure: ESOPHAGOGASTRODUODENOSCOPY (EGD) WITH PROPOFOL ;  Surgeon: Shaaron Lamar HERO, MD;  [facility]: [facility];  Service: Endoscopy;  Laterality: N/A;  945am, asa 3   ESOPHAGOGASTRODUODENOSCOPY (EGD) WITH PROPOFOL  N/A 03/08/2024   Procedure: ESOPHAGOGASTRODUODENOSCOPY (EGD)  WITH PROPOFOL ;  Surgeon: Shaaron Lamar HERO, MD;  [facility]: [facility];  Service: Endoscopy;  Laterality: N/A;  10:00 am, asa 3   MALONEY DILATION N/A 11/18/2020   Procedure: MALONEY DILATION;  Surgeon: Shaaron Lamar HERO, MD;  [facility]: [facility];  Service: Endoscopy;  Laterality: N/A;   MALONEY DILATION  12/06/2023   Procedure: AGAPITO DILATION;  Surgeon: Shaaron Lamar HERO, MD;  [facility]: [facility];  Service: Endoscopy;;   ROBOTIC ASSITED PARTIAL NEPHRECTOMY Left 12/07/2021   Procedure: XI ROBOTIC ASSITED PARTIAL NEPHRECTOMY;  Surgeon: Sherrilee Belvie CROME, MD;  Location: [facility];  Service: Urology;  Laterality: Left;   TRIGGER FINGER RELEASE Right    TUBAL LIGATION     WISDOM TOOTH EXTRACTION     XI ROBOTIC ASSISTED VENTRAL HERNIA N/A 07/03/2023   Procedure: XI ROBOTIC ASSISTED VENTRAL HERNIA;  Surgeon: Mavis Anes, MD;  Location: [facility];  Service: General;  Laterality: N/A;    Home Medications:  Allergies as of 01/01/2025       Reactions   Gabapentin Other (See Comments)   syncope   Capoten [captopril] Cough   Celexa [citalopram Hydrobromide]    Passed out   Hydrocodone    awake all night, felt terrible   Lexapro [escitalopram Oxalate]    see psychodelic lights   Lipitor [atorvastatin Calcium ] Other (See Comments)   Achy legs   Tramadol    confusion        Medication List        Accurate as of January 01, 2025 10:40 AM. If you  have any questions, ask your nurse or doctor.          allopurinol  300 MG tablet Commonly known as: ZYLOPRIM  Take 300 mg by mouth in the morning.   B-12 3000 MCG Caps Take 3,000 mcg by mouth daily.   cloNIDine  0.3 MG tablet Commonly known as: CATAPRES  Take 0.3 mg by mouth 2 (two) times daily.   furosemide 20 MG tablet Commonly known as: LASIX Take 20 mg by mouth daily as needed for edema.   glipiZIDE 5 MG 24 hr tablet Commonly known as: GLUCOTROL XL Take 5 mg by mouth in the morning.   MAG GLYCINATE PO Take 500 mg by mouth  daily.   metoprolol  tartrate 50 MG tablet Commonly known as: LOPRESSOR  Take 50 mg by mouth 2 (two) times daily.   pantoprazole  40 MG tablet Commonly known as: PROTONIX  Take 1 tablet (40 mg total) by mouth 2 (two) times daily as needed.   valsartan -hydrochlorothiazide 320-12.5 MG tablet Commonly known as: DIOVAN -HCT Take 1 tablet by mouth daily.   Vitamin D3 50 MCG (2000 UT) Tabs Take 2,000 Units by mouth daily.        Allergies: Allergies[1]  Family History: Family History  Problem Relation Age of Onset   Hyperlipidemia Mother    Heart attack Mother    Parkinson's disease Father    Stroke Father 14   Hyperlipidemia Sister    Alcoholism Brother    Diabetes Brother    Heart failure Paternal Grandmother    Colon cancer Neg Hx    Gastric cancer Neg Hx    Esophageal cancer Neg Hx    Liver disease Neg Hx    Pancreatic disease Neg Hx     Social History:  reports that she quit smoking about 39 years ago. Her smoking use included cigarettes. She started smoking about 45 years ago. She has a 3 pack-year smoking history. She has never used smokeless tobacco. She reports that she does not currently use alcohol . She reports that she does not use drugs.  ROS: All other review of systems were reviewed and are negative except what is noted above in HPI  Physical Exam: BP 125/66   Pulse (!) 55   Constitutional:  Alert and oriented, No acute distress. HEENT: [place] AT, moist mucus membranes.  Trachea midline, no masses. Cardiovascular: No clubbing, cyanosis, or edema. Respiratory: Normal respiratory effort, no increased work of breathing. GI: Abdomen is soft, nontender, nondistended, no abdominal masses GU: No CVA tenderness.  Lymph: No cervical or inguinal lymphadenopathy. Skin: No rashes, bruises or suspicious lesions. Neurologic: Grossly intact, no focal deficits, moving all 4 extremities. Psychiatric: Normal mood and affect.  Laboratory Data: Lab Results  Component Value  Date   WBC 7.8 10/09/2024   HGB 13.2 10/09/2024   HCT 40.0 10/09/2024   MCV 86 10/09/2024   PLT 135 (L) 10/09/2024    Lab Results  Component Value Date   CREATININE 1.09 (H) 12/23/2024    No results found for: PSA  No results found for: TESTOSTERONE  Lab Results  Component Value Date   HGBA1C 7.1 (H) 11/26/2021    Urinalysis    Component Value Date/Time   COLORURINE YELLOW 01/02/2022 1548   APPEARANCEUR Clear 06/21/2024 1204   LABSPEC 1.020 01/02/2022 1548   PHURINE 5.0 01/02/2022 1548   GLUCOSEU Negative 06/21/2024 1204   HGBUR NEGATIVE 01/02/2022 1548   BILIRUBINUR Negative 06/21/2024 1204   KETONESUR NEGATIVE 01/02/2022 1548   PROTEINUR Negative 06/21/2024 1204   PROTEINUR  NEGATIVE 01/02/2022 1548   NITRITE Negative 06/21/2024 1204   NITRITE NEGATIVE 01/02/2022 1548   LEUKOCYTESUR 2+ (A) 06/21/2024 1204   LEUKOCYTESUR LARGE (A) 01/02/2022 1548    Lab Results  Component Value Date   LABMICR See below: 06/21/2024   WBCUA 6-10 (A) 06/21/2024   LABEPIT >10 (A) 06/21/2024   BACTERIA Few (A) 06/21/2024    Pertinent Imaging: MRI 11/13/2024: Images reviewed and discussed with the patient  No results found for this or any previous visit.  No results found for this or any previous visit.  No results found for this or any previous visit.  No results found for this or any previous visit.  No results found for this or any previous visit.  No results found for this or any previous visit.  No results found for this or any previous visit.  No results found for this or any previous visit.   Assessment & Plan:    1. Renal cell carcinoma, left (HCC) (Primary) Followup 1 year with CMP and CXR - Urinalysis, Routine w reflex microscopic   No follow-ups on file.  Belvie Clara, MD  [facility] [place]       [1]  Allergies Allergen Reactions   Gabapentin Other (See Comments)    syncope   Capoten [Captopril] Cough   Celexa  [Citalopram Hydrobromide]     Passed out   Hydrocodone     awake all night, felt terrible   Lexapro [Escitalopram Oxalate]     see psychodelic lights   Lipitor [Atorvastatin Calcium ] Other (See Comments)    Achy legs   Tramadol     confusion   "

## 2025-01-02 LAB — COMPLETE METABOLIC PANEL WITHOUT GFR
AG Ratio: 2 (calc) (ref 1.0–2.5)
ALT: 27 U/L (ref 6–29)
AST: 36 U/L — ABNORMAL HIGH (ref 10–35)
Albumin: 4.1 g/dL (ref 3.6–5.1)
Alkaline phosphatase (APISO): 95 U/L (ref 37–153)
BUN/Creatinine Ratio: 25 (calc) — ABNORMAL HIGH (ref 6–22)
BUN: 27 mg/dL — ABNORMAL HIGH (ref 7–25)
CO2: 30 mmol/L (ref 20–32)
Calcium: 9 mg/dL (ref 8.6–10.4)
Chloride: 98 mmol/L (ref 98–110)
Creat: 1.1 mg/dL — ABNORMAL HIGH (ref 0.60–1.00)
Globulin: 2.1 g/dL (ref 1.9–3.7)
Glucose, Bld: 266 mg/dL — ABNORMAL HIGH (ref 65–99)
Potassium: 3.7 mmol/L (ref 3.5–5.3)
Sodium: 136 mmol/L (ref 135–146)
Total Bilirubin: 0.7 mg/dL (ref 0.2–1.2)
Total Protein: 6.2 g/dL (ref 6.1–8.1)

## 2025-01-06 LAB — AFP TUMOR MARKER: AFP-Tumor Marker: 8.6 ng/mL — ABNORMAL HIGH

## 2025-01-07 ENCOUNTER — Encounter: Payer: Self-pay | Admitting: Urology

## 2025-01-07 NOTE — Patient Instructions (Signed)
 Kidney Cancer  Kidney cancer is a type of cancer in which an abnormal growth of cells (tumor) forms in one or both kidneys. The kidneys filter waste from your blood and make urine. Kidney cancer may spread to other parts of your body. This type of cancer may also be called renal cell carcinoma. What are the causes? The cause of this condition is not known. What increases the risk? You may be more likely to develop kidney cancer if: You are over age 77. You have certain conditions that are passed from parent to child (inherited). These include von Hippel-Lindau disease, tuberous sclerosis, and hereditary papillary renal carcinoma. You smoke or have been exposed to certain chemicals. You have advanced kidney disease, especially if you need to use a machine to clean your blood (dialysis). You are obese. You have high blood pressure (hypertension). You are female. What are the signs or symptoms? At first, kidney cancer may not cause any symptoms. As the cancer grows, symptoms may include: Blood in the urine. Pain in the upper back or abdomen, just below the rib cage. You may feel pain on one or both sides of your body. Tiredness (fatigue). Weight loss that cannot be explained. Fever. How is this diagnosed? This condition may be diagnosed based on: Your symptoms. Your medical history. A physical exam. You may also have tests, such as: Blood and urine tests. X-rays. Imaging tests, such as CT scans, MRIs, and PET scans. Angiogram. This is when dye is injected into your blood to show your blood vessels. Intravenous pyelogram. This is when dye is injected into your blood to show your kidneys and the other organs that help with making and storing urine. Biopsy. This is when a piece of tissue is removed from your kidney and looked at under a microscope. Your cancer will be assessed (staged), based on how severe it is and how much it has spread. How is this treated? Treatment depends on the type  and stage of the cancer. Treatment may include: Surgery to remove: Just the tumor (nephron-sparing surgery). The entire kidney (nephrectomy). The kidney, some of the healthy tissue around it, nearby lymph nodes, and sometimes the adrenal gland (radical nephrectomy). Medicines to: Kill cancer cells (chemotherapy). Help your body's disease-fighting system (immune system) fight cancer cells. This is known as immunotherapy. Radiation therapy. This uses high-energy rays to kill cancer cells. Targeted therapy to only attack genes and proteins that allow a tumor to grow. This type of therapy tries to limit damage to your healthy cells. Cryoablation. This uses gas or liquid to freeze cancer cells. It is sent through a needle. Radiofrequency ablation. This uses high-energy radio waves to destroy cancer cells. It is sent through a needle-like probe. Embolization. This is a procedure to block the artery that supplies blood to the tumor. Follow these instructions at home: Eating and drinking Some of your treatments may affect your appetite and your ability to chew and swallow. If you have problems eating, or if you do not have an appetite, meet with an expert in diet and nutrition (dietitian). If you have side effects that affect eating, it may help to: Eat smaller meals more often. Eat bland or soft foods. Avoid foods that are hot, spicy, or hard to swallow. Drink shakes or supplements that are high in nutrition and calories. Do not drink alcohol. Lifestyle Do not use any products that contain nicotine or tobacco. These products include cigarettes, chewing tobacco, and vaping devices, such as e-cigarettes. If you need  help quitting, ask your health care provider. Get enough sleep. Most adults need 6-8 hours of sleep each night. During treatment, you may need more sleep. General instructions Take over-the-counter and prescription medicines only as told by your health care provider. Consider joining a  support group. This can help you cope with the stress of having kidney cancer. Work with your health care provider to manage any side effects of treatment. Keep all follow-up visits. Your health care provider will want to make sure your treatment is working. Where to find more information American Cancer Society: cancer.org [employer] (NCI): cancer.gov Contact a health care provider if: You bruise or bleed easily. You lose weight without trying. You have new or worse fatigue or weakness. You have a fever. Your pain suddenly increases. You have more blood in your urine. Your skin or the white parts of your eyes turn yellow (jaundice). Get help right away if: You have chest pain. You are short of breath. You have irregular heartbeats (palpitations). These symptoms may be an emergency. Get help right away. Call 911. Do not wait to see if the symptoms will go away. Do not drive yourself to the hospital. This information is not intended to replace advice given to you by your health care provider. Make sure you discuss any questions you have with your health care provider. Document Revised: 05/24/2022 Document Reviewed: 05/24/2022 Elsevier Patient Education  2024 [employer].

## 2025-01-10 ENCOUNTER — Ambulatory Visit: Payer: Self-pay | Admitting: Gastroenterology

## 2025-01-10 DIAGNOSIS — K7460 Unspecified cirrhosis of liver: Secondary | ICD-10-CM

## 2025-01-29 NOTE — Telephone Encounter (Signed)
 Pt was made aware and verbalized understanding. F/u appt was made for 1 week after lab orders are scheduled.

## 2025-04-23 ENCOUNTER — Ambulatory Visit: Admitting: Gastroenterology
